# Patient Record
Sex: Female | Born: 1965 | Race: Black or African American | Hispanic: No | Smoking: Never smoker
Health system: Southern US, Community
[De-identification: ages and names within clinical notes are randomized; demographics above are authoritative.]

## PROBLEM LIST (undated history)

## (undated) ENCOUNTER — Ambulatory Visit (HOSPITAL_COMMUNITY): Admission: EM | Disposition: A | Discharge status: Other Acute Inpatient Hospital | Payer: BLUE CROSS/BLUE SHIELD

## (undated) DIAGNOSIS — F329 Major depressive disorder, single episode, unspecified: Secondary | ICD-10-CM

## (undated) DIAGNOSIS — F319 Bipolar disorder, unspecified: Secondary | ICD-10-CM

## (undated) DIAGNOSIS — F32A Depression, unspecified: Secondary | ICD-10-CM

## (undated) DIAGNOSIS — S83289A Other tear of lateral meniscus, current injury, unspecified knee, initial encounter: Secondary | ICD-10-CM

## (undated) DIAGNOSIS — I10 Essential (primary) hypertension: Secondary | ICD-10-CM

## (undated) DIAGNOSIS — F909 Attention-deficit hyperactivity disorder, unspecified type: Secondary | ICD-10-CM

## (undated) HISTORY — PX: THROAT SURGERY: SHX803

---
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
## 1999-08-21 ENCOUNTER — Emergency Department (HOSPITAL_COMMUNITY): Admission: EM | Admit: 1999-08-21 | Discharge: 1999-08-21 | Payer: Self-pay | Admitting: Emergency Medicine

## 2000-03-09 ENCOUNTER — Emergency Department (HOSPITAL_COMMUNITY): Admission: EM | Admit: 2000-03-09 | Discharge: 2000-03-09 | Payer: Self-pay | Admitting: Emergency Medicine

## 2000-05-23 ENCOUNTER — Emergency Department (HOSPITAL_COMMUNITY): Admission: EM | Admit: 2000-05-23 | Discharge: 2000-05-23 | Payer: Self-pay

## 2000-08-13 ENCOUNTER — Emergency Department (HOSPITAL_COMMUNITY): Admission: EM | Admit: 2000-08-13 | Discharge: 2000-08-13 | Payer: Self-pay | Admitting: Emergency Medicine

## 2001-02-13 ENCOUNTER — Emergency Department (HOSPITAL_COMMUNITY): Admission: EM | Admit: 2001-02-13 | Discharge: 2001-02-13 | Payer: Self-pay

## 2001-05-16 ENCOUNTER — Emergency Department (HOSPITAL_COMMUNITY): Admission: EM | Admit: 2001-05-16 | Discharge: 2001-05-16 | Payer: Self-pay | Admitting: *Deleted

## 2001-09-29 ENCOUNTER — Emergency Department (HOSPITAL_COMMUNITY): Admission: EM | Admit: 2001-09-29 | Discharge: 2001-09-29 | Payer: Self-pay | Admitting: *Deleted

## 2003-11-21 ENCOUNTER — Emergency Department (HOSPITAL_COMMUNITY): Admission: EM | Admit: 2003-11-21 | Discharge: 2003-11-21 | Payer: Self-pay | Admitting: Emergency Medicine

## 2004-11-19 ENCOUNTER — Emergency Department (HOSPITAL_COMMUNITY): Admission: EM | Admit: 2004-11-19 | Discharge: 2004-11-19 | Payer: Self-pay | Admitting: Emergency Medicine

## 2004-11-21 ENCOUNTER — Emergency Department (HOSPITAL_COMMUNITY): Admission: EM | Admit: 2004-11-21 | Discharge: 2004-11-21 | Payer: Self-pay | Admitting: Family Medicine

## 2004-12-08 ENCOUNTER — Ambulatory Visit: Payer: Self-pay | Admitting: *Deleted

## 2004-12-08 ENCOUNTER — Emergency Department (HOSPITAL_COMMUNITY): Admission: EM | Admit: 2004-12-08 | Discharge: 2004-12-08 | Payer: Self-pay | Admitting: Family Medicine

## 2004-12-12 ENCOUNTER — Emergency Department (HOSPITAL_COMMUNITY): Admission: EM | Admit: 2004-12-12 | Discharge: 2004-12-12 | Payer: Self-pay | Admitting: Emergency Medicine

## 2004-12-21 ENCOUNTER — Ambulatory Visit: Payer: Self-pay | Admitting: Internal Medicine

## 2005-01-19 ENCOUNTER — Emergency Department (HOSPITAL_COMMUNITY): Admission: EM | Admit: 2005-01-19 | Discharge: 2005-01-19 | Payer: Self-pay | Admitting: Family Medicine

## 2005-02-01 ENCOUNTER — Ambulatory Visit: Payer: Self-pay | Admitting: Family Medicine

## 2005-08-18 ENCOUNTER — Emergency Department (HOSPITAL_COMMUNITY): Admission: EM | Admit: 2005-08-18 | Discharge: 2005-08-18 | Payer: Self-pay | Admitting: Emergency Medicine

## 2005-12-12 ENCOUNTER — Emergency Department (HOSPITAL_COMMUNITY): Admission: EM | Admit: 2005-12-12 | Discharge: 2005-12-12 | Payer: Self-pay | Admitting: Emergency Medicine

## 2005-12-22 ENCOUNTER — Emergency Department (HOSPITAL_COMMUNITY): Admission: EM | Admit: 2005-12-22 | Discharge: 2005-12-22 | Payer: Self-pay | Admitting: Family Medicine

## 2006-04-04 ENCOUNTER — Ambulatory Visit: Payer: Self-pay | Admitting: Family Medicine

## 2006-04-20 ENCOUNTER — Encounter: Admission: RE | Admit: 2006-04-20 | Discharge: 2006-04-20 | Payer: Self-pay | Admitting: Family Medicine

## 2006-07-03 ENCOUNTER — Emergency Department (HOSPITAL_COMMUNITY): Admission: EM | Admit: 2006-07-03 | Discharge: 2006-07-03 | Payer: Self-pay | Admitting: Family Medicine

## 2006-07-31 ENCOUNTER — Emergency Department (HOSPITAL_COMMUNITY): Admission: EM | Admit: 2006-07-31 | Discharge: 2006-07-31 | Payer: Self-pay | Admitting: Emergency Medicine

## 2006-08-02 ENCOUNTER — Emergency Department (HOSPITAL_COMMUNITY): Admission: EM | Admit: 2006-08-02 | Discharge: 2006-08-02 | Payer: Self-pay | Admitting: Emergency Medicine

## 2006-09-04 ENCOUNTER — Emergency Department (HOSPITAL_COMMUNITY): Admission: EM | Admit: 2006-09-04 | Discharge: 2006-09-04 | Payer: Self-pay | Admitting: Family Medicine

## 2006-11-04 ENCOUNTER — Emergency Department (HOSPITAL_COMMUNITY): Admission: EM | Admit: 2006-11-04 | Discharge: 2006-11-04 | Payer: Self-pay | Admitting: Family Medicine

## 2006-12-24 ENCOUNTER — Emergency Department (HOSPITAL_COMMUNITY): Admission: EM | Admit: 2006-12-24 | Discharge: 2006-12-24 | Payer: Self-pay | Admitting: Emergency Medicine

## 2007-02-06 ENCOUNTER — Encounter (INDEPENDENT_AMBULATORY_CARE_PROVIDER_SITE_OTHER): Payer: Self-pay | Admitting: *Deleted

## 2007-06-12 ENCOUNTER — Emergency Department (HOSPITAL_COMMUNITY): Admission: EM | Admit: 2007-06-12 | Discharge: 2007-06-12 | Payer: Self-pay | Admitting: Family Medicine

## 2007-09-04 ENCOUNTER — Encounter: Payer: Self-pay | Admitting: Internal Medicine

## 2007-09-04 ENCOUNTER — Ambulatory Visit: Payer: Self-pay | Admitting: Internal Medicine

## 2007-09-04 LAB — CONVERTED CEMR LAB
Amphetamine Screen, Ur: NEGATIVE
Barbiturate Quant, Ur: NEGATIVE
Benzodiazepines.: NEGATIVE
Chlamydia, DNA Probe: NEGATIVE
Cocaine Metabolites: NEGATIVE
GC Probe Amp, Genital: NEGATIVE
Marijuana Metabolite: NEGATIVE
Methadone: NEGATIVE

## 2007-09-11 ENCOUNTER — Ambulatory Visit (HOSPITAL_COMMUNITY): Admission: RE | Admit: 2007-09-11 | Discharge: 2007-09-11 | Payer: Self-pay | Admitting: Internal Medicine

## 2007-11-01 ENCOUNTER — Emergency Department (HOSPITAL_COMMUNITY): Admission: EM | Admit: 2007-11-01 | Discharge: 2007-11-01 | Payer: Self-pay | Admitting: Family Medicine

## 2008-11-04 ENCOUNTER — Ambulatory Visit: Payer: Self-pay | Admitting: Internal Medicine

## 2008-11-06 ENCOUNTER — Ambulatory Visit: Payer: Self-pay | Admitting: Internal Medicine

## 2010-06-12 ENCOUNTER — Encounter: Payer: Self-pay | Admitting: Internal Medicine

## 2010-08-12 ENCOUNTER — Ambulatory Visit (INDEPENDENT_AMBULATORY_CARE_PROVIDER_SITE_OTHER): Payer: Self-pay

## 2010-08-12 ENCOUNTER — Inpatient Hospital Stay (INDEPENDENT_AMBULATORY_CARE_PROVIDER_SITE_OTHER)
Admission: RE | Admit: 2010-08-12 | Discharge: 2010-08-12 | Disposition: A | Discharge status: Home / Self Care | Payer: Self-pay | Source: Ambulatory Visit | Attending: Emergency Medicine | Admitting: Emergency Medicine

## 2010-08-12 DIAGNOSIS — S139XXA Sprain of joints and ligaments of unspecified parts of neck, initial encounter: Secondary | ICD-10-CM

## 2010-09-22 ENCOUNTER — Inpatient Hospital Stay (INDEPENDENT_AMBULATORY_CARE_PROVIDER_SITE_OTHER)
Admission: RE | Admit: 2010-09-22 | Discharge: 2010-09-22 | Disposition: A | Discharge status: Home / Self Care | Payer: Self-pay | Source: Ambulatory Visit | Attending: Emergency Medicine | Admitting: Emergency Medicine

## 2010-09-22 DIAGNOSIS — L259 Unspecified contact dermatitis, unspecified cause: Secondary | ICD-10-CM

## 2010-10-10 ENCOUNTER — Other Ambulatory Visit (HOSPITAL_COMMUNITY): Payer: Self-pay | Admitting: Family Medicine

## 2010-10-10 ENCOUNTER — Other Ambulatory Visit: Payer: Self-pay | Admitting: Family Medicine

## 2010-10-10 DIAGNOSIS — Z1231 Encounter for screening mammogram for malignant neoplasm of breast: Secondary | ICD-10-CM

## 2010-10-26 ENCOUNTER — Ambulatory Visit (HOSPITAL_COMMUNITY): Payer: Self-pay

## 2011-06-09 ENCOUNTER — Other Ambulatory Visit (HOSPITAL_COMMUNITY): Payer: Self-pay | Admitting: Physician Assistant

## 2011-06-09 DIAGNOSIS — Z1231 Encounter for screening mammogram for malignant neoplasm of breast: Secondary | ICD-10-CM

## 2011-07-06 ENCOUNTER — Ambulatory Visit (HOSPITAL_COMMUNITY)
Admission: RE | Admit: 2011-07-06 | Discharge: 2011-07-06 | Disposition: A | Discharge status: Home / Self Care | Payer: Self-pay | Source: Ambulatory Visit | Attending: Physician Assistant | Admitting: Physician Assistant

## 2011-07-06 DIAGNOSIS — Z1231 Encounter for screening mammogram for malignant neoplasm of breast: Secondary | ICD-10-CM | POA: Insufficient documentation

## 2011-08-06 ENCOUNTER — Encounter (HOSPITAL_COMMUNITY): Payer: Self-pay

## 2011-08-06 ENCOUNTER — Emergency Department (INDEPENDENT_AMBULATORY_CARE_PROVIDER_SITE_OTHER)
Admission: EM | Admit: 2011-08-06 | Discharge: 2011-08-06 | Disposition: A | Discharge status: Nursing Home (Includes ICF) | Payer: Self-pay | Source: Home / Self Care

## 2011-08-06 ENCOUNTER — Encounter (HOSPITAL_COMMUNITY): Payer: Self-pay | Admitting: Emergency Medicine

## 2011-08-06 ENCOUNTER — Emergency Department (HOSPITAL_COMMUNITY)
Admission: EM | Admit: 2011-08-06 | Discharge: 2011-08-06 | Disposition: A | Discharge status: Home / Self Care | Payer: Self-pay | Attending: Emergency Medicine | Admitting: Emergency Medicine

## 2011-08-06 DIAGNOSIS — N611 Abscess of the breast and nipple: Secondary | ICD-10-CM

## 2011-08-06 DIAGNOSIS — N61 Mastitis without abscess: Secondary | ICD-10-CM | POA: Insufficient documentation

## 2011-08-06 MED ORDER — CEPHALEXIN 500 MG PO CAPS: 500.0000 mg | ORAL_CAPSULE | Freq: Four times a day (QID) | ORAL | Status: AC

## 2011-08-06 MED ORDER — TRAMADOL HCL 50 MG PO TABS: 50.0000 mg | ORAL_TABLET | Freq: Four times a day (QID) | ORAL | Status: AC | PRN

## 2011-08-06 MED ORDER — MINOCYCLINE HCL 100 MG PO CAPS: 100.0000 mg | ORAL_CAPSULE | Freq: Two times a day (BID) | ORAL | Status: AC

## 2011-08-06 NOTE — ED Provider Notes (Signed)
History     CSN: [id number]  Arrival date & time 08/06/11  7829   First MD Initiated Contact with Patient 08/06/11 2012      Chief Complaint  Patient presents with  . Recurrent Skin Infections    Left nipple    (Consider location/radiation/quality/duration/timing/severity/associated sxs/prior treatment) Patient is a 46 y.o. female presenting with abscess.  Abscess  This is a recurrent problem. The current episode started yesterday. The problem occurs continuously. The problem has been gradually worsening. The problem is mild. The abscess is characterized by redness and painfulness. The abscess first occurred at home. Pertinent negatives include no fever.   patient reports onset of what she believes to be an abscess to her left nipple yesterday.Marland Kitchen Has become increasingly painful and has not responded to warm compresses at home. The patient was seen at urgent care today and sent here for further evaluation.  History reviewed. No pertinent past medical history.  History reviewed. No pertinent past surgical history.  No family history on file.  History  Substance Use Topics  . Smoking status: Never Smoker   . Smokeless tobacco: Not on file  . Alcohol Use: No    OB History    Grav Para Term Preterm Abortions TAB SAB Ect Mult Living                  Review of Systems  Constitutional: Negative.  Negative for fever.  HENT: Negative.   Eyes: Negative.   Respiratory: Negative.   Cardiovascular: Negative.   Gastrointestinal: Negative.   Genitourinary: Negative.   Musculoskeletal: Negative.   Skin: Negative.   Neurological: Negative.   Hematological: Negative.   Psychiatric/Behavioral: Negative.     Allergies  Review of patient's allergies indicates no known allergies.  Home Medications   Current Outpatient Rx  Name Route Sig Dispense Refill  . ASPIRIN 325 MG PO TABS Oral Take 325 mg by mouth daily.    Carma Leaven M PLUS PO TABS Oral Take 1 tablet by mouth daily.       BP 154/107  Pulse 78  Temp(Src) 98 F (36.7 C) (Oral)  Resp 18  SpO2 96%  Physical Exam  Constitutional: She is oriented to person, place, and time. She appears well-developed and well-nourished.  HENT:  Head: Normocephalic and atraumatic.  Eyes: Conjunctivae are normal.  Neck: Neck supple.  Cardiovascular: Normal rate.   Pulmonary/Chest: Effort normal.  Genitourinary: There is breast tenderness. No breast discharge.       (L) nipple noted to be mildly swollen with associated erythema. There is also mild erythema to areola and well. There is no firm, indurated areas noted and the only area of mild fluctuance is the nipple itself. Findings c/w early abscess to (L) nipple/areola.  Musculoskeletal: Normal range of motion.  Neurological: She is alert and oriented to person, place, and time.  Skin: Skin is warm and dry. No erythema.  Psychiatric: She has a normal mood and affect.    ED Course  Procedures   Findings and clinical impression discussed with patient. Given the location of findings I discussed patient with Dr. Ignacia Palma. Will plan for discharge home on medicine and Keflex and provide surgical referral for follow up. Patient agreeable with plan.    1. Abscess of left breast       MDM  HPI/PE and clinical findings c/w 1. Early abscess and cellulitis of (L) nipple/areola (Given location, will tx w/ Keflex and Minocin and provide sutrgical f/u)    Medical  screening examination/treatment/procedure(s) were performed by non-physician practitioner and as supervising physician I was immediately available for consultation/collaboration. Case and management discussed with Mrs. Wellsite geologist. Osvaldo Human, M.D.     Roma Kayser Schorr, NP 08/06/11 7829  Carleene Cooper III, MD 08/08/11 [phone]

## 2011-08-06 NOTE — Discharge Instructions (Signed)
Please review the instructions below. Because of the area in which your abscess is located we are treating with antibiotics. Continue warm compresses for 20-30 minutes several times a day. Take pain medicine as directed if you need it. If this abscess/cellulitis persist you will need to arrange follow up with a surgeon for further evaluation. Return for worsening symptoms.

## 2011-08-06 NOTE — ED Provider Notes (Signed)
History     CSN: [id number]  Arrival date & time 08/06/11  1804   None     Chief Complaint  Patient presents with  . Recurrent Skin Infections    abcess on left breast    (Consider location/radiation/quality/duration/timing/severity/associated sxs/prior treatment) HPI Comments: Pt noticed soreness in L nipple area of breast on 3/15.  Has progressively worsened.  Pt thinks she has abscess.   Patient is a 46 y.o. female presenting with abscess. The history is provided by the patient.  Abscess  This is a new problem. Episode onset: 2 days ago. The problem has been gradually worsening. Affected Location: L breast/nipple. The problem is moderate. The abscess is characterized by redness and painfulness. Pertinent negatives include no fever.    History reviewed. No pertinent past medical history.  History reviewed. No pertinent past surgical history.  History reviewed. No pertinent family history.  History  Substance Use Topics  . Smoking status: Never Smoker   . Smokeless tobacco: Not on file  . Alcohol Use: No    OB History    Grav Para Term Preterm Abortions TAB SAB Ect Mult Living                  Review of Systems  Constitutional: Negative for fever and chills.  Skin: Positive for color change.    Allergies  Review of patient's allergies indicates no known allergies.  Home Medications   Current Outpatient Rx  Name Route Sig Dispense Refill  . ASPIRIN 325 MG PO TABS Oral Take 325 mg by mouth daily.    Carma Leaven M PLUS PO TABS Oral Take 1 tablet by mouth daily.      BP 144/97  Pulse 78  Temp(Src) 97.7 F (36.5 C) (Oral)  Resp 18  SpO2 95%  Physical Exam  Constitutional: She appears well-developed and well-nourished. No distress.  Pulmonary/Chest: Effort normal. Left breast exhibits skin change and tenderness. Left breast exhibits no nipple discharge.    Skin:       Skin of L nipple/areola area slightly red, warm to touch    ED Course  Procedures  (including critical care time)  Labs Reviewed - No data to display No results found.   1. Breast abscess       MDM  Concern for nipple abscess; transfer to ED.        Cathlyn Parsons, NP 08/06/11 2006

## 2011-08-06 NOTE — ED Notes (Signed)
Report given to [facility], triage nurse. Pt transported by shuttle.

## 2011-08-06 NOTE — ED Notes (Signed)
Pt states abcess appeared last night on left breast, has used heat without relief.

## 2011-08-06 NOTE — ED Notes (Signed)
Pt transferred from urgent care for further eval of abcess to left nipple.

## 2011-08-07 NOTE — ED Provider Notes (Signed)
Medical screening examination/treatment/procedure(s) were performed by non-physician practitioner and as supervising physician I was immediately available for consultation/collaboration.   [facility]; MD   Sharin Grave, MD 08/07/11 2258

## 2013-04-28 ENCOUNTER — Ambulatory Visit: Payer: Self-pay | Attending: Internal Medicine | Admitting: Internal Medicine

## 2013-04-28 ENCOUNTER — Encounter: Payer: Self-pay | Admitting: Internal Medicine

## 2013-04-28 VITALS — BP 148/89 | HR 65 | Temp 98.3°F | Resp 16 | Ht 70.0 in | Wt 189.0 lb

## 2013-04-28 DIAGNOSIS — Z0000 Encounter for general adult medical examination without abnormal findings: Secondary | ICD-10-CM | POA: Insufficient documentation

## 2013-04-28 DIAGNOSIS — I10 Essential (primary) hypertension: Secondary | ICD-10-CM | POA: Insufficient documentation

## 2013-04-28 DIAGNOSIS — Z124 Encounter for screening for malignant neoplasm of cervix: Secondary | ICD-10-CM

## 2013-04-28 DIAGNOSIS — F909 Attention-deficit hyperactivity disorder, unspecified type: Secondary | ICD-10-CM

## 2013-04-28 LAB — CMP AND LIVER
ALT: 19 U/L (ref 0–35)
AST: 23 U/L (ref 0–37)
Albumin: 4.1 g/dL (ref 3.5–5.2)
Bilirubin, Direct: 0.1 mg/dL (ref 0.0–0.3)
CO2: 27 mEq/L (ref 19–32)
Calcium: 9.3 mg/dL (ref 8.4–10.5)
Chloride: 108 mEq/L (ref 96–112)
Creat: 1.12 mg/dL — ABNORMAL HIGH (ref 0.50–1.10)
Potassium: 4 mEq/L (ref 3.5–5.3)

## 2013-04-28 LAB — CBC WITH DIFFERENTIAL/PLATELET
Basophils Absolute: 0 10*3/uL (ref 0.0–0.1)
Basophils Relative: 0 % (ref 0–1)
Eosinophils Absolute: 0.1 10*3/uL (ref 0.0–0.7)
Eosinophils Relative: 1 % (ref 0–5)
MCH: 29.8 pg (ref 26.0–34.0)
MCHC: 33.7 g/dL (ref 30.0–36.0)
MCV: 88.6 fL (ref 78.0–100.0)
Neutrophils Relative %: 47 % (ref 43–77)
Platelets: 255 10*3/uL (ref 150–400)
RDW: 15 % (ref 11.5–15.5)

## 2013-04-28 LAB — LIPID PANEL
Cholesterol: 145 mg/dL (ref 0–200)
Total CHOL/HDL Ratio: 2.2 Ratio

## 2013-04-28 MED ORDER — CLONIDINE HCL 0.1 MG PO TABS: 0.1000 mg | ORAL_TABLET | Freq: Two times a day (BID) | ORAL | Status: DC

## 2013-04-28 NOTE — Progress Notes (Signed)
Patient ID: Brandi Collins, female   DOB: August 23, 1965, 47 y.o.   MRN: [id number] Patient Demographics  Zahara Rembert, is a 47 y.o. female  WUJ:[id number]  [id number]  DOB - [birth year]  CC: No chief complaint on file.      HPI: Shiesha Jahn is a 47 y.o. female here today to establish medical care. Patient claims that she has been very hyperactive ever since childhood and she used to be on medication for ADHD as a child. Now she is thinking she may need medication for adult ADHD. She also wants a general physical examination done today and a Pap smear. She does not have any significant past medical history. She has no other complaint today. She does not smoke cigarette she does not drink alcohol. Patient has No headache, No chest pain, No abdominal pain - No Nausea, No new weakness tingling or numbness, No Cough - SOB.  No Known Allergies History reviewed. No pertinent past medical history. Current Outpatient Prescriptions on File Prior to Visit  Medication Sig Dispense Refill  . aspirin 325 MG tablet Take 325 mg by mouth daily.      . Multiple Vitamins-Minerals (MULTIVITAMINS THER. W/MINERALS) TABS Take 1 tablet by mouth daily.       No current facility-administered medications on file prior to visit.   Family History  Problem Relation Age of Onset  . Hypertension Mother   . Heart disease Mother   . Stroke Mother   . Hypertension Daughter    History   Social History  . Marital Status: Single    Spouse Name: N/A    Number of Children: N/A  . Years of Education: N/A   Occupational History  . Not on file.   Social History Main Topics  . Smoking status: Never Smoker   . Smokeless tobacco: Not on file  . Alcohol Use: No  . Drug Use: No  . Sexual Activity:    Other Topics Concern  . Not on file   Social History Narrative  . No narrative on file    Review of Systems: Constitutional: Negative for fever, chills, diaphoresis, activity change, appetite change and  fatigue. HENT: Negative for ear pain, nosebleeds, congestion, facial swelling, rhinorrhea, neck pain, neck stiffness and ear discharge.  Eyes: Negative for pain, discharge, redness, itching and visual disturbance. Respiratory: Negative for cough, choking, chest tightness, shortness of breath, wheezing and stridor.  Cardiovascular: Negative for chest pain, palpitations and leg swelling. Gastrointestinal: Negative for abdominal distention. Genitourinary: Negative for dysuria, urgency, frequency, hematuria, flank pain, decreased urine volume, difficulty urinating and dyspareunia.  Musculoskeletal: Negative for back pain, joint swelling, arthralgia and gait problem. Neurological: Negative for dizziness, tremors, seizures, syncope, facial asymmetry, speech difficulty, weakness, light-headedness, numbness and headaches.  Hematological: Negative for adenopathy. Does not bruise/bleed easily. Psychiatric/Behavioral: Negative for hallucinations, behavioral problems, confusion, dysphoric mood, decreased concentration and agitation.    Objective:   Filed Vitals:   04/28/13 1410  BP: 148/89  Pulse: 65  Temp: 98.3 F (36.8 C)  Resp: 16    Physical Exam: Constitutional: Patient appears well-developed and well-nourished. No distress. HENT: Normocephalic, atraumatic, External right and left ear normal. Oropharynx is clear and moist.  Eyes: Conjunctivae and EOM are normal. PERRLA, no scleral icterus. Neck: Normal ROM. Neck supple. No JVD. No tracheal deviation. No thyromegaly. CVS: RRR, S1/S2 +, no murmurs, no gallops, no carotid bruit.  Pulmonary: Effort and breath sounds normal, no stridor, rhonchi, wheezes, rales.  Abdominal: Soft. BS +, no distension, tenderness,  rebound or guarding.  Musculoskeletal: Normal range of motion. No edema and no tenderness.  Lymphadenopathy: No lymphadenopathy noted, cervical, inguinal or axillary Neuro: Alert. Normal reflexes, muscle tone coordination. No cranial  nerve deficit. Skin: Skin is warm and dry. No rash noted. Not diaphoretic. No erythema. No pallor. Psychiatric: Normal mood and affect. Behavior, judgment, thought content normal. Pelvic examination: Normal female external genitalia, central cervix, negative cervical motion tenderness No results found for this basename: WBC, HGB, HCT, MCV, PLT   No results found for this basename: CREATININE, BUN, NA, K, CL, CO2    No results found for this basename: HGBA1C   Lipid Panel  No results found for this basename: chol, trig, hdl, cholhdl, vldl, ldlcalc       Assessment and plan:   1. Annual physical exam  - CMP and Liver - CBC with Differential - Lipid panel - Urinalysis, Complete - TSH  2. Pap smear for cervical cancer screening  - Cytology - PAP  3. Hyperactive adult syndrome  - cloNIDine (CATAPRES) 0.1 MG tablet; Take 1 tablet (0.1 mg total) by mouth 2 (two) times daily.  Dispense: 60 tablet; Refill: 3  4. High blood pressure  - cloNIDine (CATAPRES) 0.1 MG tablet; Take 1 tablet (0.1 mg total) by mouth 2 (two) times daily.  Dispense: 60 tablet; Refill: 3  Patient has been counseled extensively about nutrition and exercise  Follow up in 3 months or when necessary   The patient was given clear instructions to go to ER or return to medical center if symptoms don't improve, worsen or new problems develop. The patient verbalized understanding. The patient was told to call to get lab results if they haven't heard anything in the next week.     Jeanann Lewandowsky, MD, MHA, FACP, FAAP [facility], [place] [phone]   04/28/2013, 2:56 PM

## 2013-04-28 NOTE — Progress Notes (Signed)
Pt is here to establish care. Pt reports that she has a knott on her left arm that is now painful to touch. She is requesting a physical, pap smear and a MM.

## 2013-04-28 NOTE — Patient Instructions (Signed)
Clonidine extended release tablets (ADHD)  What is this medicine?  CLONIDINE (KLOE ni deen) is used to treat attention-deficit hyperactivity disorder (ADHD).  This medicine may be used for other purposes; ask your health care provider or pharmacist if you have questions.  COMMON BRAND NAME(S): Kapvay  What should I tell my health care provider before I take this medicine?  They need to know if you have any of these conditions:  -bleeding in the brain  -heart disease  -high or low blood pressure  -history of slow or irregular heartbeat  -kidney disease  -an unusual or allergic reaction to clonidine, other medicines, foods, dyes, or preservatives  -pregnant or trying to get pregnant  -breast-feeding  How should I use this medicine?  Take this medicine by mouth with a glass of water. Follow the directions on the prescription label. Do not cut, crush or chew this medicine. You can take it with or without food. If it upsets your stomach, take it with food. Take your doses at regular intervals. Do not take your medicine more often than directed. Do not suddenly stop taking this medicine. You must gradually reduce the dose. Ask your doctor or health care professional for advice.  Talk to your pediatrician regarding the use of this medicine in children. While this drug may be prescribed for children as young as 6 years for selected conditions, precautions do apply.  Overdosage: If you think you've taken too much of this medicine contact a poison control center or emergency room at once.  Overdosage: If you think you have taken too much of this medicine contact a poison control center or emergency room at once.  NOTE: This medicine is only for you. Do not share this medicine with others.  What if I miss a dose?  If you miss a dose, skip the missed dose. Take the next dose at your regular time. Do not take double or extra doses.  What may interact with this medicine?  Do not take this medicine with any of the following  medications:  -MAOIs like Carbex, Eldepryl, Marplan, Nardil, and Parnate   This medicine may also interact with the following medications:  -alcohol  -certain medicines for seizures like phenobarbital  -certain medicines for blood pressure, heart disease, irregular heart beat  -certain medicines for depression, anxiety, or psychotic disturbances  -medicines for sleep  This list may not describe all possible interactions. Give your health care provider a list of all the medicines, herbs, non-prescription drugs, or dietary supplements you use. Also tell them if you smoke, drink alcohol, or use illegal drugs. Some items may interact with your medicine.  What should I watch for while using this medicine?  Visit your doctor or health care professional for regular checks on your progress. Check your heart rate and blood pressure regularly while you are taking this medicine. Ask your doctor or health care professional what your heart rate should be and when you should contact him or her.  You may get drowsy or dizzy. Do not drive, use machinery, or do anything that needs mental alertness until you know how this medicine affects you. To avoid dizzy or fainting spells, do not stand or sit up quickly. Alcohol can make you more drowsy and dizzy. Avoid alcoholic drinks.  Avoid becoming dehydrated or overheated.  Do not stop taking except on your doctor's advice. You may develop a severe reaction. Your doctor will tell you how much medicine to take.  Your mouth may get dry.   soon as possible: -allergic reactions like skin rash, itching or hives, swelling of the face, lips, or tongue -anxious or change in mood -increased body temperature -low blood pressure -unusually slow heartbeat -unusually weak  or tired  Side effects that usually do not require medical attention (Report these to your doctor or health care professional if they continue or are bothersome.): -constipation -cough, stuffy or runny nose, sore throat, or sneezing -dry mouth -ear pain -headache -nightmares or trouble sleeping -tiredness This list may not describe all possible side effects. Call your doctor for medical advice about side effects. You may report side effects to FDA at 1-800-FDA-1088. Where should I keep my medicine? Keep out of the reach of children. Store at room temperature between 20 and 25 degrees C (68 and 77 degrees F). Protect from light. Keep container tightly closed. Throw away any unused medicine after the expiration date. NOTE: This sheet is a summary. It may not cover all possible information. If you have questions about this medicine, talk to your doctor, pharmacist, or health care provider.  2014, Elsevier/Gold Standard. (2009-03-31 13:56:16) Attention Deficit Hyperactivity Disorder Attention deficit hyperactivity disorder (ADHD) is a problem with behavior issues based on the way the brain functions (neurobehavioral disorder). It is a common reason for behavior and academic problems in school. CAUSES  The cause of ADHD is unknown in most cases. It may run in families. It sometimes can be associated with learning disabilities and other behavioral problems. SYMPTOMS  There are 3 types of ADHD. The 3 types and some of the symptoms include:  Inattentive  Gets bored or distracted easily.  Loses or forgets things. Forgets to hand in homework.  Has trouble organizing or completing tasks.  Difficulty staying on task.  An inability to organize daily tasks and school work.  Leaving projects, chores, or homework unfinished.  Trouble paying attention or responding to details. Careless mistakes.  Difficulty following directions. Often seems like is not listening.  Dislikes activities that  require sustained attention (like chores or homework).  Hyperactive-impulsive  Feels like it is impossible to sit still or stay in a seat. Fidgeting with hands and feet.  Trouble waiting turn.  Talking too much or out of turn. Interruptive.  Speaks or acts impulsively.  Aggressive, disruptive behavior.  Constantly busy or on the go, noisy.  Combined  Has symptoms of both of the above. Often children with ADHD feel discouraged about themselves and with school. They often perform well below their abilities in school. These symptoms can cause problems in home, school, and in relationships with peers. As children get older, the excess motor activities can calm down, but the problems with paying attention and staying organized persist. Most children do not outgrow ADHD but with good treatment can learn to cope with the symptoms. DIAGNOSIS  When ADHD is suspected, the diagnosis should be made by professionals trained in ADHD.  Diagnosis will include:  Ruling out other reasons for the child's behavior.  The caregivers will check with the child's school and check their medical records.  They will talk to teachers and parents.  Behavior rating scales for the child will be filled out by those dealing with the child on a daily basis. A diagnosis is made only after all information has been considered. TREATMENT  Treatment usually includes behavioral treatment often along with medicines. It may include stimulant medicines. The stimulant medicines decrease impulsivity and hyperactivity and increase attention. Other medicines used include antidepressants and certain blood pressure medicines. Most  experts agree that treatment for ADHD should address all aspects of the child's functioning. Treatment should not be limited to the use of medicines alone. Treatment should include structured classroom management. The parents must receive education to address rewarding good behavior, discipline, and  limit-setting. Tutoring or behavioral therapy or both should be available for the child. If untreated, the disorder can have long-term serious effects into adolescence and adulthood. HOME CARE INSTRUCTIONS   Often with ADHD there is a lot of frustration among the family in dealing with the illness. There is often blame and anger that is not warranted. This is a life long illness. There is no way to prevent ADHD. In many cases, because the problem affects the family as a whole, the entire family may need help. A therapist can help the family find better ways to handle the disruptive behaviors and promote change. If the child is young, most of the therapist's work is with the parents. Parents will learn techniques for coping with and improving their child's behavior. Sometimes only the child with the ADHD needs counseling. Your caregivers can help you make these decisions.  Children with ADHD may need help in organizing. Some helpful tips include:  Keep routines the same every day from wake-up time to bedtime. Schedule everything. This includes homework and playtime. This should include outdoor and indoor recreation. Keep the schedule on the refrigerator or a bulletin board where it is frequently seen. Mark schedule changes as far in advance as possible.  Have a place for everything and keep everything in its place. This includes clothing, backpacks, and school supplies.  Encourage writing down assignments and bringing home needed books.  Offer your child a well-balanced diet. Breakfast is especially important for school performance. Children should avoid drinks with caffeine including:  Soft drinks.  Coffee.  Tea.  However, some older children (adolescents) may find these drinks helpful in improving their attention.  Children with ADHD need consistent rules that they can understand and follow. If rules are followed, give small rewards. Children with ADHD often receive, and expect, criticism.  Look for good behavior and praise it. Set realistic goals. Give clear instructions. Look for activities that can foster success and self-esteem. Make time for pleasant activities with your child. Give lots of affection.  Parents are their children's greatest advocates. Learn as much as possible about ADHD. This helps you become a stronger and better advocate for your child. It also helps you educate your child's teachers and instructors if they feel inadequate in these areas. Parent support groups are often helpful. A national group with local chapters is called CHADD (Children and Adults with Attention Deficit Hyperactivity Disorder). PROGNOSIS  There is no cure for ADHD. Children with the disorder seldom outgrow it. Many find adaptive ways to accommodate the ADHD as they mature. SEEK MEDICAL CARE IF:  Your child has repeated muscle twitches, cough or speech outbursts.  Your child has sleep problems.  Your child has a marked loss of appetite.  Your child develops depression.  Your child has new or worsening behavioral problems.  Your child develops dizziness.  Your child has a racing heart.  Your child has stomach pains.  Your child develops headaches. Document Released: 04/28/2002 Document Revised: 07/31/2011 Document Reviewed: 11/27/2012 [employer] [facility] Patient Information 2014 [place], [place].

## 2013-04-29 LAB — URINALYSIS, COMPLETE
Crystals: NONE SEEN
Glucose, UA: NEGATIVE mg/dL
Leukocytes, UA: NEGATIVE
Protein, ur: NEGATIVE mg/dL
Specific Gravity, Urine: 1.014 (ref 1.005–1.030)
Squamous Epithelial / LPF: NONE SEEN
pH: 7.5 (ref 5.0–8.0)

## 2013-05-06 ENCOUNTER — Telehealth: Payer: Self-pay | Admitting: *Deleted

## 2013-05-06 NOTE — Telephone Encounter (Signed)
Contacted pt to inform her that her results are all normal and her Pap smear is negative for malignancy. Pt requests a referral for a mammogram and a psychiatrist. Pt was also informed that Arna Medici will be completed those referrals for her. Telephone call completed with no problems.

## 2013-05-30 ENCOUNTER — Ambulatory Visit: Payer: No Typology Code available for payment source | Attending: Internal Medicine

## 2013-06-16 ENCOUNTER — Telehealth: Payer: Self-pay | Admitting: Emergency Medicine

## 2013-06-16 ENCOUNTER — Telehealth: Payer: Self-pay | Admitting: Internal Medicine

## 2013-06-16 DIAGNOSIS — F909 Attention-deficit hyperactivity disorder, unspecified type: Secondary | ICD-10-CM

## 2013-06-16 NOTE — Telephone Encounter (Signed)
Left message for pt to call clinic

## 2013-06-16 NOTE — Telephone Encounter (Signed)
Pt received results for tests and says she would like referrals to have a mammogram, a psychiatrist (for ADHD), and dental clinic. She says she was waiting for results on tests before asking for referrals.  Please f/u with pt.

## 2013-06-16 NOTE — Telephone Encounter (Signed)
Spoke with pt and gave referral information.

## 2013-06-16 NOTE — Telephone Encounter (Signed)
Left message to call clinic. Referral placed for Psychiatry and mammogram per Dr. Hyman HopesJegede

## 2013-07-28 ENCOUNTER — Ambulatory Visit: Payer: Self-pay | Admitting: Internal Medicine

## 2013-08-04 ENCOUNTER — Ambulatory Visit: Payer: No Typology Code available for payment source | Attending: Internal Medicine

## 2013-08-06 ENCOUNTER — Other Ambulatory Visit: Payer: Self-pay

## 2013-08-06 ENCOUNTER — Ambulatory Visit
Admission: RE | Admit: 2013-08-06 | Discharge: 2013-08-06 | Disposition: A | Discharge status: Home / Self Care | Payer: No Typology Code available for payment source | Source: Ambulatory Visit | Attending: Internal Medicine | Admitting: Internal Medicine

## 2013-08-06 ENCOUNTER — Ambulatory Visit
Admission: RE | Admit: 2013-08-06 | Discharge: 2013-08-06 | Disposition: A | Discharge status: Home / Self Care | Payer: No Typology Code available for payment source | Source: Ambulatory Visit

## 2013-08-06 DIAGNOSIS — Z1231 Encounter for screening mammogram for malignant neoplasm of breast: Secondary | ICD-10-CM

## 2013-08-06 DIAGNOSIS — F909 Attention-deficit hyperactivity disorder, unspecified type: Secondary | ICD-10-CM

## 2013-09-03 ENCOUNTER — Telehealth: Payer: Self-pay | Admitting: *Deleted

## 2013-09-03 ENCOUNTER — Telehealth: Payer: Self-pay | Admitting: Internal Medicine

## 2013-09-03 NOTE — Telephone Encounter (Signed)
Pt calling upset, says that since she has been taking meds her eyes have started crossing. Please f/u with pt.

## 2013-09-04 ENCOUNTER — Encounter: Payer: Self-pay | Admitting: Internal Medicine

## 2013-09-04 ENCOUNTER — Ambulatory Visit: Payer: No Typology Code available for payment source | Attending: Internal Medicine | Admitting: Internal Medicine

## 2013-09-04 ENCOUNTER — Ambulatory Visit (HOSPITAL_COMMUNITY): Payer: No Typology Code available for payment source

## 2013-09-04 VITALS — BP 157/90 | HR 78 | Temp 98.2°F | Wt 184.2 lb

## 2013-09-04 DIAGNOSIS — H53469 Homonymous bilateral field defects, unspecified side: Secondary | ICD-10-CM | POA: Insufficient documentation

## 2013-09-04 DIAGNOSIS — H5500 Unspecified nystagmus: Secondary | ICD-10-CM | POA: Insufficient documentation

## 2013-09-04 DIAGNOSIS — F1410 Cocaine abuse, uncomplicated: Secondary | ICD-10-CM | POA: Insufficient documentation

## 2013-09-04 DIAGNOSIS — Z7982 Long term (current) use of aspirin: Secondary | ICD-10-CM | POA: Insufficient documentation

## 2013-09-04 DIAGNOSIS — F909 Attention-deficit hyperactivity disorder, unspecified type: Secondary | ICD-10-CM | POA: Insufficient documentation

## 2013-09-04 DIAGNOSIS — F1210 Cannabis abuse, uncomplicated: Secondary | ICD-10-CM | POA: Insufficient documentation

## 2013-09-04 DIAGNOSIS — I10 Essential (primary) hypertension: Secondary | ICD-10-CM | POA: Insufficient documentation

## 2013-09-04 LAB — CBC WITH DIFFERENTIAL/PLATELET
Basophils Absolute: 0 10*3/uL (ref 0.0–0.1)
Basophils Relative: 0 % (ref 0–1)
EOS ABS: 0.1 10*3/uL (ref 0.0–0.7)
EOS PCT: 1 % (ref 0–5)
HEMATOCRIT: 45.2 % (ref 36.0–46.0)
HEMOGLOBIN: 14.9 g/dL (ref 12.0–15.0)
LYMPHS ABS: 3.2 10*3/uL (ref 0.7–4.0)
LYMPHS PCT: 50 % — AB (ref 12–46)
MCH: 28.9 pg (ref 26.0–34.0)
MCHC: 33 g/dL (ref 30.0–36.0)
MCV: 87.6 fL (ref 78.0–100.0)
MONO ABS: 0.5 10*3/uL (ref 0.1–1.0)
MONOS PCT: 8 % (ref 3–12)
Neutro Abs: 2.6 10*3/uL (ref 1.7–7.7)
Neutrophils Relative %: 41 % — ABNORMAL LOW (ref 43–77)
PLATELETS: 223 10*3/uL (ref 150–400)
RBC: 5.16 MIL/uL — AB (ref 3.87–5.11)
RDW: 15.4 % (ref 11.5–15.5)
WBC: 6.3 10*3/uL (ref 4.0–10.5)

## 2013-09-04 LAB — COMPLETE METABOLIC PANEL WITH GFR
ALK PHOS: 69 U/L (ref 39–117)
ALT: 16 U/L (ref 0–35)
AST: 16 U/L (ref 0–37)
Albumin: 4.5 g/dL (ref 3.5–5.2)
BUN: 6 mg/dL (ref 6–23)
CALCIUM: 9.5 mg/dL (ref 8.4–10.5)
CHLORIDE: 106 meq/L (ref 96–112)
CO2: 32 mEq/L (ref 19–32)
CREATININE: 0.91 mg/dL (ref 0.50–1.10)
GFR, Est African American: 86 mL/min
GFR, Est Non African American: 74 mL/min
Glucose, Bld: 81 mg/dL (ref 70–99)
Potassium: 4.3 mEq/L (ref 3.5–5.3)
Sodium: 144 mEq/L (ref 135–145)
Total Bilirubin: 0.3 mg/dL (ref 0.2–1.2)
Total Protein: 7.2 g/dL (ref 6.0–8.3)

## 2013-09-04 MED ORDER — ASPIRIN EC 81 MG PO TBEC: 81.0000 mg | DELAYED_RELEASE_TABLET | Freq: Every day | ORAL | Status: DC

## 2013-09-04 MED ORDER — HYDROCHLOROTHIAZIDE 12.5 MG PO TABS: 12.5000 mg | ORAL_TABLET | Freq: Every day | ORAL | Status: DC

## 2013-09-04 NOTE — Progress Notes (Signed)
Subjective:     Patient ID: Brandi Collins, female   DOB: [birth year], 48 y.o.   MRN: [id number]  HPIpatient here for possible reaction to clonodine   Review of Systems     Objective:   Physical Exam     Assessment:       Plan:

## 2013-09-04 NOTE — Progress Notes (Signed)
   Subjective:    Patient ID: Brandi Collins, female    DOB: 12-22-1965, 48 y.o.   MRN: [id number]  HPI    Review of Systems     Objective:   Physical Exam        Assessment & Plan:

## 2013-09-04 NOTE — Progress Notes (Signed)
Patient is here today for a possible reaction to colondine. She has not taken it for 2 days now. Since taking the medication she has been having "eye crossing" and knee and leg pain and cramping. No SOB. She will like to be put ion another type of medication

## 2013-09-04 NOTE — Progress Notes (Signed)
Patient ID: Brandi Collins, female   DOB: [birth year], 48 y.o.   MRN: [id number]   CC:  HPI: 48 year old female with a history of ADHD, hypertension, prior history of marijuana and cocaine use, comes to the clinic for evaluation of her nystagmus. The patient describes left hemianopsia and left-sided nystagmus. Started after the patient was placed on clonidine on 3/26 for ADHD as well as hypertension. Patient has not taken her medication in 2 days. She also complains of feeling tired, no nausea vomiting no headache no blurry vision  No Known Allergies No past medical history on file. Current Outpatient Prescriptions on File Prior to Visit  Medication Sig Dispense Refill  . aspirin 325 MG tablet Take 325 mg by mouth daily.      . Multiple Vitamins-Minerals (MULTIVITAMINS THER. W/MINERALS) TABS Take 1 tablet by mouth daily.       No current facility-administered medications on file prior to visit.   Family History  Problem Relation Age of Onset  . Hypertension Mother   . Heart disease Mother   . Stroke Mother   . Hypertension Daughter    History   Social History  . Marital Status: Single    Spouse Name: N/A    Number of Children: N/A  . Years of Education: N/A   Occupational History  . Not on file.   Social History Main Topics  . Smoking status: Never Smoker   . Smokeless tobacco: Not on file  . Alcohol Use: No  . Drug Use: No  . Sexual Activity:    Other Topics Concern  . Not on file   Social History Narrative  . No narrative on file    Review of Systems  Constitutional: Negative for fever, chills, diaphoresis, activity change, appetite change and fatigue.  HENT: Negative for ear pain, nosebleeds, congestion, facial swelling, rhinorrhea, neck pain, neck stiffness and ear discharge.   Eyes: Negative for pain, discharge, redness, itching and visual disturbance.  Respiratory: Negative for cough, choking, chest tightness, shortness of breath, wheezing and stridor.    Cardiovascular: Negative for chest pain, palpitations and leg swelling.  Gastrointestinal: Negative for abdominal distention.  Genitourinary: Negative for dysuria, urgency, frequency, hematuria, flank pain, decreased urine volume, difficulty urinating and dyspareunia.  Musculoskeletal: Negative for back pain, joint swelling, arthralgias and gait problem.  Neurological: As in history of present illness Hematological: Negative for adenopathy. Does not bruise/bleed easily.  Psychiatric/Behavioral: Negative for hallucinations, behavioral problems, confusion, dysphoric mood, decreased concentration and agitation.    Objective:   Filed Vitals:   09/04/13 1542  BP: 157/90  Pulse: 78  Temp: 98.2 F (36.8 C)    Physical Exam  Constitutional: Appears well-developed and well-nourished. No distress.  HENT: Normocephalic. External right and left ear normal. Oropharynx is clear and moist.  Eyes: Conjunctivae and EOM are normal. PERRLA, no scleral icterus.  Neck: Normal ROM. Neck supple. No JVD. No tracheal deviation. No thyromegaly.  CVS: RRR, S1/S2 +, no murmurs, no gallops, no carotid bruit.  Pulmonary: Effort and breath sounds normal, no stridor, rhonchi, wheezes, rales.  Abdominal: Soft. BS +,  no distension, tenderness, rebound or guarding.  Musculoskeletal: Normal range of motion. No edema and no tenderness.  Lymphadenopathy: No lymphadenopathy noted, cervical, inguinal. Neuro: Alert. Normal reflexes, muscle tone coordination. No cranial nerve deficit. Skin: Skin is warm and dry. No rash noted. Not diaphoretic. No erythema. No pallor.  Psychiatric: Normal mood and affect. Behavior, judgment, thought content normal.   Lab Results  Component Value  Date   WBC 6.7 04/28/2013   HGB 14.1 04/28/2013   HCT 41.9 04/28/2013   MCV 88.6 04/28/2013   PLT 255 04/28/2013   Lab Results  Component Value Date   CREATININE 1.12* 04/28/2013   BUN 10 04/28/2013   NA 142 04/28/2013   K 4.0 04/28/2013   CL  108 04/28/2013   CO2 27 04/28/2013    No results found for this basename: HGBA1C   Lipid Panel     Component Value Date/Time   CHOL 145 04/28/2013 1452   TRIG 51 04/28/2013 1452   HDL 66 04/28/2013 1452   CHOLHDL 2.2 04/28/2013 1452   VLDL 10 04/28/2013 1452   LDLCALC 69 04/28/2013 1452       Assessment and plan:   Patient Active Problem List   Diagnosis Date Noted  . Annual physical exam 04/28/2013  . Pap smear for cervical cancer screening 04/28/2013  . 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 If symptoms worsen the patient has been advised the ER  The patient was given clear instructions to go to ER or return to medical center if symptoms don't improve, worsen or new problems develop. The patient verbalized understanding. The patient was told to call to get any lab results if not heard anything in the next week.

## 2013-09-05 ENCOUNTER — Ambulatory Visit (HOSPITAL_COMMUNITY): Admission: RE | Admit: 2013-09-05 | Payer: No Typology Code available for payment source | Source: Ambulatory Visit

## 2014-07-12 ENCOUNTER — Encounter (HOSPITAL_COMMUNITY): Payer: Self-pay | Admitting: *Deleted

## 2014-07-12 ENCOUNTER — Emergency Department (HOSPITAL_COMMUNITY)
Admission: EM | Admit: 2014-07-12 | Discharge: 2014-07-12 | Disposition: A | Discharge status: Home / Self Care | Payer: Self-pay | Attending: Emergency Medicine | Admitting: Emergency Medicine

## 2014-07-12 DIAGNOSIS — Y92410 Unspecified street and highway as the place of occurrence of the external cause: Secondary | ICD-10-CM | POA: Insufficient documentation

## 2014-07-12 DIAGNOSIS — Y998 Other external cause status: Secondary | ICD-10-CM | POA: Insufficient documentation

## 2014-07-12 DIAGNOSIS — Z79899 Other long term (current) drug therapy: Secondary | ICD-10-CM | POA: Insufficient documentation

## 2014-07-12 DIAGNOSIS — Z7982 Long term (current) use of aspirin: Secondary | ICD-10-CM | POA: Insufficient documentation

## 2014-07-12 DIAGNOSIS — Y9389 Activity, other specified: Secondary | ICD-10-CM | POA: Insufficient documentation

## 2014-07-12 DIAGNOSIS — S161XXA Strain of muscle, fascia and tendon at neck level, initial encounter: Secondary | ICD-10-CM | POA: Insufficient documentation

## 2014-07-12 MED ORDER — DIAZEPAM 5 MG PO TABS: 5.0000 mg | ORAL_TABLET | Freq: Three times a day (TID) | ORAL | Status: DC | PRN

## 2014-07-12 MED ORDER — IBUPROFEN 800 MG PO TABS
800.0000 mg | ORAL_TABLET | Freq: Once | ORAL | Status: AC
  Administered 2014-07-12: 800 mg via ORAL
  Filled 2014-07-12: qty 1

## 2014-07-12 MED ORDER — IBUPROFEN 800 MG PO TABS: 800.0000 mg | ORAL_TABLET | Freq: Three times a day (TID) | ORAL | Status: DC | PRN

## 2014-07-12 MED ORDER — OXYCODONE-ACETAMINOPHEN 5-325 MG PO TABS: 1.0000 | ORAL_TABLET | ORAL | Status: DC | PRN

## 2014-07-12 NOTE — Discharge Instructions (Signed)
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
## 2014-12-17 ENCOUNTER — Emergency Department (HOSPITAL_COMMUNITY)
Admission: EM | Admit: 2014-12-17 | Discharge: 2014-12-17 | Disposition: A | Discharge status: Home / Self Care | Payer: Self-pay | Attending: Emergency Medicine | Admitting: Emergency Medicine

## 2014-12-17 ENCOUNTER — Encounter (HOSPITAL_COMMUNITY): Payer: Self-pay | Admitting: Emergency Medicine

## 2014-12-17 DIAGNOSIS — Z7982 Long term (current) use of aspirin: Secondary | ICD-10-CM | POA: Insufficient documentation

## 2014-12-17 DIAGNOSIS — Z79899 Other long term (current) drug therapy: Secondary | ICD-10-CM | POA: Insufficient documentation

## 2014-12-17 DIAGNOSIS — N390 Urinary tract infection, site not specified: Secondary | ICD-10-CM | POA: Insufficient documentation

## 2014-12-17 LAB — URINALYSIS, ROUTINE W REFLEX MICROSCOPIC
Bilirubin Urine: NEGATIVE
Glucose, UA: NEGATIVE mg/dL
Hgb urine dipstick: NEGATIVE
Ketones, ur: NEGATIVE mg/dL
Nitrite: NEGATIVE
PH: 7 (ref 5.0–8.0)
PROTEIN: NEGATIVE mg/dL
SPECIFIC GRAVITY, URINE: 1.011 (ref 1.005–1.030)
Urobilinogen, UA: 0.2 mg/dL (ref 0.0–1.0)

## 2014-12-17 LAB — URINE MICROSCOPIC-ADD ON

## 2014-12-17 MED ORDER — CEPHALEXIN 250 MG PO CAPS
500.0000 mg | ORAL_CAPSULE | Freq: Once | ORAL | Status: AC
  Administered 2014-12-17: 500 mg via ORAL
  Filled 2014-12-17: qty 2

## 2014-12-17 MED ORDER — CEPHALEXIN 500 MG PO CAPS: 500.0000 mg | ORAL_CAPSULE | Freq: Four times a day (QID) | ORAL | Status: DC

## 2014-12-17 NOTE — ED Notes (Signed)
Pt. Stated, Im having back pain and a burning like I have a UTI, which Ive had before.  I just need an antibiotic

## 2014-12-17 NOTE — ED Provider Notes (Signed)
CSN: [id number]     Arrival date & time 12/17/14  1228 History   First MD Initiated Contact with Patient 12/17/14 1505     Chief Complaint  Patient presents with  . Urinary Tract Infection  . Back Pain     (Consider location/radiation/quality/duration/timing/severity/associated sxs/prior Treatment) HPI   Pt is a 49 y.o female, hx of HTN, who presents to the ER today for 2 days of intermittent right flank pain, which is described as "stinging," and is without radiation.  Patient has not tried any medications to ease the pain, and further denies any dysuria, hematuria, nausea, vomiting, diarrhea, abdominal pain, fever, chills, sweats.  Patient has had no vaginal symptoms, denies any increased vaginal discharge, no itching or burning, no dyspareunia. Pt also denies any chest pain, SOB, but does complain of feeling more tired lately, has had difficulty sleeping with the back pain, and she suspects that she may be dehydrated.  She denies any lightheadedness.  Pt denies any recent trauma to back, has not done any strenuous activity or lifting, denies any lower extremity numbness, tingling, edema, weakness.  History reviewed. No pertinent past medical history. History reviewed. No pertinent past surgical history. Family History  Problem Relation Age of Onset  . Hypertension Mother   . Heart disease Mother   . Stroke Mother   . Hypertension Daughter    History  Substance Use Topics  . Smoking status: Never Smoker   . Smokeless tobacco: Not on file  . Alcohol Use: No   OB History    No data available     Review of Systems  HENT: Negative.   Eyes: Negative.   Respiratory: Negative.   Cardiovascular: Negative.   Gastrointestinal: Negative.   Genitourinary: Negative.   Musculoskeletal: Positive for back pain. Negative for myalgias, joint swelling, arthralgias, gait problem, neck pain and neck stiffness.  Skin: Negative.   Neurological: Negative.   Psychiatric/Behavioral: Negative.     10 Systems reviewed and are negative for acute change except as noted in the HPI.      Allergies  Review of patient's allergies indicates no known allergies.  Home Medications   Prior to Admission medications   Medication Sig Start Date End Date Taking? Authorizing Provider  aspirin 325 MG tablet Take 325 mg by mouth daily.    Historical Provider, MD  aspirin EC 81 MG tablet Take 1 tablet (81 mg total) by mouth daily. Patient not taking: Reported on 07/12/2014 09/04/13   Richarda Overlie, MD  cephALEXin (KEFLEX) 500 MG capsule Take 1 capsule (500 mg total) by mouth 4 (four) times daily. 12/17/14   Danelle Berry, PA-C  diazepam (VALIUM) 5 MG tablet Take 1 tablet (5 mg total) by mouth every 8 (eight) hours as needed for muscle spasms. 07/12/14   Kristen N Ward, DO  hydrochlorothiazide (HYDRODIURIL) 12.5 MG tablet Take 1 tablet (12.5 mg total) by mouth daily. Patient not taking: Reported on 07/12/2014 09/04/13   Richarda Overlie, MD  ibuprofen (ADVIL,MOTRIN) 800 MG tablet Take 1 tablet (800 mg total) by mouth every 8 (eight) hours as needed for mild pain. 07/12/14   Kristen N Ward, DO  Multiple Vitamins-Minerals (MULTIVITAMINS THER. W/MINERALS) TABS Take 1 tablet by mouth daily.    Historical Provider, MD  oxyCODONE-acetaminophen (PERCOCET/ROXICET) 5-325 MG per tablet Take 1 tablet by mouth every 4 (four) hours as needed. 07/12/14   Kristen N Ward, DO   BP 115/71 mmHg  Pulse 77  Temp(Src) 98.5 F (36.9 C) (Oral)  Resp  18  Ht 5\' 9"  (1.753 m)  Wt 166 lb (75.297 kg)  BMI 24.50 kg/m2  SpO2 93% Physical Exam  Constitutional: She is oriented to person, place, and time. Vital signs are normal. She appears well-developed and well-nourished. She does not have a sickly appearance. She does not appear ill. No distress.  HENT:  Head: Normocephalic and atraumatic.  Nose: Nose normal.  Mouth/Throat: Oropharynx is clear and moist. No oropharyngeal exudate.  Eyes: Conjunctivae and EOM are normal. Pupils are  equal, round, and reactive to light. Right eye exhibits no discharge. Left eye exhibits no discharge. No scleral icterus.  Neck: Normal range of motion. No JVD present. No tracheal deviation present. No thyromegaly present.  Cardiovascular: Normal rate, regular rhythm, normal heart sounds and intact distal pulses.  Exam reveals no gallop and no friction rub.   No murmur heard. Pulmonary/Chest: Effort normal and breath sounds normal. No stridor. No respiratory distress. She has no wheezes. She has no rales. She exhibits no tenderness.  Abdominal: Soft. Bowel sounds are normal. She exhibits no distension and no mass. There is no tenderness. There is no rebound and no guarding.  CVA tenderness right side  Musculoskeletal: Normal range of motion. She exhibits no edema or tenderness.  No spinal tenderness, cervical to lumbar, no paraspinal tenderness to palpation Normal range of motion of bilateral hips, bilateral knees, neck, back  Lymphadenopathy:    She has no cervical adenopathy.  Neurological: She is alert and oriented to person, place, and time. She has normal reflexes. No cranial nerve deficit. She exhibits normal muscle tone. Coordination normal.  Skin: Skin is warm and dry. No rash noted. She is not diaphoretic. No erythema. No pallor.  Psychiatric: She has a normal mood and affect. Her behavior is normal. Judgment and thought content normal.  Nursing note and vitals reviewed.   ED Course  Procedures (including critical care time) Labs Review Labs Reviewed  URINALYSIS, ROUTINE W REFLEX MICROSCOPIC (NOT AT [facility]) - Abnormal; Notable for the following:    Leukocytes, UA TRACE (*)    All other components within normal limits  URINE MICROSCOPIC-ADD ON - Abnormal; Notable for the following:    Squamous Epithelial / LPF FEW (*)    Bacteria, UA FEW (*)    All other components within normal limits    Imaging Review No results found.   EKG Interpretation None      MDM   Final  diagnoses:  UTI (lower urinary tract infection)   Medications  cephALEXin (KEFLEX) capsule 500 mg (not administered)   Filed Vitals:   12/17/14 1232 12/17/14 1517  BP: 137/82 115/71  Pulse: 107 77  Temp: 98.5 F (36.9 C)   TempSrc: Oral   Resp: 16 18  Height: 5\' 9"  (1.753 m)   Weight: 166 lb (75.297 kg)   SpO2: 94% 93%    Pt with bacteria/leuks in UA, complaint of right flank pain w/o N/V/Fever or abdominal pain Patient vitals are normal Pt will be given a dose of keflex in ED, cannot fill prescription tonight, is going to work Pt denies being in pain and has stated she will use Tylenol and Aleve for her discomfort.      Danelle Berry, PA-C 12/17/14 1535  Linwood Dibbles, MD 12/17/14 1536

## 2014-12-17 NOTE — Discharge Instructions (Signed)

## 2015-02-18 ENCOUNTER — Ambulatory Visit: Payer: Self-pay

## 2015-03-25 ENCOUNTER — Other Ambulatory Visit: Payer: Self-pay | Admitting: Internal Medicine

## 2015-03-25 ENCOUNTER — Telehealth: Payer: Self-pay

## 2015-03-25 NOTE — Telephone Encounter (Signed)
Nurse called patient on mobile number, reached voicemail. Left message for patient to call Shakeyla Giebler with [facility], at [phone]. Nurse called patient at home number, home number is not a working number.  Refill requested for hydrochlorothiazide received, patient needs appointment, last seen 05/18/13.

## 2015-03-25 NOTE — Telephone Encounter (Signed)
Patient explains she does not have high blood pressure.  Per patient, Provider gave her clonidine for ADD. Patient does not want to refill hydrochlorothiazide at this time. She will discuss medications at her appointment on 04/22/15 with Dr. Hyman HopesJegede.

## 2015-03-25 NOTE — Telephone Encounter (Signed)
error 

## 2015-04-22 ENCOUNTER — Ambulatory Visit: Payer: Self-pay | Attending: Internal Medicine | Admitting: Internal Medicine

## 2015-04-22 ENCOUNTER — Encounter: Payer: Self-pay | Admitting: Internal Medicine

## 2015-04-22 VITALS — BP 114/73 | HR 62 | Temp 98.0°F | Resp 18 | Ht 68.0 in | Wt 164.0 lb

## 2015-04-22 DIAGNOSIS — F909 Attention-deficit hyperactivity disorder, unspecified type: Secondary | ICD-10-CM

## 2015-04-22 DIAGNOSIS — Z0000 Encounter for general adult medical examination without abnormal findings: Secondary | ICD-10-CM | POA: Insufficient documentation

## 2015-04-22 DIAGNOSIS — I10 Essential (primary) hypertension: Secondary | ICD-10-CM

## 2015-04-22 LAB — COMPLETE METABOLIC PANEL WITH GFR
ALT: 19 U/L (ref 6–29)
AST: 19 U/L (ref 10–35)
Albumin: 3.9 g/dL (ref 3.6–5.1)
Alkaline Phosphatase: 70 U/L (ref 33–130)
BILIRUBIN TOTAL: 0.3 mg/dL (ref 0.2–1.2)
BUN: 9 mg/dL (ref 7–25)
CO2: 31 mmol/L (ref 20–31)
CREATININE: 1.02 mg/dL (ref 0.50–1.05)
Calcium: 9.2 mg/dL (ref 8.6–10.4)
Chloride: 105 mmol/L (ref 98–110)
GFR, EST NON AFRICAN AMERICAN: 64 mL/min (ref >=60)
GFR, Est African American: 74 mL/min (ref >=60)
GLUCOSE: 85 mg/dL (ref 65–99)
Potassium: 4.6 mmol/L (ref 3.5–5.3)
SODIUM: 142 mmol/L (ref 135–146)
Total Protein: 6.8 g/dL (ref 6.1–8.1)

## 2015-04-22 LAB — LIPID PANEL
Cholesterol: 148 mg/dL (ref 125–200)
HDL: 69 mg/dL (ref >=46)
LDL Cholesterol: 65 mg/dL (ref <130)
TRIGLYCERIDES: 72 mg/dL (ref <150)
Total CHOL/HDL Ratio: 2.1 Ratio (ref <=5.0)
VLDL: 14 mg/dL (ref <30)

## 2015-04-22 LAB — TSH: TSH: 1.009 u[IU]/mL (ref 0.350–4.500)

## 2015-04-22 MED ORDER — CLONIDINE HCL 0.1 MG PO TABS: 0.1000 mg | ORAL_TABLET | Freq: Two times a day (BID) | ORAL | Status: DC

## 2015-04-22 NOTE — Progress Notes (Signed)
Patient ID: Brandi Collins, female   DOB: 1966-03-19, 49 y.o.   MRN: [id number]   Brandi Collins, is a 49 y.o. female  [id number]  [id number]  DOB - 1966-03-19  No chief complaint on file.       Subjective:   Brandi Collins is a 49 y.o. female here today for a follow up visit. Patient has also been seen in our clinic once in the past, December 2014. Patient claimed she was diagnosed with ADHD from the clinic and will like to be started on medication because she can stay focused. She had reported in the past that her diagnosis was when she was a child, but today she said she was diagnosed recently. She claims that she is very hyperactive, she is all over the place, not able to be focused, and this bothers her. She confessed to smoking marijuana very frequently and takes cocaine occasionally. She denies being depressed but anxious. She denies any suicidal ideation or thoughts. She denies smoking history, she does not drink alcohol. She is not on any medication at the moment. She used to be on clonidine for her ADHD according to her. She has no other complaints today. She also has history of hypertension on hydrochlorothiazide low dose, patient has not been taking it for over a year. Blood pressure has been stable Patient has No headache, No chest pain, No abdominal pain - No Nausea, No new weakness tingling or numbness, No Cough - SOB.  Problem  Health Care Maintenance  Essential Hypertension, Benign    ALLERGIES: No Known Allergies  PAST MEDICAL HISTORY: History reviewed. No pertinent past medical history.  MEDICATIONS AT HOME: Prior to Admission medications   Medication Sig Start Date End Date Taking? Authorizing Provider  aspirin EC 81 MG tablet Take 1 tablet (81 mg total) by mouth daily. 09/04/13  Yes Richarda OverlieNayana Abrol, MD  ibuprofen (ADVIL,MOTRIN) 800 MG tablet Take 1 tablet (800 mg total) by mouth every 8 (eight) hours as needed for mild pain. 07/12/14  Yes Kristen N Ward, DO    Multiple Vitamins-Minerals (MULTIVITAMINS THER. W/MINERALS) TABS Take 1 tablet by mouth daily.   Yes Historical Provider, MD  aspirin 325 MG tablet Take 325 mg by mouth daily.    Historical Provider, MD  cephALEXin (KEFLEX) 500 MG capsule Take 1 capsule (500 mg total) by mouth 4 (four) times daily. Patient not taking: Reported on 04/22/2015 12/17/14   Danelle BerryLeisa Tapia, PA-C  cloNIDine (CATAPRES) 0.1 MG tablet Take 1 tablet (0.1 mg total) by mouth 2 (two) times daily. 04/22/15   Quentin Angstlugbemiga E Berlie Hatchel, MD  diazepam (VALIUM) 5 MG tablet Take 1 tablet (5 mg total) by mouth every 8 (eight) hours as needed for muscle spasms. Patient not taking: Reported on 04/22/2015 07/12/14   Kristen N Ward, DO  hydrochlorothiazide (HYDRODIURIL) 12.5 MG tablet Take 1 tablet (12.5 mg total) by mouth daily. Patient not taking: Reported on 07/12/2014 09/04/13   Richarda OverlieNayana Abrol, MD  oxyCODONE-acetaminophen (PERCOCET/ROXICET) 5-325 MG per tablet Take 1 tablet by mouth every 4 (four) hours as needed. Patient not taking: Reported on 04/22/2015 07/12/14   Layla MawKristen N Ward, DO     Objective:   Filed Vitals:   04/22/15 1045  BP: 114/73  Pulse: 62  Temp: 98 F (36.7 C)  TempSrc: Oral  Resp: 18  Height: 5\' 8"  (1.727 m)  Weight: 164 lb (74.39 kg)  SpO2: 95%    Exam General appearance : Awake, alert, not in any distress. Speech Clear. Not toxic  looking HEENT: Atraumatic and Normocephalic, pupils equally reactive to light and accomodation Neck: supple, no JVD. No cervical lymphadenopathy.  Chest:Good air entry bilaterally, no added sounds  CVS: S1 S2 regular, no murmurs.  Abdomen: Bowel sounds present, Non tender and not distended with no gaurding, rigidity or rebound. Extremities: B/L Lower Ext shows no edema, both legs are warm to touch Neurology: Awake alert, and oriented X 3, CN II-XII intact, Non focal Skin:No Rash  Data Review No results found for: HGBA1C   Assessment & Plan   1. Health care maintenance  - Flu  Vaccine QUAD 36+ mos PF IM (Fluarix & Fluzone Quad PF)  2. Hyperactive adult syndrome  - cloNIDine (CATAPRES) 0.1 MG tablet; Take 1 tablet (0.1 mg total) by mouth 2 (two) times daily.  Dispense: 60 tablet; Refill: 3 - TSH - Drug Screen, Urine  3. Essential hypertension, benign  - cloNIDine (CATAPRES) 0.1 MG tablet; Take 1 tablet (0.1 mg total) by mouth 2 (two) times daily.  Dispense: 60 tablet; Refill: 3 - COMPLETE METABOLIC PANEL WITH GFR - Lipid panel - Urinalysis, Complete  Patient have been counseled extensively about nutrition and exercise  Return in about 6 months (around 10/21/2015) for Routine Follow Up, Annual Physical.  The patient was given clear instructions to go to ER or return to medical center if symptoms don't improve, worsen or new problems develop. The patient verbalized understanding. The patient was told to call to get lab results if they haven't heard anything in the next week.   This note has been created with Education officer, environmental. Any transcriptional errors are unintentional.    Jeanann Lewandowsky, MD, MHA, Maxwell Caul, CPE [facility] and [facility] [place], [place] [phone]   04/22/2015, 11:51 AM

## 2015-04-22 NOTE — Patient Instructions (Signed)
Clonidine extended release tablets (ADHD) What is this medicine? CLONIDINE (KLOE ni deen) is used to treat attention-deficit hyperactivity disorder (ADHD). This medicine may be used for other purposes; ask your health care provider or pharmacist if you have questions. What should I tell my health care provider before I take this medicine? They need to know if you have any of these conditions: -bleeding in the brain -heart disease -high or low blood pressure -history of slow or irregular heartbeat -kidney disease -an unusual or allergic reaction to clonidine, other medicines, foods, dyes, or preservatives -pregnant or trying to get pregnant -breast-feeding How should I use this medicine? Take this medicine by mouth with a glass of water. Follow the directions on the prescription label. Do not cut, crush or chew this medicine. You can take it with or without food. If it upsets your stomach, take it with food. Take your doses at regular intervals. Do not take your medicine more often than directed. Do not suddenly stop taking this medicine. You must gradually reduce the dose. Ask your doctor or health care professional for advice. Talk to your pediatrician regarding the use of this medicine in children. While this drug may be prescribed for children as young as 6 years for selected conditions, precautions do apply. Overdosage: If you think you have taken too much of this medicine contact a poison control center or emergency room at once. NOTE: This medicine is only for you. Do not share this medicine with others. What if I miss a dose? If you miss a dose, skip the missed dose. Take the next dose at your regular time. Do not take double or extra doses. What may interact with this medicine? Do not take this medicine with any of the following medications: -MAOIs like Carbex, Eldepryl, Marplan, Nardil, and Parnate This medicine may also interact with the following medications: -alcohol -certain  medicines for seizures like phenobarbital -certain medicines for blood pressure, heart disease, irregular heart beat -certain medicines for depression, anxiety, or psychotic disturbances -medicines for sleep This list may not describe all possible interactions. Give your health care provider a list of all the medicines, herbs, non-prescription drugs, or dietary supplements you use. Also tell them if you smoke, drink alcohol, or use illegal drugs. Some items may interact with your medicine. What should I watch for while using this medicine? Visit your doctor or health care professional for regular checks on your progress. Check your heart rate and blood pressure regularly while you are taking this medicine. Ask your doctor or health care professional what your heart rate should be and when you should contact him or her. You may get drowsy or dizzy. Do not drive, use machinery, or do anything that needs mental alertness until you know how this medicine affects you. To avoid dizzy or fainting spells, do not stand or sit up quickly. Alcohol can make you more drowsy and dizzy. Avoid alcoholic drinks. Avoid becoming dehydrated or overheated. Do not stop taking except on your doctor's advice. You may develop a severe reaction. Your doctor will tell you how much medicine to take. Your mouth may get dry. Chewing sugarless gum or sucking hard candy, and drinking plenty of water will help. What side effects may I notice from receiving this medicine? Side effects that you should report to your doctor or health care professional as soon as possible: -allergic reactions like skin rash, itching or hives, swelling of the face, lips, or tongue -anxious or change in mood -increased body temperature -  low blood pressure -unusually slow heartbeat -unusually weak or tired Side effects that usually do not require medical attention (Report these to your doctor or health care professional if they continue or are  bothersome.): -constipation -dizziness -dry mouth -headache -loss of appetite -nightmares or trouble sleeping -tiredness This list may not describe all possible side effects. Call your doctor for medical advice about side effects. You may report side effects to FDA at 1-800-FDA-1088. Where should I keep my medicine? Keep out of the reach of children. Store at room temperature between 20 and 25 degrees C (68 and 77 degrees F). Protect from light. Keep container tightly closed. Throw away any unused medicine after the expiration date. NOTE: This sheet is a summary. It may not cover all possible information. If you have questions about this medicine, talk to your doctor, pharmacist, or health care provider.    2016, Elsevier/Gold Standard. (2013-10-29 13:00:01) Attention Deficit Hyperactivity Disorder Attention deficit hyperactivity disorder (ADHD) is a problem with behavior issues based on the way the brain functions (neurobehavioral disorder). It is a common reason for behavior and academic problems in school. SYMPTOMS  There are 3 types of ADHD. The 3 types and some of the symptoms include:  Inattentive.  Gets bored or distracted easily.  Loses or forgets things. Forgets to hand in homework.  Has trouble organizing or completing tasks.  Difficulty staying on task.  An inability to organize daily tasks and school work.  Leaving projects, chores, or homework unfinished.  Trouble paying attention or responding to details. Careless mistakes.  Difficulty following directions. Often seems like is not listening.  Dislikes activities that require sustained attention (like chores or homework).  Hyperactive-impulsive.  Feels like it is impossible to sit still or stay in a seat. Fidgeting with hands and feet.  Trouble waiting turn.  Talking too much or out of turn. Interruptive.  Speaks or acts impulsively.  Aggressive, disruptive behavior.  Constantly busy or on the go;  noisy.  Often leaves seat when they are expected to remain seated.  Often runs or climbs where it is not appropriate, or feels very restless.  Combined.  Has symptoms of both of the above. Often children with ADHD feel discouraged about themselves and with school. They often perform well below their abilities in school. As children get older, the excess motor activities can calm down, but the problems with paying attention and staying organized persist. Most children do not outgrow ADHD but with good treatment can learn to cope with the symptoms. DIAGNOSIS  When ADHD is suspected, the diagnosis should be made by professionals trained in ADHD. This professional will collect information about the individual suspected of having ADHD. Information must be collected from various settings where the person lives, works, or attends school.  Diagnosis will include:  Confirming symptoms began in childhood.  Ruling out other reasons for the child's behavior.  The health care providers will check with the child's school and check their medical records.  They will talk to teachers and parents.  Behavior rating scales for the child will be filled out by those dealing with the child on a daily basis. A diagnosis is made only after all information has been considered. TREATMENT  Treatment usually includes behavioral treatment, tutoring or extra support in school, and stimulant medicines. Because of the way a person's brain works with ADHD, these medicines decrease impulsivity and hyperactivity and increase attention. This is different than how they would work in a person who does not have  ADHD. Other medicines used include antidepressants and certain blood pressure medicines. Most experts agree that treatment for ADHD should address all aspects of the person's functioning. Along with medicines, treatment should include structured classroom management at school. Parents should reward good behavior, provide  constant discipline, and set limits. Tutoring should be available for the child as needed. ADHD is a lifelong condition. If untreated, the disorder can have long-term serious effects into adolescence and adulthood. HOME CARE INSTRUCTIONS   Often with ADHD there is a lot of frustration among family members dealing with the condition. Blame and anger are also feelings that are common. In many cases, because the problem affects the family as a whole, the entire family may need help. A therapist can help the family find better ways to handle the disruptive behaviors of the person with ADHD and promote change. If the person with ADHD is young, most of the therapist's work is with the parents. Parents will learn techniques for coping with and improving their child's behavior. Sometimes only the child with the ADHD needs counseling. Your health care providers can help you make these decisions.  Children with ADHD may need help learning how to organize. Some helpful tips include:  Keep routines the same every day from wake-up time to bedtime. Schedule all activities, including homework and playtime. Keep the schedule in a place where the person with ADHD will often see it. Mark schedule changes as far in advance as possible.  Schedule outdoor and indoor recreation.  Have a place for everything and keep everything in its place. This includes clothing, backpacks, and school supplies.  Encourage writing down assignments and bringing home needed books. Work with your child's teachers for assistance in organizing school work.  Offer your child a well-balanced diet. Breakfast that includes a balance of whole grains, protein, and fruits or vegetables is especially important for school performance. Children should avoid drinks with caffeine including:  Soft drinks.  Coffee.  Tea.  However, some older children (adolescents) may find these drinks helpful in improving their attention. Because it can also be  common for adolescents with ADHD to become addicted to caffeine, talk with your health care provider about what is a safe amount of caffeine intake for your child.  Children with ADHD need consistent rules that they can understand and follow. If rules are followed, give small rewards. Children with ADHD often receive, and expect, criticism. Look for good behavior and praise it. Set realistic goals. Give clear instructions. Look for activities that can foster success and self-esteem. Make time for pleasant activities with your child. Give lots of affection.  Parents are their children's greatest advocates. Learn as much as possible about ADHD. This helps you become a stronger and better advocate for your child. It also helps you educate your child's teachers and instructors if they feel inadequate in these areas. Parent support groups are often helpful. A national group with local chapters is called Children and Adults with Attention Deficit Hyperactivity Disorder (CHADD). SEEK MEDICAL CARE IF:  Your child has repeated muscle twitches, cough, or speech outbursts.  Your child has sleep problems.  Your child has a marked loss of appetite.  Your child develops depression.  Your child has new or worsening behavioral problems.  Your child develops dizziness.  Your child has a racing heart.  Your child has stomach pains.  Your child develops headaches. SEEK IMMEDIATE MEDICAL CARE IF:  Your child has been diagnosed with depression or anxiety and the symptoms seem  to be getting worse.  Your child has been depressed and suddenly appears to have increased energy or motivation.  You are worried that your child is having a bad reaction to a medication he or she is taking for ADHD.   This information is not intended to replace advice given to you by your health care provider. Make sure you discuss any questions you have with your health care provider.   Document Released: 04/28/2002 Document  Revised: 05/13/2013 Document Reviewed: 01/13/2013 Elsevier Interactive Patient Education 2016 [employer]. Hypertension Hypertension, commonly called high blood pressure, is when the force of blood pumping through your arteries is too strong. Your arteries are the blood vessels that carry blood from your heart throughout your body. A blood pressure reading consists of a higher number over a lower number, such as 110/72. The higher number (systolic) is the pressure inside your arteries when your heart pumps. The lower number (diastolic) is the pressure inside your arteries when your heart relaxes. Ideally you want your blood pressure below 120/80. Hypertension forces your heart to work harder to pump blood. Your arteries may become narrow or stiff. Having untreated or uncontrolled hypertension can cause heart attack, stroke, kidney disease, and other problems. RISK FACTORS Some risk factors for high blood pressure are controllable. Others are not.  Risk factors you cannot control include:   Race. You may be at higher risk if you are African American.  Age. Risk increases with age.  Gender. Men are at higher risk than women before age 51 years. After age 43, women are at higher risk than men. Risk factors you can control include:  Not getting enough exercise or physical activity.  Being overweight.  Getting too much fat, sugar, calories, or salt in your diet.  Drinking too much alcohol. SIGNS AND SYMPTOMS Hypertension does not usually cause signs or symptoms. Extremely high blood pressure (hypertensive crisis) may cause headache, anxiety, shortness of breath, and nosebleed. DIAGNOSIS To check if you have hypertension, your health care provider will measure your blood pressure while you are seated, with your arm held at the level of your heart. It should be measured at least twice using the same arm. Certain conditions can cause a difference in blood pressure between your right and left  arms. A blood pressure reading that is higher than normal on one occasion does not mean that you need treatment. If it is not clear whether you have high blood pressure, you may be asked to return on a different day to have your blood pressure checked again. Or, you may be asked to monitor your blood pressure at home for 1 or more weeks. TREATMENT Treating high blood pressure includes making lifestyle changes and possibly taking medicine. Living a healthy lifestyle can help lower high blood pressure. You may need to change some of your habits. Lifestyle changes may include:  Following the DASH diet. This diet is high in fruits, vegetables, and whole grains. It is low in salt, red meat, and added sugars.  Keep your sodium intake below 2,300 mg per day.  Getting at least 30-45 minutes of aerobic exercise at least 4 times per week.  Losing weight if necessary.  Not smoking.  Limiting alcoholic beverages.  Learning ways to reduce stress. Your health care provider may prescribe medicine if lifestyle changes are not enough to get your blood pressure under control, and if one of the following is true:  You are 68-43 years of age and your systolic blood pressure is  above 140.  You are 50 years of age or older, and your systolic blood pressure is above 150.  Your diastolic blood pressure is above 90.  You have diabetes, and your systolic blood pressure is over 140 or your diastolic blood pressure is over 90.  You have kidney disease and your blood pressure is above 140/90.  You have heart disease and your blood pressure is above 140/90. Your personal target blood pressure may vary depending on your medical conditions, your age, and other factors. HOME CARE INSTRUCTIONS  Have your blood pressure rechecked as directed by your health care provider.   Take medicines only as directed by your health care provider. Follow the directions carefully. Blood pressure medicines must be taken as  prescribed. The medicine does not work as well when you skip doses. Skipping doses also puts you at risk for problems.  Do not smoke.   Monitor your blood pressure at home as directed by your health care provider. SEEK MEDICAL CARE IF:   You think you are having a reaction to medicines taken.  You have recurrent headaches or feel dizzy.  You have swelling in your ankles.  You have trouble with your vision. SEEK IMMEDIATE MEDICAL CARE IF:  You develop a severe headache or confusion.  You have unusual weakness, numbness, or feel faint.  You have severe chest or abdominal pain.  You vomit repeatedly.  You have trouble breathing. MAKE SURE YOU:   Understand these instructions.  Will watch your condition.  Will get help right away if you are not doing well or get worse.   This information is not intended to replace advice given to you by your health care provider. Make sure you discuss any questions you have with your health care provider.   Document Released: 05/08/2005 Document Revised: 09/22/2014 Document Reviewed: 02/28/2013 Elsevier Interactive Patient Education 2016 Elsevier Inc. DASH Eating Plan DASH stands for "Dietary Approaches to Stop Hypertension." The DASH eating plan is a healthy eating plan that has been shown to reduce high blood pressure (hypertension). Additional health benefits may include reducing the risk of type 2 diabetes mellitus, heart disease, and stroke. The DASH eating plan may also help with weight loss. WHAT DO I NEED TO KNOW ABOUT THE DASH EATING PLAN? For the DASH eating plan, you will follow these general guidelines:  Choose foods with a percent daily value for sodium of less than 5% (as listed on the food label).  Use salt-free seasonings or herbs instead of table salt or sea salt.  Check with your health care provider or pharmacist before using salt substitutes.  Eat lower-sodium products, often labeled as "lower sodium" or "no salt  added."  Eat fresh foods.  Eat more vegetables, fruits, and low-fat dairy products.  Choose whole grains. Look for the word "whole" as the first word in the ingredient list.  Choose fish and skinless chicken or [place] more often than red meat. Limit fish, poultry, and meat to 6 oz (170 g) each day.  Limit sweets, desserts, sugars, and sugary drinks.  Choose heart-healthy fats.  Limit cheese to 1 oz (28 g) per day.  Eat more home-cooked food and less restaurant, buffet, and fast food.  Limit fried foods.  Cook foods using methods other than frying.  Limit canned vegetables. If you do use them, rinse them well to decrease the sodium.  When eating at a restaurant, ask that your food be prepared with less salt, or no salt if possible. WHAT FOODS  CAN I EAT? Seek help from a dietitian for individual calorie needs. Grains Whole grain or whole wheat bread. Brown rice. Whole grain or whole wheat pasta. Quinoa, bulgur, and whole grain cereals. Low-sodium cereals. Corn or whole wheat flour tortillas. Whole grain cornbread. Whole grain crackers. Low-sodium crackers. Vegetables Fresh or frozen vegetables (raw, steamed, roasted, or grilled). Low-sodium or reduced-sodium tomato and vegetable juices. Low-sodium or reduced-sodium tomato sauce and paste. Low-sodium or reduced-sodium canned vegetables.  Fruits All fresh, canned (in natural juice), or frozen fruits. Meat and Other Protein Products Ground beef (85% or leaner), grass-fed beef, or beef trimmed of fat. Skinless chicken or [place]. Ground chicken or [place]. Pork trimmed of fat. All fish and seafood. Eggs. Dried beans, peas, or lentils. Unsalted nuts and seeds. Unsalted canned beans. Dairy Low-fat dairy products, such as skim or 1% milk, 2% or reduced-fat cheeses, low-fat ricotta or cottage cheese, or plain low-fat yogurt. Low-sodium or reduced-sodium cheeses. Fats and Oils Tub margarines without trans fats. Light or reduced-fat  mayonnaise and salad dressings (reduced sodium). Avocado. Safflower, olive, or canola oils. Natural peanut or almond butter. Other Unsalted popcorn and pretzels. The items listed above may not be a complete list of recommended foods or beverages. Contact your dietitian for more options. WHAT FOODS ARE NOT RECOMMENDED? Grains White bread. White pasta. White rice. Refined cornbread. Bagels and croissants. Crackers that contain trans fat. Vegetables Creamed or fried vegetables. Vegetables in a cheese sauce. Regular canned vegetables. Regular canned tomato sauce and paste. Regular tomato and vegetable juices. Fruits Dried fruits. Canned fruit in light or heavy syrup. Fruit juice. Meat and Other Protein Products Fatty cuts of meat. Ribs, chicken wings, bacon, sausage, bologna, salami, chitterlings, fatback, hot dogs, bratwurst, and packaged luncheon meats. Salted nuts and seeds. Canned beans with salt. Dairy Whole or 2% milk, cream, half-and-half, and cream cheese. Whole-fat or sweetened yogurt. Full-fat cheeses or blue cheese. Nondairy creamers and whipped toppings. Processed cheese, cheese spreads, or cheese curds. Condiments Onion and garlic salt, seasoned salt, table salt, and sea salt. Canned and packaged gravies. Worcestershire sauce. Tartar sauce. Barbecue sauce. Teriyaki sauce. Soy sauce, including reduced sodium. Steak sauce. Fish sauce. Oyster sauce. Cocktail sauce. Horseradish. Ketchup and mustard. Meat flavorings and tenderizers. Bouillon cubes. Hot sauce. Tabasco sauce. Marinades. Taco seasonings. Relishes. Fats and Oils Butter, stick margarine, lard, shortening, ghee, and bacon fat. Coconut, palm kernel, or palm oils. Regular salad dressings. Other Pickles and olives. Salted popcorn and pretzels. The items listed above may not be a complete list of foods and beverages to avoid. Contact your dietitian for more information. WHERE CAN I FIND MORE INFORMATION? National Heart, Lung, and  Blood Institute: [employer]   This information is not intended to replace advice given to you by your health care provider. Make sure you discuss any questions you have with your health care provider.   Document Released: 04/27/2011 Document Revised: 05/29/2014 Document Reviewed: 03/12/2013 Elsevier Interactive Patient Education [employer]

## 2015-04-22 NOTE — Progress Notes (Signed)
Patient here for something for ADHD. Patient was diagnosed here 2014. Patient needs medication. Patient having hard time focusing.   Patient needs refills on all medications, no pain meds.   Patient denies pain at this time.  Patient wants flu shot.

## 2015-04-23 ENCOUNTER — Telehealth: Payer: Self-pay

## 2015-04-23 LAB — URINALYSIS, COMPLETE

## 2015-04-23 LAB — DRUG SCREEN, URINE

## 2015-04-23 NOTE — Telephone Encounter (Signed)
[name] from [facility] called on this patient  Stated lab never received a urine specimen Session # [id number] Call transferred to lab

## 2015-04-28 NOTE — Telephone Encounter (Signed)
Patient verified DOB Patient informed of lab results being normal. Patient had no further questions and expressed her understanding.

## 2015-04-28 NOTE — Telephone Encounter (Signed)
-----   Message from Quentin Angstlugbemiga E Jegede, MD sent at 04/23/2015  5:45 PM EST ----- Please inform patient that her laboratory results are normal.

## 2015-07-03 ENCOUNTER — Emergency Department (HOSPITAL_COMMUNITY)
Admission: EM | Admit: 2015-07-03 | Discharge: 2015-07-03 | Disposition: A | Discharge status: Home / Self Care | Payer: No Typology Code available for payment source | Attending: Emergency Medicine | Admitting: Emergency Medicine

## 2015-07-03 ENCOUNTER — Encounter (HOSPITAL_COMMUNITY): Payer: Self-pay | Admitting: *Deleted

## 2015-07-03 DIAGNOSIS — Y9389 Activity, other specified: Secondary | ICD-10-CM | POA: Insufficient documentation

## 2015-07-03 DIAGNOSIS — Y92009 Unspecified place in unspecified non-institutional (private) residence as the place of occurrence of the external cause: Secondary | ICD-10-CM | POA: Insufficient documentation

## 2015-07-03 DIAGNOSIS — W25XXXA Contact with sharp glass, initial encounter: Secondary | ICD-10-CM | POA: Insufficient documentation

## 2015-07-03 DIAGNOSIS — S81811A Laceration without foreign body, right lower leg, initial encounter: Secondary | ICD-10-CM

## 2015-07-03 DIAGNOSIS — Y998 Other external cause status: Secondary | ICD-10-CM | POA: Insufficient documentation

## 2015-07-03 MED ORDER — LIDOCAINE-EPINEPHRINE (PF) 2 %-1:200000 IJ SOLN
10.0000 mL | Freq: Once | INTRAMUSCULAR | Status: AC
  Administered 2015-07-03: 10 mL via INTRADERMAL
  Filled 2015-07-03: qty 10

## 2015-07-03 MED ORDER — BACITRACIN ZINC 500 UNIT/GM EX OINT
TOPICAL_OINTMENT | Freq: Once | CUTANEOUS | Status: AC
  Administered 2015-07-03: 06:00:00 via TOPICAL
  Filled 2015-07-03: qty 0.9

## 2015-07-03 MED ORDER — LIDOCAINE HCL (CARDIAC) 20 MG/ML IV SOLN
INTRAVENOUS | Status: AC
  Filled 2015-07-03: qty 10

## 2015-07-03 NOTE — ED Notes (Signed)
PA at bedside with suture cart to suture wound. Provided with lidocaine-epi sent from main pharmacy.

## 2015-07-03 NOTE — ED Provider Notes (Signed)
TIME SEEN: 6:00 AM  CHIEF COMPLAINT: Right leg laceration  HPI: Pt is a 50 y.o. female with history of hypertension who presents emergency department with a right leg laceration that she cut on a broken mirror in her house. No other injury. She states she does not think there are any foreign bodies in the wound. Her last tetanus vaccination was within the past 5 years. No numbness or focal weakness.  ROS: See HPI Constitutional: no fever  Eyes: no drainage  ENT: no runny nose   Cardiovascular:  no chest pain  Resp: no SOB  GI: no vomiting GU: no dysuria Integumentary: no rash  Allergy: no hives  Musculoskeletal: no leg swelling  Neurological: no slurred speech ROS otherwise negative  PAST MEDICAL HISTORY/PAST SURGICAL HISTORY:  History reviewed. No pertinent past medical history.  MEDICATIONS:  Prior to Admission medications   Medication Sig Start Date End Date Taking? Authorizing Provider  cloNIDine (CATAPRES) 0.1 MG tablet Take 1 tablet (0.1 mg total) by mouth 2 (two) times daily. 04/22/15  Yes Quentin Angst, MD  aspirin EC 81 MG tablet Take 1 tablet (81 mg total) by mouth daily. Patient not taking: Reported on 07/03/2015 09/04/13   Richarda Overlie, MD  cephALEXin (KEFLEX) 500 MG capsule Take 1 capsule (500 mg total) by mouth 4 (four) times daily. Patient not taking: Reported on 04/22/2015 12/17/14   Danelle Berry, PA-C  diazepam (VALIUM) 5 MG tablet Take 1 tablet (5 mg total) by mouth every 8 (eight) hours as needed for muscle spasms. Patient not taking: Reported on 04/22/2015 07/12/14   Kristen N Ward, DO  hydrochlorothiazide (HYDRODIURIL) 12.5 MG tablet Take 1 tablet (12.5 mg total) by mouth daily. Patient not taking: Reported on 07/12/2014 09/04/13   Richarda Overlie, MD  ibuprofen (ADVIL,MOTRIN) 800 MG tablet Take 1 tablet (800 mg total) by mouth every 8 (eight) hours as needed for mild pain. Patient not taking: Reported on 07/03/2015 07/12/14   Layla Maw Ward, DO   oxyCODONE-acetaminophen (PERCOCET/ROXICET) 5-325 MG per tablet Take 1 tablet by mouth every 4 (four) hours as needed. Patient not taking: Reported on 04/22/2015 07/12/14   Layla Maw Ward, DO    ALLERGIES:  No Known Allergies  SOCIAL HISTORY:  Social History  Substance Use Topics  . Smoking status: Never Smoker   . Smokeless tobacco: Not on file  . Alcohol Use: No    FAMILY HISTORY: Family History  Problem Relation Age of Onset  . Hypertension Mother   . Heart disease Mother   . Stroke Mother   . Hypertension Daughter     EXAM: BP 101/74 mmHg  Pulse 70  Temp(Src) 98.1 F (36.7 C)  Resp 16  Ht  (1.753 m)  Wt 168 lb (76.204 kg)  BMI 24.80 kg/m2  SpO2 98% CONSTITUTIONAL: Alert and oriented and responds appropriately to questions. Well-appearing; well-nourished HEAD: Normocephalic EYES: Conjunctivae clear, PERRL ENT: normal nose; no rhinorrhea; moist mucous membranes; pharynx without lesions noted NECK: Supple, no meningismus, no LAD  CARD: RRR; S1 and S2 appreciated; no murmurs, no clicks, no rubs, no gallops RESP: Normal chest excursion without splinting or tachypnea; breath sounds clear and equal bilaterally; no wheezes, no rhonchi, no rales, no hypoxia or respiratory distress, speaking full sentences ABD/GI: Normal bowel sounds; non-distended; soft, non-tender, no rebound, no guarding, no peritoneal signs BACK:  The back appears normal and is non-tender to palpation, there is no CVA tenderness EXT: Normal ROM in all joints; non-tender to palpation; no edema;  normal capillary refill; no cyanosis, no calf tenderness or swelling, 6-7cm superficial laceration to the lateral proximal right lower extremity just below the knee, 2+ DP pulses bilaterally    SKIN: Normal color for age and race; warm NEURO: Moves all extremities equally, sensation to light touch intact diffusely, cranial nerves II through XII intact PSYCH: The patient's mood and manner are appropriate. Grooming  and personal hygiene are appropriate.  MEDICAL DECISION MAKING: Patient here with a laceration that is superficial to the right lower extremity. Does not involve the joint. No bony injury on exam. Neurovascularly intact distally. No sign of foreign body in the wound. Wound has been cleaned, repaired. Her tetanus is up-to-date. Discussed return precautions. She verbalizes understanding and is comfortable with this plan.      LACERATION REPAIR Performed by: Jackey Loge PA Student.  I was present during the entire procedure.   Authorized by: Raelyn Number Consent: Verbal consent obtained. Risks and benefits: risks, benefits and alternatives were discussed Consent given by: patient Patient identity confirmed: provided demographic data Prepped and Draped in normal sterile fashion Wound explored  Laceration Location: Right lower leg  Laceration Length: 6-7 cm  No Foreign Bodies seen or palpated  Anesthesia: local infiltration  Local anesthetic: lidocaine 1 % without epinephrine  Anesthetic total: 5 ml  Irrigation method: syringe Amount of cleaning: standard  Skin closure: Superficial, simple interrupted   Number of sutures: 10   Technique: Area anesthetized using lidocaine without epinephrine. Wound irrigated copiously with sterile saline. Wound then cleaned with Betadine and draped in sterile fashion. Wound closed using 10 simple interrupted sutures with 3-0 Prolene.  Bacitracin and sterile dressing applied. Good wound approximation and hemostasis achieved.    Patient tolerance: Patient tolerated the procedure well with no immediate complications.    Layla Maw Ward, DO 07/03/15 0700

## 2015-07-03 NOTE — Discharge Instructions (Signed)

## 2015-07-03 NOTE — ED Notes (Signed)
The pt has a laceration to her rt lateral lower leg  She did on a mirror  Bleeding controlled

## 2015-07-14 ENCOUNTER — Telehealth: Payer: Self-pay | Admitting: Internal Medicine

## 2015-07-14 NOTE — Telephone Encounter (Signed)
Patient has stitches and would like to know how soon does she need them removed. Patient was given an appt, but it was cancelled and was attempted to be reschedule, patient did not take new appt. Please follow up.

## 2015-07-15 ENCOUNTER — Inpatient Hospital Stay: Payer: Self-pay | Admitting: Family Medicine

## 2015-07-16 NOTE — Telephone Encounter (Signed)
Patient needs an appointment

## 2015-07-17 ENCOUNTER — Emergency Department (HOSPITAL_COMMUNITY)
Admission: EM | Admit: 2015-07-17 | Discharge: 2015-07-17 | Disposition: A | Discharge status: Home / Self Care | Payer: No Typology Code available for payment source | Attending: Emergency Medicine | Admitting: Emergency Medicine

## 2015-07-17 ENCOUNTER — Encounter (HOSPITAL_COMMUNITY): Payer: Self-pay | Admitting: *Deleted

## 2015-07-17 DIAGNOSIS — Z7982 Long term (current) use of aspirin: Secondary | ICD-10-CM | POA: Insufficient documentation

## 2015-07-17 DIAGNOSIS — Z79899 Other long term (current) drug therapy: Secondary | ICD-10-CM | POA: Insufficient documentation

## 2015-07-17 DIAGNOSIS — L539 Erythematous condition, unspecified: Secondary | ICD-10-CM | POA: Insufficient documentation

## 2015-07-17 DIAGNOSIS — Z4802 Encounter for removal of sutures: Secondary | ICD-10-CM

## 2015-07-17 MED ORDER — CEPHALEXIN 500 MG PO CAPS: 500.0000 mg | ORAL_CAPSULE | Freq: Four times a day (QID) | ORAL | Status: DC

## 2015-07-17 NOTE — ED Notes (Signed)
Declined W/C at D/C and was escorted to lobby by RN. 

## 2015-07-17 NOTE — ED Provider Notes (Signed)
CSN: [id number]     Arrival date & time 07/17/15  0708 History   First MD Initiated Contact with Patient 07/17/15 [phone]     Chief Complaint  Patient presents with  . Suture / Staple Removal     (Consider location/radiation/quality/duration/timing/severity/associated sxs/prior Treatment) Patient is a 50 y.o. female presenting with suture removal.  Suture / Staple Removal This is a new problem. Episode onset: July 03, 2015. The problem has been gradually improving. Pertinent negatives include no chills, fever, nausea or vomiting. Nothing aggravates the symptoms. She has tried nothing for the symptoms.    History reviewed. No pertinent past medical history. Past Surgical History  Procedure Laterality Date  . Throat surgery      patient was 50 years old.    Family History  Problem Relation Age of Onset  . Hypertension Mother   . Heart disease Mother   . Stroke Mother   . Hypertension Daughter    Social History  Substance Use Topics  . Smoking status: Never Smoker   . Smokeless tobacco: None  . Alcohol Use: No   OB History    No data available     Review of Systems  Constitutional: Negative for fever and chills.  Gastrointestinal: Negative for nausea and vomiting.  Skin: Positive for color change (mild erythema surrounding wound) and wound.  All other systems reviewed and are negative.     Allergies  Review of patient's allergies indicates no known allergies.  Home Medications   Prior to Admission medications   Medication Sig Start Date End Date Taking? Authorizing Provider  aspirin EC 81 MG tablet Take 1 tablet (81 mg total) by mouth daily. Patient not taking: Reported on 07/03/2015 09/04/13   Richarda Overlie, MD  cephALEXin (KEFLEX) 500 MG capsule Take 1 capsule (500 mg total) by mouth 4 (four) times daily. 07/17/15   Cheri Fowler, PA-C  cloNIDine (CATAPRES) 0.1 MG tablet Take 1 tablet (0.1 mg total) by mouth 2 (two) times daily. 04/22/15   Quentin Angst, MD   diazepam (VALIUM) 5 MG tablet Take 1 tablet (5 mg total) by mouth every 8 (eight) hours as needed for muscle spasms. Patient not taking: Reported on 04/22/2015 07/12/14   Kristen N Ward, DO  hydrochlorothiazide (HYDRODIURIL) 12.5 MG tablet Take 1 tablet (12.5 mg total) by mouth daily. Patient not taking: Reported on 07/12/2014 09/04/13   Richarda Overlie, MD  ibuprofen (ADVIL,MOTRIN) 800 MG tablet Take 1 tablet (800 mg total) by mouth every 8 (eight) hours as needed for mild pain. Patient not taking: Reported on 07/03/2015 07/12/14   Layla Maw Ward, DO  oxyCODONE-acetaminophen (PERCOCET/ROXICET) 5-325 MG per tablet Take 1 tablet by mouth every 4 (four) hours as needed. Patient not taking: Reported on 04/22/2015 07/12/14   Kristen N Ward, DO   BP 126/76 mmHg  Pulse 88  Temp(Src) 98.1 F (36.7 C) (Oral)  Resp 18  Ht  (1.753 m)  Wt 76.204 kg  BMI 24.80 kg/m2  SpO2 99% Physical Exam  Constitutional: She is oriented to person, place, and time. She appears well-developed and well-nourished.  HENT:  Head: Normocephalic and atraumatic.  Right Ear: External ear normal.  Left Ear: External ear normal.  Eyes: Conjunctivae are normal. No scleral icterus.  Neck: No tracheal deviation present.  Pulmonary/Chest: Effort normal. No respiratory distress.  Abdominal: She exhibits no distension.  Musculoskeletal: Normal range of motion.  Neurological: She is alert and oriented to person, place, and time.  Skin: Skin is warm  and dry.  Well healing 6-7 cm wound with minimal surrounding erythema with 10 sutures in place.  No drainage.  No induration.  Psychiatric: She has a normal mood and affect. Her behavior is normal.    ED Course  Procedures (including critical care time)  SUTURE REMOVAL Performed by: Cheri Fowler  Consent: Verbal consent obtained. Patient identity confirmed: provided demographic data Time out: Immediately prior to procedure a "time out" was called to verify the correct patient,  procedure, equipment, support staff and site/side marked as required.  Location details: right lower leg  Wound Appearance: mild surrounding erythema and dried skin  Sutures/Staples Removed: 10  Facility: sutures placed in this facility Patient tolerance: Patient tolerated the procedure well with no immediate complications.    Labs Review Labs Reviewed - No data to display  Imaging Review No results found. I have personally reviewed and evaluated these images and lab results as part of my medical decision-making.   EKG Interpretation None      MDM   Final diagnoses:  Encounter for removal of sutures    Sutures removed.  Minimal erythema surrounding wound and dried skin.  Will cover with keflex for possible infection.  Discussed return precautions.  Patient agrees and acknowledges the above plan for discharge.     Cheri Fowler, PA-C 07/17/15 1610  Alvira Monday, MD 07/18/15 [phone]

## 2015-07-17 NOTE — Discharge Instructions (Signed)

## 2015-07-17 NOTE — ED Notes (Signed)
Pt reports she needs sutures removed. Pt reports sutures were place on 07-03-15. Pt reports pain at site and swelling in RT foot.

## 2015-08-10 ENCOUNTER — Emergency Department (INDEPENDENT_AMBULATORY_CARE_PROVIDER_SITE_OTHER)
Admission: EM | Admit: 2015-08-10 | Discharge: 2015-08-10 | Disposition: A | Discharge status: Home / Self Care | Payer: Managed Care, Other (non HMO) | Source: Home / Self Care | Attending: Family Medicine | Admitting: Family Medicine

## 2015-08-10 ENCOUNTER — Encounter (HOSPITAL_COMMUNITY): Payer: Self-pay | Admitting: Emergency Medicine

## 2015-08-10 DIAGNOSIS — S0501XA Injury of conjunctiva and corneal abrasion without foreign body, right eye, initial encounter: Secondary | ICD-10-CM

## 2015-08-10 DIAGNOSIS — H00013 Hordeolum externum right eye, unspecified eyelid: Secondary | ICD-10-CM | POA: Diagnosis not present

## 2015-08-10 DIAGNOSIS — H109 Unspecified conjunctivitis: Secondary | ICD-10-CM

## 2015-08-10 MED ORDER — TETRACAINE HCL 0.5 % OP SOLN
OPHTHALMIC | Status: AC
  Filled 2015-08-10: qty 2

## 2015-08-10 MED ORDER — HYDROCODONE-ACETAMINOPHEN 5-325 MG PO TABS: 1.0000 | ORAL_TABLET | ORAL | Status: DC | PRN

## 2015-08-10 MED ORDER — TOBRAMYCIN 0.3 % OP OINT: 1.0000 "application " | TOPICAL_OINTMENT | Freq: Four times a day (QID) | OPHTHALMIC | Status: DC

## 2015-08-10 MED ORDER — FLUORESCEIN SODIUM 1 MG OP STRP
ORAL_STRIP | OPHTHALMIC | Status: AC
  Filled 2015-08-10: qty 2

## 2015-08-10 NOTE — ED Notes (Addendum)
Pt here with stye in right upper lid and worsening with bilateral severe conjunctivitis Greenish, yellow drainage, watery eyes with itching, burning and blurry vision  Started 2 days ago, tried warm compresses Denies sleeping in contact lenses

## 2015-08-10 NOTE — Discharge Instructions (Signed)
Bacterial Conjunctivitis Bacterial conjunctivitis, commonly called pink eye, is an inflammation of the clear membrane that covers the white part of the eye (conjunctiva). The inflammation can also happen on the underside of the eyelids. The blood vessels in the conjunctiva become inflamed, causing the eye to become red or pink. Bacterial conjunctivitis may spread easily from one eye to another and from person to person (contagious).  CAUSES  Bacterial conjunctivitis is caused by bacteria. The bacteria may come from your own skin, your upper respiratory tract, or from someone else with bacterial conjunctivitis. SYMPTOMS  The normally white color of the eye or the underside of the eyelid is usually pink or red. The pink eye is usually associated with irritation, tearing, and some sensitivity to light. Bacterial conjunctivitis is often associated with a thick, yellowish discharge from the eye. The discharge may turn into a crust on the eyelids overnight, which causes your eyelids to stick together. If a discharge is present, there may also be some blurred vision in the affected eye. DIAGNOSIS  Bacterial conjunctivitis is diagnosed by your caregiver through an eye exam and the symptoms that you report. Your caregiver looks for changes in the surface tissues of your eyes, which may point to the specific type of conjunctivitis. A sample of any discharge may be collected on a cotton-tip swab if you have a severe case of conjunctivitis, if your cornea is affected, or if you keep getting repeat infections that do not respond to treatment. The sample will be sent to a lab to see if the inflammation is caused by a bacterial infection and to see if the infection will respond to antibiotic medicines. TREATMENT   Bacterial conjunctivitis is treated with antibiotics. Antibiotic eyedrops are most often used. However, antibiotic ointments are also available. Antibiotics pills are sometimes used. Artificial tears or eye  washes may ease discomfort. HOME CARE INSTRUCTIONS   To ease discomfort, apply a cool, clean washcloth to your eye for 10-20 minutes, 3-4 times a day.  Gently wipe away any drainage from your eye with a warm, wet washcloth or a cotton ball.  Wash your hands often with soap and water. Use paper towels to dry your hands.  Do not share towels or washcloths. This may spread the infection.  Change or wash your pillowcase every day.  You should not use eye makeup until the infection is gone.  Do not operate machinery or drive if your vision is blurred.  Stop using contact lenses. Ask your caregiver how to sterilize or replace your contacts before using them again. This depends on the type of contact lenses that you use.  When applying medicine to the infected eye, do not touch the edge of your eyelid with the eyedrop bottle or ointment tube. SEEK IMMEDIATE MEDICAL CARE IF:   Your infection has not improved within 3 days after beginning treatment.  You had yellow discharge from your eye and it returns.  You have increased eye pain.  Your eye redness is spreading.  Your vision becomes blurred.  You have a fever or persistent symptoms for more than 2-3 days.  You have a fever and your symptoms suddenly get worse.  You have facial pain, redness, or swelling. MAKE SURE YOU:   Understand these instructions.  Will watch your condition.  Will get help right away if you are not doing well or get worse.   This information is not intended to replace advice given to you by your health care provider. Make sure you   discuss any questions you have with your health care provider.   Document Released: 05/08/2005 Document Revised: 05/29/2014 Document Reviewed: 10/09/2011 Elsevier Interactive Patient Education 2016 [employer]  Corneal Abrasion The cornea is the clear covering at the front and center of the eye. When looking at the colored portion of the eye (iris), you are looking  through the cornea. This very thin tissue is made up of many layers. The surface layer is a single layer of cells (corneal epithelium) and is one of the most sensitive tissues in the body. If a scratch or injury causes the corneal epithelium to come off, it is called a corneal abrasion. If the injury extends to the tissues below the epithelium, the condition is called a corneal ulcer. CAUSES   Scratches.  Trauma.  Foreign body in the eye. Some people have recurrences of abrasions in the area of the original injury even after it has healed (recurrent erosion syndrome). Recurrent erosion syndrome generally improves and goes away with time. SYMPTOMS   Eye pain.  Difficulty or inability to keep the injured eye open.  The eye becomes very sensitive to light.  Recurrent erosions tend to happen suddenly, first thing in the morning, usually after waking up and opening the eye. DIAGNOSIS  Your health care provider can diagnose a corneal abrasion during an eye exam. Dye is usually placed in the eye using a drop or a small paper strip moistened by your tears. When the eye is examined with a special light, the abrasion shows up clearly because of the dye. TREATMENT   Small abrasions may be treated with antibiotic drops or ointment alone.  A pressure patch may be put over the eye. If this is done, follow your doctor's instructions for when to remove the patch. Do not drive or use machines while the eye patch is on. Judging distances is hard to do with a patch on. If the abrasion becomes infected and spreads to the deeper tissues of the cornea, a corneal ulcer can result. This is serious because it can cause corneal scarring. Corneal scars interfere with light passing through the cornea and cause a loss of vision in the involved eye. HOME CARE INSTRUCTIONS  Use medicine or ointment as directed. Only take over-the-counter or prescription medicines for pain, discomfort, or fever as directed by your  health care provider.  Do not drive or operate machinery if your eye is patched. Your ability to judge distances is impaired.  If your health care provider has given you a follow-up appointment, it is very important to keep that appointment. Not keeping the appointment could result in a severe eye infection or permanent loss of vision. If there is any problem keeping the appointment, let your health care provider know. SEEK MEDICAL CARE IF:   You have pain, light sensitivity, and a scratchy feeling in one eye or both eyes.  Your pressure patch keeps loosening up, and you can blink your eye under the patch after treatment.  Any kind of discharge develops from the eye after treatment or if the lids stick together in the morning.  You have the same symptoms in the morning as you did with the original abrasion days, weeks, or months after the abrasion healed.   This information is not intended to replace advice given to you by your health care provider. Make sure you discuss any questions you have with your health care provider.   Document Released: 05/05/2000 Document Revised: 01/27/2015 Document Reviewed: 01/13/2013 Elsevier Interactive Patient  Education 2016 [employer]  How to Use Eye Drops and Eye Ointments HOW TO APPLY EYE DROPS Follow these steps when applying eye drops:  Wash your hands.  Tilt your head back.  Put a finger under your eye and use it to gently pull your lower lid downward. Keep that finger in place.  Using your other hand, hold the dropper between your thumb and index finger.  Position the dropper just over the edge of the lower lid. Hold it as close to your eye as you can without touching the dropper to your eye.  Steady your hand. One way to do this is to lean your index finger against your brow.  Look up.  Slowly and gently squeeze one drop of medicine into your eye.  Close your eye.  Place a finger between your lower eyelid and your nose. Press  gently for 2 minutes. This increases the amount of time that the medicine is exposed to the eye. It also reduces side effects that can develop if the drop gets into the bloodstream through the nose. HOW TO APPLY EYE OINTMENTS Follow these steps when applying eye ointments:  Wash your hands.  Put a finger under your eye and use it to gently pull your lower lid downward. Keep that finger in place.  Using your other hand, place the tip of the tube between your thumb and index finger with the remaining fingers braced against your cheek or nose.  Hold the tube just over the edge of your lower lid without touching the tube to your lid or eyeball.  Look up.  Line the inner part of your lower lid with ointment.  Gently pull up on your upper lid and look down. This will force the ointment to spread over the surface of the eye.  Release the upper lid.  If you can, close your eyes for 1-2 minutes. Do not rub your eyes. If you applied the ointment correctly, your vision will be blurry for a few minutes. This is normal. ADDITIONAL INFORMATION  Make sure to use the eye drops or ointment as told by your health care provider.  If you have been told to use both eye drops and an eye ointment, apply the eye drops first, then wait 3-4 minutes before you apply the ointment.  Try not to touch the tip of the dropper or tube to your eye. A dropper or tube that has touched the eye can become contaminated.   This information is not intended to replace advice given to you by your health care provider. Make sure you discuss any questions you have with your health care provider.   Document Released: 08/14/2000 Document Revised: 09/22/2014 Document Reviewed: 05/04/2014 Elsevier Interactive Patient Education 2016 Elsevier Inc.  Stye Must use warm compresses to right eye several times a day to allow stye to drain A stye is a bump on your eyelid caused by a bacterial infection. A stye can form inside the eyelid  (internal stye) or outside the eyelid (external stye). An internal stye may be caused by an infected oil-producing gland inside your eyelid. An external stye may be caused by an infection at the base of your eyelash (hair follicle). Styes are very common. Anyone can get them at any age. They usually occur in just one eye, but you may have more than one in either eye.  CAUSES  The infection is almost always caused by bacteria called Staphylococcus aureus. This is a common type of bacteria that lives  on your skin. RISK FACTORS You may be at higher risk for a stye if you have had one before. You may also be at higher risk if you have:  Diabetes.  Long-term illness.  Long-term eye redness.  A skin condition called seborrhea.  High fat levels in your blood (lipids). SIGNS AND SYMPTOMS  Eyelid pain is the most common symptom of a stye. Internal styes are more painful than external styes. Other signs and symptoms may include:  Painful swelling of your eyelid.  A scratchy feeling in your eye.  Tearing and redness of your eye.  Pus draining from the stye. DIAGNOSIS  Your health care provider may be able to diagnose a stye just by examining your eye. The health care provider may also check to make sure:  You do not have a fever or other signs of a more serious infection.  The infection has not spread to other parts of your eye or areas around your eye. TREATMENT  Most styes will clear up in a few days without treatment. In some cases, you may need to use antibiotic drops or ointment to prevent infection. Your health care provider may have to drain the stye surgically if your stye is:  Large.  Causing a lot of pain.  Interfering with your vision. This can be done using a thin blade or a needle.  HOME CARE INSTRUCTIONS   Take medicines only as directed by your health care provider.  Apply a clean, warm compress to your eye for 10 minutes, 4 times a day.  Do not wear contact lenses  or eye makeup until your stye has healed.  Do not try to pop or drain the stye. SEEK MEDICAL CARE IF:  You have chills or a fever.  Your stye does not go away after several days.  Your stye affects your vision.  Your eyeball becomes swollen, red, or painful. MAKE SURE YOU:  Understand these instructions.  Will watch your condition.  Will get help right away if you are not doing well or get worse.   This information is not intended to replace advice given to you by your health care provider. Make sure you discuss any questions you have with your health care provider.   Document Released: 02/15/2005 Document Revised: 05/29/2014 Document Reviewed: 08/22/2013 Elsevier Interactive Patient Education [employer]

## 2015-08-10 NOTE — ED Provider Notes (Signed)
CSN: [id number]     Arrival date & time 08/10/15  1945 History   First MD Initiated Contact with Patient 08/10/15 2138     Chief Complaint  Patient presents with  . Conjunctivitis  . Stye   (Consider location/radiation/quality/duration/timing/severity/associated sxs/prior Treatment) HPI Comments: 50 year old female complaining of right eye pain with redness and watering since yesterday. She states she believes she has a stye on her eye. When asked about her left eye she states primarily his her right eye was bothering her. She was asked if she was having pain or problems with vision in the left eye she states I "do not know you tell me you are the doctor."  She feels as though there may be a foreign body in the left eye.   History reviewed. No pertinent past medical history. Past Surgical History  Procedure Laterality Date  . Throat surgery      patient was 50 years old.    Family History  Problem Relation Age of Onset  . Hypertension Mother   . Heart disease Mother   . Stroke Mother   . Hypertension Daughter    Social History  Substance Use Topics  . Smoking status: Never Smoker   . Smokeless tobacco: None  . Alcohol Use: No   OB History    No data available     Review of Systems  Constitutional: Positive for activity change. Negative for fever.  HENT: Negative.   Eyes: Positive for photophobia, discharge, redness, itching and visual disturbance.  Respiratory: Negative.     Allergies  Review of patient's allergies indicates no known allergies.  Home Medications   Prior to Admission medications   Medication Sig Start Date End Date Taking? Authorizing Provider  cephALEXin (KEFLEX) 500 MG capsule Take 1 capsule (500 mg total) by mouth 4 (four) times daily. 07/17/15   Cheri Fowler, PA-C  cloNIDine (CATAPRES) 0.1 MG tablet Take 1 tablet (0.1 mg total) by mouth 2 (two) times daily. 04/22/15   Quentin Angst, MD  HYDROcodone-acetaminophen (NORCO/VICODIN) 5-325 MG tablet  Take 1 tablet by mouth every 4 (four) hours as needed. 08/10/15   Hayden Rasmussen, NP  tobramycin (TOBREX) 0.3 % ophthalmic ointment Place 1 application into both eyes 4 (four) times daily. 08/10/15   Hayden Rasmussen, NP   Meds Ordered and Administered this Visit  Medications - No data to display  BP 147/88 mmHg  Pulse 73  Temp(Src) 98.1 F (36.7 C) (Oral)  SpO2 95% No data found.   Physical Exam  Constitutional: She appears well-developed and well-nourished. No distress.  Eyes: EOM are normal. Pupils are equal, round, and reactive to light.  Bilateral conjunctiva of upper and lower lids with erythema and swelling. Bilateral sclera are injected. The right upper lid with an external hordeolum.  Left eye observed blue light after tetracaine enforcing applied shows pinpoint defect at the 12:00 position of the cornea. There is a similar defect to the mid cornea of the right eye. Full EOMI. No limbal flush. There is a copious amount of thick mucoid greenish brown discharge from both eyes.  Neck: Normal range of motion. Neck supple.  Cardiovascular: Normal rate.   Neurological: She is alert. She exhibits normal muscle tone.  Skin: Skin is warm and dry.  Psychiatric: Her mood appears anxious. Her affect is blunt and labile. She is agitated.  Easily distracted. Interruptive when giving instructions and information.    Nursing note and vitals reviewed.   ED Course  Procedures (including critical  care time)  Labs Review Labs Reviewed - No data to display  Imaging Review No results found.   Visual Acuity Review  Right Eye Distance:   Left Eye Distance:   Bilateral Distance:    Right Eye Near:   Left Eye Near:    Bilateral Near:         MDM   1. Bilateral conjunctivitis   2. Corneal abrasion, right, initial encounter   3. Hordeolum external, right    Meds ordered this encounter  Medications  . tobramycin (TOBREX) 0.3 % ophthalmic ointment    Sig: Place 1 application into both  eyes 4 (four) times daily.    Dispense:  3.5 g    Refill:  0    Order Specific Question:  Supervising Provider    Answer:  Linna HoffKINDL, JAMES D [phone][[id number]]  . HYDROcodone-acetaminophen (NORCO/VICODIN) 5-325 MG tablet    Sig: Take 1 tablet by mouth every 4 (four) hours as needed.    Dispense:  8 tablet    Refill:  0    Order Specific Question:  Supervising Provider    Answer:  Linna HoffKINDL, JAMES D [[id number]]   Warm compresses to eyes Follow-up with the ophthalmologist on page one if he continues to have problems tomorrow or the following day.   Hayden Rasmussenavid Saidi Santacroce, NP 08/10/15 2215 The following day the patient called back and upset because the pharmacies did not have her medication. Even though it was explained to her at least twice that she would be  receiving an antibiotic ointment for her eyes due to the amount of drainage that she had she later stated that she did not receive that information and would rather have drops. It was explained to her that her healing may be delayed father drops that she has to much watering and drainage. She insisted that another antibiotic be called in as a drop. Polytrim [facility] [address] in [place] per patient's request.  Hayden Rasmussenavid Winnona Wargo, NP 08/11/15 1354  Hayden Rasmussenavid Andros Channing, NP 08/11/15 1356

## 2015-08-11 NOTE — ED Notes (Addendum)
Pt called   And  Spoke  To david  mabe   About  The  rx      -     polytrim   Eye  Drops   1  Drop  Both  Eyes  4  X  A  Day  X  5  Days      [facility]    [facility]  [address]

## 2015-08-23 ENCOUNTER — Other Ambulatory Visit: Payer: Self-pay

## 2015-08-23 DIAGNOSIS — Z1231 Encounter for screening mammogram for malignant neoplasm of breast: Secondary | ICD-10-CM

## 2015-09-09 ENCOUNTER — Ambulatory Visit: Payer: Managed Care, Other (non HMO) | Admitting: Internal Medicine

## 2015-09-10 ENCOUNTER — Ambulatory Visit
Admission: RE | Admit: 2015-09-10 | Discharge: 2015-09-10 | Disposition: A | Discharge status: Home / Self Care | Payer: Managed Care, Other (non HMO) | Source: Ambulatory Visit

## 2015-09-10 DIAGNOSIS — Z1231 Encounter for screening mammogram for malignant neoplasm of breast: Secondary | ICD-10-CM

## 2015-09-20 ENCOUNTER — Emergency Department (HOSPITAL_COMMUNITY)
Admission: EM | Admit: 2015-09-20 | Discharge: 2015-09-20 | Disposition: A | Discharge status: Home / Self Care | Payer: Managed Care, Other (non HMO) | Attending: Emergency Medicine | Admitting: Emergency Medicine

## 2015-09-20 ENCOUNTER — Encounter (HOSPITAL_COMMUNITY): Payer: Self-pay | Admitting: Emergency Medicine

## 2015-09-20 DIAGNOSIS — H10023 Other mucopurulent conjunctivitis, bilateral: Secondary | ICD-10-CM | POA: Diagnosis not present

## 2015-09-20 DIAGNOSIS — Z792 Long term (current) use of antibiotics: Secondary | ICD-10-CM | POA: Diagnosis not present

## 2015-09-20 DIAGNOSIS — H578 Other specified disorders of eye and adnexa: Secondary | ICD-10-CM | POA: Diagnosis present

## 2015-09-20 DIAGNOSIS — H109 Unspecified conjunctivitis: Secondary | ICD-10-CM

## 2015-09-20 DIAGNOSIS — Z79899 Other long term (current) drug therapy: Secondary | ICD-10-CM | POA: Diagnosis not present

## 2015-09-20 MED ORDER — IBUPROFEN 400 MG PO TABS
600.0000 mg | ORAL_TABLET | Freq: Once | ORAL | Status: AC
  Administered 2015-09-20: 600 mg via ORAL
  Filled 2015-09-20: qty 1

## 2015-09-20 MED ORDER — MOXIFLOXACIN HCL 0.5 % OP SOLN: 1.0000 [drp] | OPHTHALMIC | Status: DC

## 2015-09-20 NOTE — ED Notes (Signed)
Pt reports itchy, swollen, red eyes with drainage for the last 24 hours.

## 2015-09-20 NOTE — ED Provider Notes (Signed)
CSN: [id number]     Arrival date & time 09/20/15  1226 History  By signing my name below, I, Bethel Born, attest that this documentation has been prepared under the direction and in the presence of Olander Friedl PA-C. Electronically Signed: Bethel Born, ED Scribe. 09/20/2015 1:12 PM   Chief Complaint  Patient presents with  . Conjunctivitis   Patient is a 50 y.o. female presenting with eye problem. The history is provided by the patient. No language interpreter was used.  Eye Problem Location:  Both Quality: Irritating. Duration:  1 day Timing:  Constant Progression:  Unchanged Context: contact lenses   Relieved by:  None tried Ineffective treatments:  None tried Associated symptoms: crusting, discharge, itching, redness and tearing   Associated symptoms: no blurred vision, no decreased vision, no double vision, no facial rash, no foreign body sensation and no photophobia    Brandi Collins is a 50 y.o. female presents to the Emergency Department complaining of bilateral eye redness, discharge and irritation with onset yesterday. Pt states that her eyelids are sore and she has had tearing, white-yellow drainage, and crusting. The symptoms started in both eyes simultaneously. 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  She denies eye pain or significant change in vision. She is a contact lens wearer but did not put them in today due to her symptoms. She endorses history of seasonal allergies with clear rhinorrhea. Denies fevers, chills, URI symptoms, or any other complaints.   History reviewed. No pertinent past medical history. Past Surgical History  Procedure Laterality Date  . Throat surgery      patient was 50 years old.    Family History  Problem Relation Age of Onset  . Hypertension Mother   . Heart disease Mother   . Stroke Mother   . Hypertension Daughter    Social History  Substance Use Topics  . Smoking status: Never Smoker   . Smokeless  tobacco: None  . Alcohol Use: No   OB History    No data available     Review of Systems  Eyes: Positive for discharge, redness and itching. Negative for blurred vision, double vision, photophobia, pain and visual disturbance.  All other systems reviewed and are negative.   Allergies  Review of patient's allergies indicates no known allergies.  Home Medications   Prior to Admission medications   Medication Sig Start Date End Date Taking? Authorizing Provider  cephALEXin (KEFLEX) 500 MG capsule Take 1 capsule (500 mg total) by mouth 4 (four) times daily. 07/17/15   Cheri Fowler, PA-C  cloNIDine (CATAPRES) 0.1 MG tablet Take 1 tablet (0.1 mg total) by mouth 2 (two) times daily. 04/22/15   Quentin Angst, MD  HYDROcodone-acetaminophen (NORCO/VICODIN) 5-325 MG tablet Take 1 tablet by mouth every 4 (four) hours as needed. 08/10/15   Hayden Rasmussen, NP  moxifloxacin (VIGAMOX) 0.5 % ophthalmic solution Place 1 drop into both eyes every 2 (two) hours while awake. 1 drop in each eye every two hours while awake for first two days then every 4-8 hours for 5 days 09/20/15   Rolm Gala Kalissa Grays, PA-C  tobramycin (TOBREX) 0.3 % ophthalmic ointment Place 1 application into both eyes 4 (four) times daily. 08/10/15   Hayden Rasmussen, NP   BP 150/90 mmHg  Pulse 86  Temp(Src) 98.5 F (36.9 C)  Resp 20  Ht  (1.727 m)  Wt 166 lb (75.297 kg)  BMI 25.25 kg/m2  SpO2 100% Physical  Exam  Constitutional: She appears well-developed and well-nourished. No distress.  HENT:  Head: Normocephalic and atraumatic.  Right Ear: External ear normal.  Left Ear: External ear normal.  Eyes: EOM are normal. Pupils are equal, round, and reactive to light. Right eye exhibits discharge. Right eye exhibits no chemosis. Left eye exhibits discharge. Left eye exhibits no chemosis. Right conjunctiva is injected. Left conjunctiva is injected. No scleral icterus.  Bilateral conjunctival with yellow mucoid discharge. EOM intact and  non-painful. No periorbital swelling or erythema. PERRL. Visual acuity diminished but pt states she is not wearing corrective lenses and this is baseline for her vision. No discrepencies between the eyes.     Visual Acuity  Right Eye Distance: 20/200 without corrective lens Left Eye Distance: 20/200 without corrective lens Bilateral Distance: 20/200 without corrective lens   Neck: Normal range of motion.  Cardiovascular: Normal rate.   Pulmonary/Chest: Effort normal.  Musculoskeletal: Normal range of motion.  Moves all extremities spontaneously  Neurological: She is alert. Coordination normal.  Skin: Skin is warm and dry.  Psychiatric: She has a normal mood and affect. Her behavior is normal.  Nursing note and vitals reviewed.   ED Course  Procedures (including critical care time) DIAGNOSTIC STUDIES: Oxygen Saturation is 100% on RA,  normal by my interpretation.    COORDINATION OF CARE: 1:09 PM Discussed treatment plan which includes abx ointment with pt at bedside and pt agreed to plan.  Labs Review Labs Reviewed - No data to display  Imaging Review No results found.    EKG Interpretation None       MDM   Final diagnoses:  Bacterial conjunctivitis   Brandi Collins presents with symptoms consistent with bacterial conjunctivitis. Injected conjunctiva and purulent discharge noted on exam. EOM non-painful with no concern for entrapment. No evidence of preseptal or orbital cellulitis. Pt is a contact lens wearer.  Patient will be given moxifloxacin drops.  Personal hygiene and frequent handwashing discussed.  Patient advised to followup with ophthalmologist for reevaluation in the next few days. Patient verbalizes understanding and is agreeable with discharge.  I personally performed the services described in this documentation, which was scribed in my presence. The recorded information has been reviewed and is accurate.   Alveta HeimlichStevi Wyeth Hoffer, PA-C 09/20/15 1349  Benjiman CoreNathan  Pickering, MD 09/20/15 1558

## 2015-09-20 NOTE — Discharge Instructions (Signed)

## 2015-10-11 ENCOUNTER — Ambulatory Visit (HOSPITAL_COMMUNITY)
Admission: EM | Admit: 2015-10-11 | Discharge: 2015-10-11 | Disposition: A | Discharge status: Home / Self Care | Payer: Managed Care, Other (non HMO) | Attending: Family Medicine | Admitting: Family Medicine

## 2015-10-11 ENCOUNTER — Encounter (HOSPITAL_COMMUNITY): Payer: Self-pay | Admitting: *Deleted

## 2015-10-11 DIAGNOSIS — M545 Low back pain, unspecified: Secondary | ICD-10-CM

## 2015-10-11 HISTORY — DX: Attention-deficit hyperactivity disorder, unspecified type: F90.9

## 2015-10-11 MED ORDER — NAPROXEN SODIUM 550 MG PO TABS: 550.0000 mg | ORAL_TABLET | Freq: Two times a day (BID) | ORAL | Status: DC

## 2015-10-11 MED ORDER — CYCLOBENZAPRINE HCL 10 MG PO TABS: 10.0000 mg | ORAL_TABLET | Freq: Two times a day (BID) | ORAL | Status: DC | PRN

## 2015-10-11 NOTE — ED Notes (Signed)
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 CMS intact distally.

## 2015-10-11 NOTE — Discharge Instructions (Signed)
Back Pain, Adult Back pain is very common in adults.The cause of back pain is rarely dangerous and the pain often gets better over time.The cause of your back pain may not be known. Some common causes of back pain include:  Strain of the muscles or ligaments supporting the spine.  Wear and tear (degeneration) of the spinal disks.  Arthritis.  Direct injury to the back. For many people, back pain may return. Since back pain is rarely dangerous, most people can learn to manage this condition on their own. HOME CARE INSTRUCTIONS Watch your back pain for any changes. The following actions may help to lessen any discomfort you are feeling:  Remain active. It is stressful on your back to sit or stand in one place for long periods of time. Do not sit, drive, or stand in one place for more than 30 minutes at a time. Take short walks on even surfaces as soon as you are able.Try to increase the length of time you walk each day.  Exercise regularly as directed by your health care provider. Exercise helps your back heal faster. It also helps avoid future injury by keeping your muscles strong and flexible.  Do not stay in bed.Resting more than 1-2 days can delay your recovery.  Pay attention to your body when you bend and lift. The most comfortable positions are those that put less stress on your recovering back. Always use proper lifting techniques, including:  Bending your knees.  Keeping the load close to your body.  Avoiding twisting.  Find a comfortable position to sleep. Use a firm mattress and lie on your side with your knees slightly bent. If you lie on your back, put a pillow under your knees.  Avoid feeling anxious or stressed.Stress increases muscle tension and can worsen back pain.It is important to recognize when you are anxious or stressed and learn ways to manage it, such as with exercise.  Take medicines only as directed by your health care provider. Over-the-counter  medicines to reduce pain and inflammation are often the most helpful.Your health care provider may prescribe muscle relaxant drugs.These medicines help dull your pain so you can more quickly return to your normal activities and healthy exercise.  Apply ice to the injured area:  Put ice in a plastic bag.  Place a towel between your skin and the bag.  Leave the ice on for 20 minutes, 2-3 times a day for the first 2-3 days. After that, ice and heat may be alternated to reduce pain and spasms.  Maintain a healthy weight. Excess weight puts extra stress on your back and makes it difficult to maintain good posture. SEEK MEDICAL CARE IF:  You have pain that is not relieved with rest or medicine.  You have increasing pain going down into the legs or buttocks.  You have pain that does not improve in one week.  You have night pain.  You lose weight.  You have a fever or chills. SEEK IMMEDIATE MEDICAL CARE IF:   You develop new bowel or bladder control problems.  You have unusual weakness or numbness in your arms or legs.  You develop nausea or vomiting.  You develop abdominal pain.  You feel faint.   This information is not intended to replace advice given to you by your health care provider. Make sure you discuss any questions you have with your health care provider.   Document Released: 05/08/2005 Document Revised: 05/29/2014 Document Reviewed: 09/09/2013 Elsevier Interactive Patient Education 2016 [employer]  Inc[employer] ° °Back Injury Prevention °Back injuries can be very painful. They can also be difficult to heal. After having one back injury, you are more likely to injure your back again. It is important to learn how to avoid injuring or re-injuring your back. The following tips can help you to prevent a back injury. °WHAT SHOULD I KNOW ABOUT PHYSICAL FITNESS? °· Exercise for 30 minutes per day on most days of the week or as directed by your health care provider. Make sure to: °¨ Do  aerobic exercises, such as walking, jogging, biking, or swimming. °¨ Do exercises that increase balance and strength, such as tai chi and yoga. These can decrease your risk of falling and injuring your back. °¨ Do stretching exercises to help with flexibility. °¨ Try to develop strong abdominal muscles. Your abdominal muscles provide a lot of the support that is needed by your back. °· Maintain a healthy weight.  This helps to decrease your risk of a back injury. °WHAT SHOULD I KNOW ABOUT MY DIET? °· Talk with your health care provider about your overall diet. Take supplements and vitamins only as directed by your health care provider. °· Talk with your health care provider about how much calcium and vitamin D you need each day. These nutrients help to prevent weakening of the bones (osteoporosis). Osteoporosis can cause broken (fractured) bones, which lead to back pain. °· Include good sources of calcium in your diet, such as dairy products, green leafy vegetables, and products that have had calcium added to them (fortified). °· Include good sources of vitamin D in your diet, such as milk and foods that are fortified with vitamin D. °WHAT SHOULD I KNOW ABOUT MY POSTURE? °· Sit up straight and stand up straight. Avoid leaning forward when you sit or hunching over when you stand. °· Choose chairs that have good low-back (lumbar) support. °· If you work at a desk, sit close to it so you do not need to lean over. Keep your chin tucked in. Keep your neck drawn back, and keep your elbows bent at a right angle. Your arms should look like the letter "L." °· Sit high and close to the steering wheel when you drive. Add a lumbar support to your car seat, if needed. °· Avoid sitting or standing in one position for very long. Take breaks to get up, stretch, and walk around at least one time every hour. Take breaks every hour if you are driving for long periods of time. °· Sleep on your side with your knees slightly bent, or  sleep on your back with a pillow under your knees. Do not lie on the front of your body to sleep. °WHAT SHOULD I KNOW ABOUT LIFTING, TWISTING, AND REACHING? °Lifting and Heavy Lifting °· Avoid heavy lifting, especially repetitive heavy lifting. If you must do heavy lifting: °¨ Stretch before lifting. °¨ Work slowly. °¨ Rest between lifts. °¨ Use a tool such as a cart or a dolly to move objects if one is available. °¨ Make several small trips instead of carrying one heavy load. °¨ Ask for help when you need it, especially when moving big objects. °· Follow these steps when lifting: °¨ Stand with your feet shoulder-width apart. °¨ Get as close to the object as you can. Do not try to pick up a heavy object that is far from your body. °¨ Use handles or lifting straps if they are available. °¨ Bend at your knees. Squat down, but keep your heels   off the floor. °¨ Keep your shoulders pulled back, your chin tucked in, and your back straight. °¨ Lift the object slowly while you tighten the muscles in your legs, abdomen, and buttocks. Keep the object as close to the center of your body as possible. °· Follow these steps when putting down a heavy load: °¨ Stand with your feet shoulder-width apart. °¨ Lower the object slowly while you tighten the muscles in your legs, abdomen, and buttocks. Keep the object as close to the center of your body as possible. °¨ Keep your shoulders pulled back, your chin tucked in, and your back straight. °¨ Bend at your knees. Squat down, but keep your heels off the floor. °¨ Use handles or lifting straps if they are available. °Twisting and Reaching °· Avoid lifting heavy objects above your waist. °· Do not twist at your waist while you are lifting or carrying a load. If you need to turn, move your feet. °· Do not bend over without bending at your knees. °· Avoid reaching over your head, across a table, or for an object on a high surface. °WHAT ARE SOME OTHER TIPS? °· Avoid wet floors and icy  ground. Keep sidewalks clear of ice to prevent falls. °· Do not sleep on a mattress that is too soft or too hard. °· Keep items that are used frequently within easy reach. °· Put heavier objects on shelves at waist level, and put lighter objects on lower or higher shelves. °· Find ways to decrease your stress, such as exercise, massage, or relaxation techniques. Stress can build up in your muscles. Tense muscles are more vulnerable to injury. °· Talk with your health care provider if you feel anxious or depressed. These conditions can make back pain worse. °· Wear flat heel shoes with cushioned soles. °· Avoid sudden movements. °· Use both shoulder straps when carrying a backpack. °· Do not use any tobacco products, including cigarettes, chewing tobacco, or electronic cigarettes. If you need help quitting, ask your health care provider. °  °This information is not intended to replace advice given to you by your health care provider. Make sure you discuss any questions you have with your health care provider. °  °Document Released: 06/15/2004 Document Revised: 09/22/2014 Document Reviewed: 05/12/2014 °Elsevier Interactive Patient Education ©2016 [employer] Inc. ° °

## 2015-10-11 NOTE — ED Provider Notes (Signed)
CSN: [id number]     Arrival date & time 10/11/15  1853 History   First MD Initiated Contact with Patient 10/11/15 1933     Chief Complaint  Patient presents with  . Back Pain   (Consider location/radiation/quality/duration/timing/severity/associated sxs/prior Treatment) HPI History obtained from patient:  Pt presents with the cc XL:KGMW pain Duration of symptoms:at least 1 week Treatment prior to arrival:OTC Medications without much relief including icy hot  Context: Sudden onset of pain no known injury Other symptoms include: Pain radiates across back at times Pain score: 3 FAMILY HISTORY: Hypertension in her mother     Past Medical History  Diagnosis Date  . ADHD (attention deficit hyperactivity disorder)    Past Surgical History  Procedure Laterality Date  . Throat surgery      patient was 50 years old.    Family History  Problem Relation Age of Onset  . Hypertension Mother   . Heart disease Mother   . Stroke Mother   . Hypertension Daughter    Social History  Substance Use Topics  . Smoking status: Never Smoker   . Smokeless tobacco: None  . Alcohol Use: No   OB History    No data available     Review of Systems  Denies: HEADACHE, NAUSEA, ABDOMINAL PAIN, CHEST PAIN, CONGESTION, DYSURIA, SHORTNESS OF BREATH  Allergies  Review of patient's allergies indicates no known allergies.  Home Medications   Prior to Admission medications   Medication Sig Start Date End Date Taking? Authorizing Provider  cephALEXin (KEFLEX) 500 MG capsule Take 1 capsule (500 mg total) by mouth 4 (four) times daily. 07/17/15   Cheri Fowler, PA-C  cloNIDine (CATAPRES) 0.1 MG tablet Take 1 tablet (0.1 mg total) by mouth 2 (two) times daily. 04/22/15   Quentin Angst, MD  HYDROcodone-acetaminophen (NORCO/VICODIN) 5-325 MG tablet Take 1 tablet by mouth every 4 (four) hours as needed. 08/10/15   Hayden Rasmussen, NP  moxifloxacin (VIGAMOX) 0.5 % ophthalmic solution Place 1 drop into both eyes  every 2 (two) hours while awake. 1 drop in each eye every two hours while awake for first two days then every 4-8 hours for 5 days 09/20/15   Rolm Gala Barrett, PA-C  tobramycin (TOBREX) 0.3 % ophthalmic ointment Place 1 application into both eyes 4 (four) times daily. 08/10/15   Hayden Rasmussen, NP   Meds Ordered and Administered this Visit  Medications - No data to display  BP 136/85 mmHg  Pulse 72  Temp(Src) 98.1 F (36.7 C) (Oral)  Resp 16  SpO2 96% No data found.   Physical Exam NURSES NOTES AND VITAL SIGNS REVIEWED. CONSTITUTIONAL: Well developed, well nourished, no acute distress HEENT: normocephalic, atraumatic EYES: Conjunctiva normal NECK:normal ROM, supple, no adenopathy PULMONARY:No respiratory distress, normal effort ABDOMINAL: Soft, ND, NT BS+, No CVAT MUSCULOSKELETAL: Normal ROM of all extremities, left lower back   tenderness to palpation. No visible or palpable spasm or deformity noted. SKIN: warm and dry without rash PSYCHIATRIC: Mood and affect, behavior are normal  ED Course  Procedures (including critical care time)  Labs Review Labs Reviewed - No data to display  Imaging Review No results found.   Visual Acuity Review  Right Eye Distance:   Left Eye Distance:   Bilateral Distance:    Right Eye Near:   Left Eye Near:    Bilateral Near:       Prescriptions for Flexeril and Anaprox provided.  MDM   1. Left-sided low back pain without sciatica  Patient is reassured that there are no issues that require transfer to higher level of care at this time or additional tests. Patient is advised to continue home symptomatic treatment. Patient is advised that if there are new or worsening symptoms to attend the emergency department, contact primary care provider, or return to UC. Instructions of care provided discharged home in stable condition.    THIS NOTE WAS GENERATED USING A VOICE RECOGNITION SOFTWARE PROGRAM. ALL REASONABLE EFFORTS  WERE MADE TO  PROOFREAD THIS DOCUMENT FOR ACCURACY.  I have verbally reviewed the discharge instructions with the patient. A printed AVS was given to the patient.  All questions were answered prior to discharge.      Tharon AquasFrank C Sharla Tankard, PA 10/12/15 1420  Tharon AquasFrank C Caitriona Sundquist, GeorgiaPA 10/12/15 1421

## 2015-10-26 ENCOUNTER — Encounter (HOSPITAL_COMMUNITY): Payer: Self-pay | Admitting: Emergency Medicine

## 2015-10-26 ENCOUNTER — Ambulatory Visit (HOSPITAL_COMMUNITY)
Admission: EM | Admit: 2015-10-26 | Discharge: 2015-10-26 | Disposition: A | Discharge status: Home / Self Care | Payer: Managed Care, Other (non HMO) | Attending: Emergency Medicine | Admitting: Emergency Medicine

## 2015-10-26 DIAGNOSIS — M545 Low back pain, unspecified: Secondary | ICD-10-CM

## 2015-10-26 DIAGNOSIS — S39012D Strain of muscle, fascia and tendon of lower back, subsequent encounter: Secondary | ICD-10-CM

## 2015-10-26 DIAGNOSIS — T148 Other injury of unspecified body region: Secondary | ICD-10-CM | POA: Diagnosis not present

## 2015-10-26 DIAGNOSIS — T148XXA Other injury of unspecified body region, initial encounter: Secondary | ICD-10-CM

## 2015-10-26 MED ORDER — CYCLOBENZAPRINE HCL 5 MG PO TABS: 5.0000 mg | ORAL_TABLET | Freq: Three times a day (TID) | ORAL | Status: DC | PRN

## 2015-10-26 MED ORDER — NAPROXEN 375 MG PO TABS: 375.0000 mg | ORAL_TABLET | Freq: Two times a day (BID) | ORAL | Status: DC

## 2015-10-26 NOTE — ED Notes (Signed)
[facility] a [facility] lower back pain and pain across her shoulder blades for 1.5 months.

## 2015-10-26 NOTE — ED Provider Notes (Signed)
CSN: [id number]     Arrival date & time 10/26/15  1940 History   First MD Initiated Contact with Patient 10/26/15 2023     Chief Complaint  Patient presents with  . Back Pain   (Consider location/radiation/quality/duration/timing/severity/associated sxs/prior Treatment) HPI Comments: 50 year old female presents to the urgent care for the second time with complaints of pain across the lower back now radiating up to the parathoracic muscles. She states that she was treated with muscle relaxants and anti-inflammatories in the urgent care and after taking those medicines and being out of work for 4 day she felt great. She was able to do all the things that she usually can do. 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 Pain is worse with movement, pulling and bending. Pain radiates across the paralumbar musculature and right parathoracic muscles. Denies any known traumatic injury, blunt object force injury, fall or other accident. Denies focal paresthesias or weakness. She is fully awake and alert very energetic speech, hyperactive movements and demonstrates no limitation in movement of her back or extremities.   Past Medical History  Diagnosis Date  . ADHD (attention deficit hyperactivity disorder)    Past Surgical History  Procedure Laterality Date  . Throat surgery      patient was 50 years old.    Family History  Problem Relation Age of Onset  . Hypertension Mother   . Heart disease Mother   . Stroke Mother   . Hypertension Daughter    Social History  Substance Use Topics  . Smoking status: Never Smoker   . Smokeless tobacco: None  . Alcohol Use: No   OB History    No data available     Review of Systems  Constitutional: Negative.  Negative for fever, chills and activity change.  HENT: Negative.   Respiratory: Negative.   Musculoskeletal: Positive  for myalgias and back pain.       As per HPI  Skin: Negative for color change, pallor and rash.  Neurological: Negative.   All other systems reviewed and are negative.   Allergies  Review of patient's allergies indicates no known allergies.  Home Medications   Prior to Admission medications   Medication Sig Start Date End Date Taking? Authorizing Provider  cloNIDine (CATAPRES) 0.1 MG tablet Take 1 tablet (0.1 mg total) by mouth 2 (two) times daily. 04/22/15  Yes Olugbemiga Annitta Needs, MD  cephALEXin (KEFLEX) 500 MG capsule Take 1 capsule (500 mg total) by mouth 4 (four) times daily. 07/17/15   Cheri Fowler, PA-C  cyclobenzaprine (FLEXERIL) 5 MG tablet Take 1 tablet (5 mg total) by mouth 3 (three) times daily as needed for muscle spasms. 10/26/15   Hayden Rasmussen, NP  HYDROcodone-acetaminophen (NORCO/VICODIN) 5-325 MG tablet Take 1 tablet by mouth every 4 (four) hours as needed. 08/10/15   Hayden Rasmussen, NP  moxifloxacin (VIGAMOX) 0.5 % ophthalmic solution Place 1 drop into both eyes every 2 (two) hours while awake. 1 drop in each eye every two hours while awake for first two days then every 4-8 hours for 5 days 09/20/15   Rolm Gala Barrett, PA-C  naproxen (NAPROSYN) 375 MG tablet Take 1 tablet (375 mg total) by mouth 2 (two) times daily. 10/26/15   Hayden Rasmussen, NP  naproxen sodium (ANAPROX DS) 550 MG tablet Take 1 tablet (550 mg total) by mouth 2 (two) times  daily with a meal. 10/11/15   Tharon AquasFrank C Patrick, PA  tobramycin (TOBREX) 0.3 % ophthalmic ointment Place 1 application into both eyes 4 (four) times daily. 08/10/15   Hayden Rasmussenavid Becket Wecker, NP   Meds Ordered and Administered this Visit  Medications - No data to display  BP 147/91 mmHg  Pulse 82  Temp(Src) 98.2 F (36.8 C) (Oral)  Resp 18  SpO2 96% No data found.   Physical Exam  Constitutional: She is oriented to person, place, and time. She appears well-developed and well-nourished. No distress.  HENT:  Head: Normocephalic and atraumatic.  Eyes: EOM are normal.  Pupils are equal, round, and reactive to light.  Neck: Normal range of motion. Neck supple.  Cardiovascular: Normal rate.   Pulmonary/Chest: Effort normal.  Musculoskeletal: Normal range of motion. She exhibits no edema.  Inspection reveals no deformity, swelling or discoloration of the back. Lightest palpation to the scan and adipose layer produces pain and causes the patient to jump, jerking twitch and all sorts of contorted positions with her spine. Palpation of the spine reveals no palpable deformity. She is able to flex beyond 90 without pain. She is able to get onto and off the exam table rapidly without assistance and without apparent limitation or pain. Lower extremity strength is symmetric and 5 over 5. Ambulatory with normal gait. Tenderness to the paralumbar and thoracic musculature.  Neurological: She is alert and oriented to person, place, and time. No cranial nerve deficit.  Skin: Skin is warm and dry.  Psychiatric: She has a normal mood and affect.  Nursing note and vitals reviewed.   ED Course  Procedures (including critical care time)  Labs Review Labs Reviewed - No data to display  Imaging Review No results found.   Visual Acuity Review  Right Eye Distance:   Left Eye Distance:   Bilateral Distance:    Right Eye Near:   Left Eye Near:    Bilateral Near:         MDM   1. Muscle strain   2. Bilateral low back pain without sciatica   3. Lumbar strain, subsequent encounter    Meds ordered this encounter  Medications  . cyclobenzaprine (FLEXERIL) 5 MG tablet    Sig: Take 1 tablet (5 mg total) by mouth 3 (three) times daily as needed for muscle spasms.    Dispense:  20 tablet    Refill:  0    Order Specific Question:  Supervising Provider    Answer:  Charm RingsHONIG, ERIN J [id number]]  . naproxen (NAPROSYN) 375 MG tablet    Sig: Take 1 tablet (375 mg total) by mouth 2 (two) times daily.    Dispense:  20 tablet    Refill:  0    Order Specific Question:   Supervising Provider    Answer:  Randal BubaHONIG, ERIN J [[id number]]   Heat and stretches, OOW for 3 d. Must see PCP   Hayden Rasmussenavid Tylene Quashie, NP 10/26/15 2049

## 2015-10-26 NOTE — Discharge Instructions (Signed)

## 2015-11-02 ENCOUNTER — Ambulatory Visit (HOSPITAL_COMMUNITY)
Admission: EM | Admit: 2015-11-02 | Discharge: 2015-11-02 | Disposition: A | Discharge status: Home / Self Care | Payer: Managed Care, Other (non HMO) | Attending: Family Medicine | Admitting: Family Medicine

## 2015-11-02 ENCOUNTER — Encounter (HOSPITAL_COMMUNITY): Payer: Self-pay | Admitting: Emergency Medicine

## 2015-11-02 DIAGNOSIS — M545 Low back pain, unspecified: Secondary | ICD-10-CM

## 2015-11-02 NOTE — Discharge Instructions (Signed)

## 2015-11-02 NOTE — ED Provider Notes (Signed)
CSN: [id number]     Arrival date & time 11/02/15  1323 History   First MD Initiated Contact with Patient 11/02/15 1331     Chief Complaint  Patient presents with  . Follow-up   (Consider location/radiation/quality/duration/timing/severity/associated sxs/prior Treatment) Patient is a 50 y.o. female presenting with back pain. The history is provided by the patient. No language interpreter was used.  Back Pain Location:  Lumbar spine Quality:  Aching Radiates to:  Does not radiate Pain severity:  No pain Progression:  Resolved Context: physical stress   Relieved by:  Nothing Pt treated here for low back pain.  Pain has resolved.   Pt reports she is here because she wants to be released to regular work.   Past Medical History  Diagnosis Date  . ADHD (attention deficit hyperactivity disorder)    Past Surgical History  Procedure Laterality Date  . Throat surgery      patient was 50 years old.    Family History  Problem Relation Age of Onset  . Hypertension Mother   . Heart disease Mother   . Stroke Mother   . Hypertension Daughter    Social History  Substance Use Topics  . Smoking status: Never Smoker   . Smokeless tobacco: None  . Alcohol Use: No   OB History    No data available     Review of Systems  Musculoskeletal: Positive for back pain.  All other systems reviewed and are negative.   Allergies  Review of patient's allergies indicates no known allergies.  Home Medications   Prior to Admission medications   Medication Sig Start Date End Date Taking? Authorizing Provider  cephALEXin (KEFLEX) 500 MG capsule Take 1 capsule (500 mg total) by mouth 4 (four) times daily. 07/17/15   Cheri FowlerKayla Rose, PA-C  cloNIDine (CATAPRES) 0.1 MG tablet Take 1 tablet (0.1 mg total) by mouth 2 (two) times daily. 04/22/15   Quentin Angstlugbemiga E Jegede, MD  cyclobenzaprine (FLEXERIL) 5 MG tablet Take 1 tablet (5 mg total) by mouth 3 (three) times daily as needed for muscle spasms. 10/26/15   Hayden Rasmussenavid  Mabe, NP  HYDROcodone-acetaminophen (NORCO/VICODIN) 5-325 MG tablet Take 1 tablet by mouth every 4 (four) hours as needed. 08/10/15   Hayden Rasmussenavid Mabe, NP  moxifloxacin (VIGAMOX) 0.5 % ophthalmic solution Place 1 drop into both eyes every 2 (two) hours while awake. 1 drop in each eye every two hours while awake for first two days then every 4-8 hours for 5 days 09/20/15   Rolm GalaStevi Barrett, PA-C  naproxen (NAPROSYN) 375 MG tablet Take 1 tablet (375 mg total) by mouth 2 (two) times daily. 10/26/15   Hayden Rasmussenavid Mabe, NP  naproxen sodium (ANAPROX DS) 550 MG tablet Take 1 tablet (550 mg total) by mouth 2 (two) times daily with a meal. 10/11/15   Tharon AquasFrank C Patrick, PA  tobramycin (TOBREX) 0.3 % ophthalmic ointment Place 1 application into both eyes 4 (four) times daily. 08/10/15   Hayden Rasmussenavid Mabe, NP   Meds Ordered and Administered this Visit  Medications - No data to display  BP 140/81 mmHg  Pulse 73  Temp(Src) 98.3 F (36.8 C) (Oral)  Resp 18  SpO2 97% No data found.   Physical Exam  Constitutional: She is oriented to person, place, and time. She appears well-developed and well-nourished.  HENT:  Head: Normocephalic and atraumatic.  Eyes: EOM are normal.  Neck: Normal range of motion.  Cardiovascular: Normal rate.   Pulmonary/Chest: Effort normal.  Abdominal: She exhibits no distension.  Musculoskeletal: Normal range of motion.  nontender ls spine from   Neurological: She is alert and oriented to person, place, and time.  Skin: Skin is warm.  Psychiatric: She has a normal mood and affect.  Nursing note and vitals reviewed.   ED Course  Procedures (including critical care time)  Labs Review Labs Reviewed - No data to display  Imaging Review No results found.   Visual Acuity Review  Right Eye Distance:   Left Eye Distance:   Bilateral Distance:    Right Eye Near:   Left Eye Near:    Bilateral Near:         MDM   1. Episodic low back pain    An After Visit Summary was printed and given  to the patient.    Lonia Skinner Kingston, PA-C 11/02/15 [phone]

## 2015-11-02 NOTE — ED Notes (Signed)
PT reports she was put on new meds for back pain one week ago and has been on one week of light duty at work. PT reports the meds are helping and she would like a note to return to work with no restrictions.

## 2015-11-14 ENCOUNTER — Encounter (HOSPITAL_COMMUNITY): Payer: Self-pay | Admitting: *Deleted

## 2015-11-14 ENCOUNTER — Emergency Department (HOSPITAL_COMMUNITY): Payer: Managed Care, Other (non HMO)

## 2015-11-14 ENCOUNTER — Emergency Department (HOSPITAL_COMMUNITY)
Admission: EM | Admit: 2015-11-14 | Discharge: 2015-11-14 | Disposition: A | Discharge status: Home / Self Care | Payer: Managed Care, Other (non HMO) | Attending: Emergency Medicine | Admitting: Emergency Medicine

## 2015-11-14 DIAGNOSIS — M5442 Lumbago with sciatica, left side: Secondary | ICD-10-CM

## 2015-11-14 DIAGNOSIS — M5441 Lumbago with sciatica, right side: Secondary | ICD-10-CM

## 2015-11-14 DIAGNOSIS — M5440 Lumbago with sciatica, unspecified side: Secondary | ICD-10-CM | POA: Insufficient documentation

## 2015-11-14 MED ORDER — METHOCARBAMOL 500 MG PO TABS
500.0000 mg | ORAL_TABLET | Freq: Four times a day (QID) | ORAL | Status: DC | PRN
Start: 2015-11-14 — End: 2016-02-02

## 2015-11-14 MED ORDER — MELOXICAM 15 MG PO TABS: 15.0000 mg | ORAL_TABLET | Freq: Every day | ORAL | Status: DC

## 2015-11-14 MED ORDER — OXYCODONE-ACETAMINOPHEN 5-325 MG PO TABS: 1.0000 | ORAL_TABLET | ORAL | Status: DC | PRN

## 2015-11-14 NOTE — ED Notes (Signed)
Patient's cell phone and jewelry returned to her prior to discharge.

## 2015-11-14 NOTE — ED Provider Notes (Signed)
CSN: [id number]     Arrival date & time 11/14/15  [id number] History  By signing my name below, I, Brandi Collins, attest that this documentation has been prepared under the direction and in the presence of [name], MD.  Electronically Signed: Rosario AdieWilliam Andrew Collins, ED Scribe. 11/14/2015. 3:26 AM.   Chief Complaint  Patient presents with  . Back Pain   The history is provided by the patient. No language interpreter was used.   HPI Comments: Brandi Collins is a 50 y.o. female who presents to the Emergency Department complaining of gradual onset, gradually worsening, constant, throbbing, 10/10, right-sided lower back pain onset ~8 hours PTA. She notes that her pain radiates into her bilateral knees. She has associated numbness to the areas of pain. Pt has [facility] in the ED [facility] last month for episodes of similar pain. She has been prescribed Flexeril and Naproxen each time, and states that none of her prescriptions have alleviated her pain at all. She states that her pain is worsened with ambulation, positional changes, and standing or laying too long. She denies a hx of similar pain prior to the initial onset, and states that she has never experienced pain like this before. She notes that the initial onset of her pain began when she started her new job 1 month ago. Pt denies tingling, or bowel or bladder incontinence.  Past Medical History  Diagnosis Date  . ADHD (attention deficit hyperactivity disorder)    Past Surgical History  Procedure Laterality Date  . Throat surgery      patient was 50 years old.    Family History  Problem Relation Age of Onset  . Hypertension Mother   . Heart disease Mother   . Stroke Mother   . Hypertension Daughter    Social History  Substance Use Topics  . Smoking status: Never Smoker   . Smokeless tobacco: None  . Alcohol Use: No   OB History    No data available     Review of Systems  Gastrointestinal:       -bowel  incontinence  Genitourinary:       -urinary incontinence  Musculoskeletal: Positive for back pain.  Neurological: Positive for numbness.       -tingling  All other systems reviewed and are negative.  Allergies  Review of patient's allergies indicates no known allergies.  Home Medications   Prior to Admission medications   Medication Sig Start Date End Date Taking? Authorizing Provider  cephALEXin (KEFLEX) 500 MG capsule Take 1 capsule (500 mg total) by mouth 4 (four) times daily. 07/17/15   Cheri FowlerKayla Rose, PA-C  cloNIDine (CATAPRES) 0.1 MG tablet Take 1 tablet (0.1 mg total) by mouth 2 (two) times daily. 04/22/15   Quentin Angstlugbemiga E Jegede, MD  cyclobenzaprine (FLEXERIL) 5 MG tablet Take 1 tablet (5 mg total) by mouth 3 (three) times daily as needed for muscle spasms. 10/26/15   Hayden Rasmussenavid Mabe, NP  HYDROcodone-acetaminophen (NORCO/VICODIN) 5-325 MG tablet Take 1 tablet by mouth every 4 (four) hours as needed. 08/10/15   Hayden Rasmussenavid Mabe, NP  moxifloxacin (VIGAMOX) 0.5 % ophthalmic solution Place 1 drop into both eyes every 2 (two) hours while awake. 1 drop in each eye every two hours while awake for first two days then every 4-8 hours for 5 days 09/20/15   Rolm GalaStevi Barrett, PA-C  naproxen (NAPROSYN) 375 MG tablet Take 1 tablet (375 mg total) by mouth 2 (two) times daily. 10/26/15  Hayden Rasmussenavid Mabe, NP  naproxen sodium (ANAPROX DS) 550 MG tablet Take 1 tablet (550 mg total) by mouth 2 (two) times daily with a meal. 10/11/15   Tharon AquasFrank C Patrick, PA  tobramycin (TOBREX) 0.3 % ophthalmic ointment Place 1 application into both eyes 4 (four) times daily. 08/10/15   Hayden Rasmussenavid Mabe, NP   BP 147/96 mmHg  Pulse 84  Temp(Src) 98.5 F (36.9 C) (Oral)  Resp 16  Ht 5' 8.5" (1.74 m)  Wt 160 lb (72.576 kg)  BMI 23.97 kg/m2  SpO2 96%   Physical Exam  Constitutional: She is oriented to person, place, and time. She appears well-developed and well-nourished.  HENT:  Head: Normocephalic.  Eyes: Conjunctivae and EOM are normal. Pupils are  equal, round, and reactive to light.  Neck: Normal range of motion. Neck supple. No JVD present.  Cardiovascular: Normal rate, regular rhythm and normal heart sounds.   No murmur heard. Pulmonary/Chest: Effort normal and breath sounds normal. She has no wheezes. She has no rales. She exhibits no tenderness.  Abdominal: Soft. Bowel sounds are normal. She exhibits no distension and no mass. There is no tenderness.  Musculoskeletal: Normal range of motion. She exhibits tenderness. She exhibits no edema.  She has tenderness in her lower thoracic and entire lumbar spine. Pt also has moderate bilateral paralumbar spasms. She exhibits positive straight leg raise bilaterally at 60 degrees.  Lymphadenopathy:    She has no cervical adenopathy.  Neurological: She is alert and oriented to person, place, and time. No cranial nerve deficit. She exhibits normal muscle tone. Coordination normal.  Skin: Skin is warm and dry. No rash noted.  Psychiatric: She has a normal mood and affect. Her behavior is normal. Judgment and thought content normal.  Nursing note and vitals reviewed.  ED Course  Procedures (including critical care time) DIAGNOSTIC STUDIES: Oxygen Saturation is 96% on RA, normal by my interpretation.   COORDINATION OF CARE: 3:17 AM-Discussed next steps with pt including MRI lumbar spine. Pt verbalized understanding and is agreeable with the plan.   Imaging Review Mr Lumbar Spine Wo Contrast  11/14/2015  CLINICAL DATA:  Gradually worsening 10/10 RIGHT lower back pain for 8 hours, pain radiates to bilateral knees. EXAM: MRI LUMBAR SPINE WITHOUT CONTRAST TECHNIQUE: Multiplanar, multisequence MR imaging of the lumbar spine was performed. No intravenous contrast was administered. COMPARISON:  None. FINDINGS: Mild motion degraded examination. OSSEOUS STRUCTURES: Lumbar vertebral bodies are intact and aligned with maintenance of lumbar lordosis. Using the reference level of the last well-formed  intervertebral disc as L5-S1, moderate L5-S1 disc height loss with decreased T2 signal within the disc compatible with mild desiccation. Mild chronic discogenic endplate changes at L5-S1. No bone marrow signal abnormality to suggest acute osseous process. SPINAL CORD: Conus medullaris terminates at T12-L1 and demonstrates normal morphology and signal characteristics. Cauda equina is normal. SOFT TISSUES: Included prevertebral and paraspinal soft tissues are normal. LEVEL BY LEVEL EVALUATION: L1-2 and L2-3: No disc bulge, canal stenosis nor neural foraminal narrowing. L3-4: Annular bulging. Mild facet arthropathy and ligamentum flavum redundancy without canal stenosis. Minimal LEFT neural foraminal narrowing. L4-5: No disc bulge. Mild facet arthropathy and ligamentum flavum redundancy without canal stenosis. L5-S1: Small to moderate broad-based LEFT central disc protrusion encroaches upon the traversing LEFT greater than RIGHT S1 nerves. Mild facet arthropathy with LEFT facet sub cm synovial cyst extending into the paraspinal soft tissues. No canal stenosis. Mild LEFT neural foraminal narrowing. IMPRESSION: Mild motion degraded examination. LEFT central small to moderate L5-S1  disc protrusion encroaches upon the traversing S1 nerves and results in mild LEFT L5-S1 neural foraminal narrowing. No acute fracture or malalignment. Electronically Signed   By: Awilda Metro M.D.   On: 11/14/2015 05:30    I have personally reviewed and evaluated these images as part of my medical decision-making.   MDM   Final diagnoses:  Bilateral low back pain with sciatica, sciatica laterality unspecified    Low back pain with sciatica. Old records are reviewed, and she has been seen twice for similar complaints. She has failed to rounds of naproxen with cyclobenzaprine. Given length of time symptoms of been present and failure of conservative management, she is sent for MRI which does show a bulging disc encroaching on the  left-sided S1 nerve root. However, this seems unlikely to be the cause of her symptoms. She is discharged with prescription for meloxicam, methocarbamol, and oxycodone-acetaminophen. She is referred back to her PCP. She may benefit from a course of physical therapy.  I personally performed the services described in this documentation, which was scribed in my presence. The recorded information has been reviewed and is accurate.      Dione Booze, MD 11/14/15 [phone]

## 2015-11-14 NOTE — ED Notes (Signed)
Pt c/o lower back pain since she started her job six months ago. Pt not getting any relief from OTC medication. No recent injury.

## 2015-11-14 NOTE — ED Notes (Signed)
Prior to discharge, patient reports that her phone and jewelry that she went to mri with is not in her room now. John in MRI contacted and stated that patient's belongings were found in MRI, Jonny RuizJohn advised he would bring them to the patient at this point

## 2015-11-14 NOTE — Discharge Instructions (Signed)
Your MRI scan did show a bulging disc on the left side. This is probably not related to year pain. Take the medications as prescribed. Do not take ibuprofen or naproxen since they are similar to the meloxicam that you're being prescribed. You may take the methocarbamol as often as 4 times a day as needed for muscle spasms. You may also use local measures like ice and heat. Follow-up with your doctor this week. You may also benefit from a course of physical therapy.  Sciatica Sciatica is pain, weakness, numbness, or tingling along the path of the sciatic nerve. The nerve starts in the lower back and runs down the back of each leg. The nerve controls the muscles in the lower leg and in the back of the knee, while also providing sensation to the back of the thigh, lower leg, and the sole of your foot. Sciatica is a symptom of another medical condition. For instance, nerve damage or certain conditions, such as a herniated disk or bone spur on the spine, pinch or put pressure on the sciatic nerve. This causes the pain, weakness, or other sensations normally associated with sciatica. Generally, sciatica only affects one side of the body. CAUSES  1. Herniated or slipped disc. 2. Degenerative disk disease. 3. A pain disorder involving the narrow muscle in the buttocks (piriformis syndrome). 4. Pelvic injury or fracture. 5. Pregnancy. 6. Tumor (rare). SYMPTOMS  Symptoms can vary from mild to very severe. The symptoms usually travel from the low back to the buttocks and down the back of the leg. Symptoms can include: 1. Mild tingling or dull aches in the lower back, leg, or hip. 2. Numbness in the back of the calf or sole of the foot. 3. Burning sensations in the lower back, leg, or hip. 4. Sharp pains in the lower back, leg, or hip. 5. Leg weakness. 6. Severe back pain inhibiting movement. These symptoms may get worse with coughing, sneezing, laughing, or prolonged sitting or standing. Also, being  overweight may worsen symptoms. DIAGNOSIS  Your caregiver will perform a physical exam to look for common symptoms of sciatica. He or she may ask you to do certain movements or activities that would trigger sciatic nerve pain. Other tests may be performed to find the cause of the sciatica. These may include: 1. Blood tests. 2. X-rays. 3. Imaging tests, such as an MRI or CT scan. TREATMENT  Treatment is directed at the cause of the sciatic pain. Sometimes, treatment is not necessary and the pain and discomfort goes away on its own. If treatment is needed, your caregiver may suggest: 1. Over-the-counter medicines to relieve pain. 2. Prescription medicines, such as anti-inflammatory medicine, muscle relaxants, or narcotics. 3. Applying heat or ice to the painful area. 4. Steroid injections to lessen pain, irritation, and inflammation around the nerve. 5. Reducing activity during periods of pain. 6. Exercising and stretching to strengthen your abdomen and improve flexibility of your spine. Your caregiver may suggest losing weight if the extra weight makes the back pain worse. 7. Physical therapy. 8. Surgery to eliminate what is pressing or pinching the nerve, such as a bone spur or part of a herniated disk. HOME CARE INSTRUCTIONS  1. Only take over-the-counter or prescription medicines for pain or discomfort as directed by your caregiver. 2. Apply ice to the affected area for 20 minutes, 3-4 times a day for the first 48-72 hours. Then try heat in the same way. 3. Exercise, stretch, or perform your usual activities if these  do not aggravate your pain. 4. Attend physical therapy sessions as directed by your caregiver. 5. Keep all follow-up appointments as directed by your caregiver. 6. Do not wear high heels or shoes that do not provide proper support. 7. Check your mattress to see if it is too soft. A firm mattress may lessen your pain and discomfort. SEEK IMMEDIATE MEDICAL CARE IF:  1. You lose  control of your bowel or bladder (incontinence). 2. You have increasing weakness in the lower back, pelvis, buttocks, or legs. 3. You have redness or swelling of your back. 4. You have a burning sensation when you urinate. 5. You have pain that gets worse when you lie down or awakens you at night. 6. Your pain is worse than you have experienced in the past. 7. Your pain is lasting longer than 4 weeks. 8. You are suddenly losing weight without reason. MAKE SURE YOU: 1. Understand these instructions. 2. Will watch your condition. 3. Will get help right away if you are not doing well or get worse.   This information is not intended to replace advice given to you by your health care provider. Make sure you discuss any questions you have with your health care provider.   Document Released: 05/02/2001 Document Revised: 01/27/2015 Document Reviewed: 09/17/2011 Elsevier Interactive Patient Education 2016 [employer]   Back Exercises The following exercises strengthen the muscles that help to support the back. They also help to keep the lower back flexible. Doing these exercises can help to prevent back pain or lessen existing pain. If you have back pain or discomfort, try doing these exercises 2-3 times each day or as told by your health care provider. When the pain goes away, do them once each day, but increase the number of times that you repeat the steps for each exercise (do more repetitions). If you do not have back pain or discomfort, do these exercises once each day or as told by your health care provider. EXERCISES Single Knee to Chest Repeat these steps 3-5 times for each leg: 7. Lie on your back on a firm bed or the floor with your legs extended. 8. Bring one knee to your chest. Your other leg should stay extended and in contact with the floor. 9. Hold your knee in place by grabbing your knee or thigh. 10. Pull on your knee until you feel a gentle stretch in your lower  back. 11. Hold the stretch for 10-30 seconds. 12. Slowly release and straighten your leg. Pelvic Tilt Repeat these steps 5-10 times: 7. Lie on your back on a firm bed or the floor with your legs extended. 8. Bend your knees so they are pointing toward the ceiling and your feet are flat on the floor. 9. Tighten your lower abdominal muscles to press your lower back against the floor. This motion will tilt your pelvis so your tailbone points up toward the ceiling instead of pointing to your feet or the floor. 10. With gentle tension and even breathing, hold this position for 5-10 seconds. Cat-Cow Repeat these steps until your lower back becomes more flexible: 4. Get into a hands-and-knees position on a firm surface. Keep your hands under your shoulders, and keep your knees under your hips. You may place padding under your knees for comfort. 5. Let your head hang down, and point your tailbone toward the floor so your lower back becomes rounded like the back of a cat. 6. Hold this position for 5 seconds. 7. Slowly lift your  head and point your tailbone up toward the ceiling so your back forms a sagging arch like the back of a cow. 8. Hold this position for 5 seconds. Press-Ups Repeat these steps 5-10 times: 9. Lie on your abdomen (face-down) on the floor. 10. Place your palms near your head, about shoulder-width apart. 11. While you keep your back as relaxed as possible and keep your hips on the floor, slowly straighten your arms to raise the top half of your body and lift your shoulders. Do not use your back muscles to raise your upper torso. You may adjust the placement of your hands to make yourself more comfortable. 12. Hold this position for 5 seconds while you keep your back relaxed. 13. Slowly return to lying flat on the floor. Bridges Repeat these steps 10 times: 8. Lie on your back on a firm surface. 9. Bend your knees so they are pointing toward the ceiling and your feet are flat on  the floor. 10. Tighten your buttocks muscles and lift your buttocks off of the floor until your waist is at almost the same height as your knees. You should feel the muscles working in your buttocks and the back of your thighs. If you do not feel these muscles, slide your feet 1-2 inches farther away from your buttocks. 11. Hold this position for 3-5 seconds. 12. Slowly lower your hips to the starting position, and allow your buttocks muscles to relax completely. If this exercise is too easy, try doing it with your arms crossed over your chest. Abdominal Crunches Repeat these steps 5-10 times: 9. Lie on your back on a firm bed or the floor with your legs extended. 10. Bend your knees so they are pointing toward the ceiling and your feet are flat on the floor. 11. Cross your arms over your chest. 12. Tip your chin slightly toward your chest without bending your neck. 13. Tighten your abdominal muscles and slowly raise your trunk (torso) high enough to lift your shoulder blades a tiny bit off of the floor. Avoid raising your torso higher than that, because it can put too much stress on your low back and it does not help to strengthen your abdominal muscles. 14. Slowly return to your starting position. Back Lifts Repeat these steps 5-10 times: 4. Lie on your abdomen (face-down) with your arms at your sides, and rest your forehead on the floor. 5. Tighten the muscles in your legs and your buttocks. 6. Slowly lift your chest off of the floor while you keep your hips pressed to the floor. Keep the back of your head in line with the curve in your back. Your eyes should be looking at the floor. 7. Hold this position for 3-5 seconds. 8. Slowly return to your starting position. SEEK MEDICAL CARE IF:  Your back pain or discomfort gets much worse when you do an exercise.  Your back pain or discomfort does not lessen within 2 hours after you exercise. If you have any of these problems, stop doing these  exercises right away. Do not do them again unless your health care provider says that you can. SEEK IMMEDIATE MEDICAL CARE IF:  You develop sudden, severe back pain. If this happens, stop doing the exercises right away. Do not do them again unless your health care provider says that you can.   This information is not intended to replace advice given to you by your health care provider. Make sure you discuss any questions you have with your  health care provider.   Document Released: 06/15/2004 Document Revised: 01/27/2015 Document Reviewed: 07/02/2014 Elsevier Interactive Patient Education 2016 [employer]  Meloxicam tablets What is this medicine? MELOXICAM (mel OX i cam) is a non-steroidal anti-inflammatory drug (NSAID). It is used to reduce swelling and to treat pain. It may be used for osteoarthritis, rheumatoid arthritis, or juvenile rheumatoid arthritis. This medicine may be used for other purposes; ask your health care provider or pharmacist if you have questions. What should I tell my health care provider before I take this medicine? They need to know if you have any of these conditions: -bleeding disorders -cigarette smoker -coronary artery bypass graft (CABG) surgery within the past 2 weeks -drink more than 3 alcohol-containing drinks per day -heart disease -high blood pressure -history of stomach bleeding -kidney disease -liver disease -lung or breathing disease, like asthma -stomach or intestine problems -an unusual or allergic reaction to meloxicam, aspirin, other NSAIDs, other medicines, foods, dyes, or preservatives -pregnant or trying to get pregnant -breast-feeding How should I use this medicine? Take this medicine by mouth with a full glass of water. Follow the directions on the prescription label. You can take it with or without food. If it upsets your stomach, take it with food. Take your medicine at regular intervals. Do not take it more often than directed. Do  not stop taking except on your doctor's advice. A special MedGuide will be given to you by the pharmacist with each prescription and refill. Be sure to read this information carefully each time. Talk to your pediatrician regarding the use of this medicine in children. While this drug may be prescribed for selected conditions, precautions do apply. Patients over 79 years old may have a stronger reaction and need a smaller dose. Overdosage: If you think you have taken too much of this medicine contact a poison control center or emergency room at once. NOTE: This medicine is only for you. Do not share this medicine with others. What if I miss a dose? If you miss a dose, take it as soon as you can. If it is almost time for your next dose, take only that dose. Do not take double or extra doses. What may interact with this medicine? Do not take this medicine with any of the following medications: -cidofovir -ketorolac This medicine may also interact with the following medications: -aspirin and aspirin-like medicines -certain medicines for blood pressure, heart disease, irregular heart beat -certain medicines for depression, anxiety, or psychotic disturbances -certain medicines that treat or prevent blood clots like warfarin, enoxaparin, dalteparin, apixaban, dabigatran, rivaroxaban -cyclosporine -digoxin -diuretics -methotrexate -other NSAIDs, medicines for pain and inflammation, like ibuprofen and naproxen -pemetrexed This list may not describe all possible interactions. Give your health care provider a list of all the medicines, herbs, non-prescription drugs, or dietary supplements you use. Also tell them if you smoke, drink alcohol, or use illegal drugs. Some items may interact with your medicine. What should I watch for while using this medicine? Tell your doctor or healthcare professional if your symptoms do not start to get better or if they get worse. Do not take other medicines that  contain aspirin, ibuprofen, or naproxen with this medicine. Side effects such as stomach upset, nausea, or ulcers may be more likely to occur. Many medicines available without a prescription should not be taken with this medicine. This medicine can cause ulcers and bleeding in the stomach and intestines at any time during treatment. This can happen with no warning and may  cause death. There is increased risk with taking this medicine for a long time. Smoking, drinking alcohol, older age, and poor health can also increase risks. Call your doctor right away if you have stomach pain or blood in your vomit or stool. This medicine does not prevent heart attack or stroke. In fact, this medicine may increase the chance of a heart attack or stroke. The chance may increase with longer use of this medicine and in people who have heart disease. If you take aspirin to prevent heart attack or stroke, talk with your doctor or health care professional. What side effects may I notice from receiving this medicine? Side effects that you should report to your doctor or health care professional as soon as possible: -allergic reactions like skin rash, itching or hives, swelling of the face, lips, or tongue -nausea, vomiting -signs and symptoms of a blood clot such as breathing problems; changes in vision; chest pain; severe, sudden headache; pain, swelling, warmth in the leg; trouble speaking; sudden numbness or weakness of the face, arm, or leg -signs and symptoms of bleeding such as bloody or black, tarry stools; red or dark-brown urine; spitting up blood or brown material that looks like coffee grounds; red spots on the skin; unusual bruising or bleeding from the eye, gums, or nose -signs and symptoms of liver injury like dark yellow or brown urine; general ill feeling or flu-like symptoms; light-colored stools; loss of appetite; nausea; right upper belly pain; unusually weak or tired; yellowing of the eyes or skin -signs  and symptoms of stroke like changes in vision; confusion; trouble speaking or understanding; severe headaches; sudden numbness or weakness of the face, arm, or leg; trouble walking; dizziness; loss of balance or coordination Side effects that usually do not require medical attention (report these to your doctor or health care professional if they continue or are bothersome): -constipation -diarrhea -gas This list may not describe all possible side effects. Call your doctor for medical advice about side effects. You may report side effects to FDA at 1-800-FDA-1088. Where should I keep my medicine? Keep out of the reach of children. Store at room temperature between 15 and 30 degrees C (59 and 86 degrees F). Throw away any unused medicine after the expiration date. NOTE: This sheet is a summary. It may not cover all possible information. If you have questions about this medicine, talk to your doctor, pharmacist, or health care provider.    2016, [employer]. (2014-11-26 13:02:23)  Methocarbamol tablets What is this medicine? METHOCARBAMOL (meth oh KAR ba mole) helps to relieve pain and stiffness in muscles caused by strains, sprains, or other injury to your muscles. This medicine may be used for other purposes; ask your health care provider or pharmacist if you have questions. What should I tell my health care provider before I take this medicine? They need to know if you have any of these conditions: -kidney disease -seizures -an unusual or allergic reaction to methocarbamol, other medicines, foods, dyes, or preservatives -pregnant or trying to get pregnant -breast-feeding How should I use this medicine? Take this medicine by mouth with a full glass of water. Follow the directions on the prescription label. Take your medicine at regular intervals. Do not take your medicine more often than directed. Talk to your pediatrician regarding the use of this medicine in children. Special  care may be needed. Overdosage: If you think you have taken too much of this medicine contact a poison control center or emergency room at  once. NOTE: This medicine is only for you. Do not share this medicine with others. What if I miss a dose? If you miss a dose, take it as soon as you can. If it is almost time for your next dose, take only the next dose. Do not take double or extra doses. What may interact with this medicine? -alcohol or medicines that contain alcohol -cholinesterase inhibitors like neostigmine, ambenonium, and pyridostigmine bromide -other medicines that cause drowsiness This list may not describe all possible interactions. Give your health care provider a list of all the medicines, herbs, non-prescription drugs, or dietary supplements you use. Also tell them if you smoke, drink alcohol, or use illegal drugs. Some items may interact with your medicine. What should I watch for while using this medicine? You may get drowsy or dizzy. Do not drive, use machinery, or do anything that needs mental alertness until you know how this medicine affects you. Do not stand or sit up quickly, especially if you are an older patient. This reduces the risk of dizzy or fainting spells. Alcohol may interfere with the effect of this medicine. Avoid alcoholic drinks. What side effects may I notice from receiving this medicine? Side effects that you should report to your doctor or health care professional as soon as possible: -allergic reactions like skin rash, itching or hives, swelling of the face, lips, or tongue -blurred vision or changes in vision -confusion -fainting spells -fever -nausea or vomiting -seizures Side effects that usually do not require medical attention (report to your doctor or health care professional if they continue or are bothersome): -dizziness -drowsiness -headache -metallic taste This list may not describe all possible side effects. Call your doctor for medical  advice about side effects. You may report side effects to FDA at 1-800-FDA-1088. Where should I keep my medicine? Keep out of the reach of children. Store at room temperature between 20 and 25 degrees C (68 and 77 degrees F). Keep container tightly closed. Throw away any unused medicine after the expiration date. NOTE: This sheet is a summary. It may not cover all possible information. If you have questions about this medicine, talk to your doctor, pharmacist, or health care provider.    2016, [employer]. (2007-11-25 16:02:03)  Acetaminophen; Oxycodone tablets What is this medicine? ACETAMINOPHEN; OXYCODONE (a set a MEE noe fen; ox i KOE done) is a pain reliever. It is used to treat moderate to severe pain. This medicine may be used for other purposes; ask your health care provider or pharmacist if you have questions. What should I tell my health care provider before I take this medicine? They need to know if you have any of these conditions: -brain tumor -Crohn's disease, inflammatory bowel disease, or ulcerative colitis -drug abuse or addiction -head injury -heart or circulation problems -if you often drink alcohol -kidney disease or problems going to the bathroom -liver disease -lung disease, asthma, or breathing problems -an unusual or allergic reaction to acetaminophen, oxycodone, other opioid analgesics, other medicines, foods, dyes, or preservatives -pregnant or trying to get pregnant -breast-feeding How should I use this medicine? Take this medicine by mouth with a full glass of water. Follow the directions on the prescription label. You can take it with or without food. If it upsets your stomach, take it with food. Take your medicine at regular intervals. Do not take it more often than directed. Talk to your pediatrician regarding the use of this medicine in children. Special care may be needed. Patients over  89 years old may have a stronger reaction and need a  smaller dose. Overdosage: If you think you have taken too much of this medicine contact a poison control center or emergency room at once. NOTE: This medicine is only for you. Do not share this medicine with others. What if I miss a dose? If you miss a dose, take it as soon as you can. If it is almost time for your next dose, take only that dose. Do not take double or extra doses. What may interact with this medicine? -alcohol -antihistamines -barbiturates like amobarbital, butalbital, butabarbital, methohexital, pentobarbital, phenobarbital, thiopental, and secobarbital -benztropine -drugs for bladder problems like solifenacin, trospium, oxybutynin, tolterodine, hyoscyamine, and methscopolamine -drugs for breathing problems like ipratropium and tiotropium -drugs for certain stomach or intestine problems like propantheline, homatropine methylbromide, glycopyrrolate, atropine, belladonna, and dicyclomine -general anesthetics like etomidate, ketamine, nitrous oxide, propofol, desflurane, enflurane, halothane, isoflurane, and sevoflurane -medicines for depression, anxiety, or psychotic disturbances -medicines for sleep -muscle relaxants -naltrexone -narcotic medicines (opiates) for pain -phenothiazines like perphenazine, thioridazine, chlorpromazine, mesoridazine, fluphenazine, prochlorperazine, promazine, and trifluoperazine -scopolamine -tramadol -trihexyphenidyl This list may not describe all possible interactions. Give your health care provider a list of all the medicines, herbs, non-prescription drugs, or dietary supplements you use. Also tell them if you smoke, drink alcohol, or use illegal drugs. Some items may interact with your medicine. What should I watch for while using this medicine? Tell your doctor or health care professional if your pain does not go away, if it gets worse, or if you have new or a different type of pain. You may develop tolerance to the medicine. Tolerance means  that you will need a higher dose of the medication for pain relief. Tolerance is normal and is expected if you take this medicine for a long time. Do not suddenly stop taking your medicine because you may develop a severe reaction. Your body becomes used to the medicine. This does NOT mean you are addicted. Addiction is a behavior related to getting and using a drug for a non-medical reason. If you have pain, you have a medical reason to take pain medicine. Your doctor will tell you how much medicine to take. If your doctor wants you to stop the medicine, the dose will be slowly lowered over time to avoid any side effects. You may get drowsy or dizzy. Do not drive, use machinery, or do anything that needs mental alertness until you know how this medicine affects you. Do not stand or sit up quickly, especially if you are an older patient. This reduces the risk of dizzy or fainting spells. Alcohol may interfere with the effect of this medicine. Avoid alcoholic drinks. There are different types of narcotic medicines (opiates) for pain. If you take more than one type at the same time, you may have more side effects. Give your health care provider a list of all medicines you use. Your doctor will tell you how much medicine to take. Do not take more medicine than directed. Call emergency for help if you have problems breathing. The medicine will cause constipation. Try to have a bowel movement at least every 2 to 3 days. If you do not have a bowel movement for 3 days, call your doctor or health care professional. Do not take Tylenol (acetaminophen) or medicines that have acetaminophen with this medicine. Too much acetaminophen can be very dangerous. Many nonprescription medicines contain acetaminophen. Always read the labels carefully to avoid taking more acetaminophen. What side effects  may I notice from receiving this medicine? Side effects that you should report to your doctor or health care professional as soon  as possible: -allergic reactions like skin rash, itching or hives, swelling of the face, lips, or tongue -breathing difficulties, wheezing -confusion -light headedness or fainting spells -severe stomach pain -unusually weak or tired -yellowing of the skin or the whites of the eyes Side effects that usually do not require medical attention (report to your doctor or health care professional if they continue or are bothersome): -dizziness -drowsiness -nausea -vomiting This list may not describe all possible side effects. Call your doctor for medical advice about side effects. You may report side effects to FDA at 1-800-FDA-1088. Where should I keep my medicine? Keep out of the reach of children. This medicine can be abused. Keep your medicine in a safe place to protect it from theft. Do not share this medicine with anyone. Selling or giving away this medicine is dangerous and against the law. This medicine may cause accidental overdose and death if it taken by other adults, children, or pets. Mix any unused medicine with a substance like cat litter or coffee grounds. Then throw the medicine away in a sealed container like a sealed bag or a coffee can with a lid. Do not use the medicine after the expiration date. Store at room temperature between 20 and 25 degrees C (68 and 77 degrees F). NOTE: This sheet is a summary. It may not cover all possible information. If you have questions about this medicine, talk to your doctor, pharmacist, or health care provider.    2016, Elsevier/Gold Standard. (2014-04-08 15:18:46)

## 2015-11-15 ENCOUNTER — Ambulatory Visit: Payer: Self-pay | Attending: Internal Medicine | Admitting: Internal Medicine

## 2015-11-15 ENCOUNTER — Telehealth: Payer: Self-pay | Admitting: *Deleted

## 2015-11-15 ENCOUNTER — Encounter: Payer: Self-pay | Admitting: Internal Medicine

## 2015-11-15 ENCOUNTER — Encounter: Payer: Self-pay | Admitting: Gastroenterology

## 2015-11-15 VITALS — BP 127/78 | HR 60 | Temp 97.9°F | Resp 18 | Ht 68.5 in | Wt 164.6 lb

## 2015-11-15 DIAGNOSIS — F909 Attention-deficit hyperactivity disorder, unspecified type: Secondary | ICD-10-CM | POA: Insufficient documentation

## 2015-11-15 DIAGNOSIS — M5441 Lumbago with sciatica, right side: Secondary | ICD-10-CM | POA: Insufficient documentation

## 2015-11-15 DIAGNOSIS — Z79899 Other long term (current) drug therapy: Secondary | ICD-10-CM | POA: Insufficient documentation

## 2015-11-15 DIAGNOSIS — M5442 Lumbago with sciatica, left side: Secondary | ICD-10-CM

## 2015-11-15 DIAGNOSIS — M5126 Other intervertebral disc displacement, lumbar region: Secondary | ICD-10-CM | POA: Insufficient documentation

## 2015-11-15 DIAGNOSIS — I10 Essential (primary) hypertension: Secondary | ICD-10-CM | POA: Insufficient documentation

## 2015-11-15 DIAGNOSIS — Z1211 Encounter for screening for malignant neoplasm of colon: Secondary | ICD-10-CM

## 2015-11-15 DIAGNOSIS — Z23 Encounter for immunization: Secondary | ICD-10-CM

## 2015-11-15 DIAGNOSIS — Z114 Encounter for screening for human immunodeficiency virus [HIV]: Secondary | ICD-10-CM

## 2015-11-15 LAB — CBC WITH DIFFERENTIAL/PLATELET
BASOS ABS: 0 {cells}/uL (ref 0–200)
BASOS PCT: 0 %
Eosinophils Absolute: 79 cells/uL (ref 15–500)
Eosinophils Relative: 1 %
HEMATOCRIT: 40.9 % (ref 35.0–45.0)
Hemoglobin: 13.5 g/dL (ref 11.7–15.5)
Lymphocytes Relative: 52 %
Lymphs Abs: 4108 cells/uL — ABNORMAL HIGH (ref 850–3900)
MCH: 28.9 pg (ref 27.0–33.0)
MCHC: 33 g/dL (ref 32.0–36.0)
MCV: 87.6 fL (ref 80.0–100.0)
MONO ABS: 711 {cells}/uL (ref 200–950)
MPV: 11.2 fL (ref 7.5–12.5)
Monocytes Relative: 9 %
NEUTROS ABS: 3002 {cells}/uL (ref 1500–7800)
Neutrophils Relative %: 38 %
Platelets: 233 10*3/uL (ref 140–400)
RBC: 4.67 MIL/uL (ref 3.80–5.10)
RDW: 15.5 % — ABNORMAL HIGH (ref 11.0–15.0)
WBC: 7.9 10*3/uL (ref 3.8–10.8)

## 2015-11-15 LAB — BASIC METABOLIC PANEL WITH GFR
BUN: 10 mg/dL (ref 7–25)
CHLORIDE: 109 mmol/L (ref 98–110)
CO2: 24 mmol/L (ref 20–31)
CREATININE: 0.72 mg/dL (ref 0.50–1.05)
Calcium: 9 mg/dL (ref 8.6–10.4)
GFR, Est African American: >89 mL/min (ref >=60)
Glucose, Bld: 44 mg/dL — ABNORMAL LOW (ref 65–99)
Potassium: 4.1 mmol/L (ref 3.5–5.3)
Sodium: 143 mmol/L (ref 135–146)

## 2015-11-15 MED ORDER — TETANUS-DIPHTH-ACELL PERTUSSIS 5-2.5-18.5 LF-MCG/0.5 IM SUSP
0.5000 mL | Freq: Once | INTRAMUSCULAR | Status: AC
  Administered 2015-11-15: 0.5 mL via INTRAMUSCULAR

## 2015-11-15 MED ORDER — CLONIDINE HCL 0.1 MG PO TABS: 0.1000 mg | ORAL_TABLET | Freq: Two times a day (BID) | ORAL | Status: DC

## 2015-11-15 MED ORDER — DICLOFENAC SODIUM 1 % TD GEL: 2.0000 g | Freq: Four times a day (QID) | TRANSDERMAL | Status: DC

## 2015-11-15 MED ORDER — TRAMADOL HCL 50 MG PO TABS: 50.0000 mg | ORAL_TABLET | Freq: Three times a day (TID) | ORAL | Status: DC | PRN

## 2015-11-15 NOTE — Patient Instructions (Signed)
Back Pain, Adult Back pain is very common in adults.The cause of back pain is rarely dangerous and the pain often gets better over time.The cause of your back pain may not be known. Some common causes of back pain include: 1. Strain of the muscles or ligaments supporting the spine. 2. Wear and tear (degeneration) of the spinal disks. 3. Arthritis. 4. Direct injury to the back. For many people, back pain may return. Since back pain is rarely dangerous, most people can learn to manage this condition on their own. HOME CARE INSTRUCTIONS Watch your back pain for any changes. The following actions may help to lessen any discomfort you are feeling: 1. Remain active. It is stressful on your back to sit or stand in one place for long periods of time. Do not sit, drive, or stand in one place for more than 30 minutes at a time. Take short walks on even surfaces as soon as you are able.Try to increase the length of time you walk each day. 2. Exercise regularly as directed by your health care provider. Exercise helps your back heal faster. It also helps avoid future injury by keeping your muscles strong and flexible. 3. Do not stay in bed.Resting more than 1-2 days can delay your recovery. 4. Pay attention to your body when you bend and lift. The most comfortable positions are those that put less stress on your recovering back. Always use proper lifting techniques, including: 1. Bending your knees. 2. Keeping the load close to your body. 3. Avoiding twisting. 5. Find a comfortable position to sleep. Use a firm mattress and lie on your side with your knees slightly bent. If you lie on your back, put a pillow under your knees. 6. Avoid feeling anxious or stressed.Stress increases muscle tension and can worsen back pain.It is important to recognize when you are anxious or stressed and learn ways to manage it, such as with exercise. 7. Take medicines only as directed by your health care provider.  Over-the-counter medicines to reduce pain and inflammation are often the most helpful.Your health care provider may prescribe muscle relaxant drugs.These medicines help dull your pain so you can more quickly return to your normal activities and healthy exercise. 8. Apply ice to the injured area: 1. Put ice in a plastic bag. 2. Place a towel between your skin and the bag. 3. Leave the ice on for 20 minutes, 2-3 times a day for the first 2-3 days. After that, ice and heat may be alternated to reduce pain and spasms. 9. Maintain a healthy weight. Excess weight puts extra stress on your back and makes it difficult to maintain good posture. SEEK MEDICAL CARE IF: 1. You have pain that is not relieved with rest or medicine. 2. You have increasing pain going down into the legs or buttocks. 3. You have pain that does not improve in one week. 4. You have night pain. 5. You lose weight. 6. You have a fever or chills. SEEK IMMEDIATE MEDICAL CARE IF:  1. You develop new bowel or bladder control problems. 2. You have unusual weakness or numbness in your arms or legs. 3. You develop nausea or vomiting. 4. You develop abdominal pain. 5. You feel faint.   This information is not intended to replace advice given to you by your health care provider. Make sure you discuss any questions you have with your health care provider.   Document Released: 05/08/2005 Document Revised: 05/29/2014 Document Reviewed: 09/09/2013 Elsevier Interactive Patient Education 2016 Elsevier  Inc.  - Sciatica Sciatica is pain, weakness, numbness, or tingling along your sciatic nerve. The nerve starts in the lower back and runs down the back of each leg. Nerve damage or certain conditions pinch or put pressure on the sciatic nerve. This causes the pain, weakness, and other discomforts of sciatica. HOME CARE  5. Only take medicine as told by your doctor. 6. Apply ice to the affected area for 20 minutes. Do this 3-4 times a day for  the first 48-72 hours. Then try heat in the same way. 7. Exercise, stretch, or do your usual activities if these do not make your pain worse. 8. Go to physical therapy as told by your doctor. 9. Keep all doctor visits as told. 10. Do not wear high heels or shoes that are not supportive. 11. Get a firm mattress if your mattress is too soft to lessen pain and discomfort. GET HELP RIGHT AWAY IF:  10. You cannot control when you poop (bowel movement) or pee (urinate). 11. You have more weakness in your lower back, lower belly (pelvis), butt (buttocks), or legs. 12. You have redness or puffiness (swelling) of your back. 13. You have a burning feeling when you pee. 14. You have pain that gets worse when you lie down. 15. You have pain that wakes you from your sleep. 16. Your pain is worse than past pain. 17. Your pain lasts longer than 4 weeks. 18. You are suddenly losing weight without reason. MAKE SURE YOU:  7. Understand these instructions. 8. Will watch this condition. 9. Will get help right away if you are not doing well or get worse.   This information is not intended to replace advice given to you by your health care provider. Make sure you discuss any questions you have with your health care provider.   Document Released: 02/15/2008 Document Revised: 01/27/2015 Document Reviewed: 09/17/2011 Elsevier Interactive Patient Education 2016 Elsevier Inc.  - Back Exercises If you have pain in your back, do these exercises 2-3 times each day or as told by your doctor. When the pain goes away, do the exercises once each day, but repeat the steps more times for each exercise (do more repetitions). If you do not have pain in your back, do these exercises once each day or as told by your doctor. EXERCISES Single Knee to Chest Do these steps 3-5 times in a row for each leg: 12. Lie on your back on a firm bed or the floor with your legs stretched out. 13. Bring one knee to your chest. 14. Hold  your knee to your chest by grabbing your knee or thigh. 15. Pull on your knee until you feel a gentle stretch in your lower back. 16. Keep doing the stretch for 10-30 seconds. 17. Slowly let go of your leg and straighten it. Pelvic Tilt Do these steps 5-10 times in a row: 19. Lie on your back on a firm bed or the floor with your legs stretched out. 20. Bend your knees so they point up to the ceiling. Your feet should be flat on the floor. 21. Tighten your lower belly (abdomen) muscles to press your lower back against the floor. This will make your tailbone point up to the ceiling instead of pointing down to your feet or the floor. 22. Stay in this position for 5-10 seconds while you gently tighten your muscles and breathe evenly. Cat-Cow Do these steps until your lower back bends more easily: 10. Get on your hands and  knees on a firm surface. Keep your hands under your shoulders, and keep your knees under your hips. You may put padding under your knees. 11. Let your head hang down, and make your tailbone point down to the floor so your lower back is round like the back of a cat. 12. Stay in this position for 5 seconds. 13. Slowly lift your head and make your tailbone point up to the ceiling so your back hangs low (sags) like the back of a cow. 14. Stay in this position for 5 seconds. Press-Ups Do these steps 5-10 times in a row: 6. Lie on your belly (face-down) on the floor. 7. Place your hands near your head, about shoulder-width apart. 8. While you keep your back relaxed and keep your hips on the floor, slowly straighten your arms to raise the top half of your body and lift your shoulders. Do not use your back muscles. To make yourself more comfortable, you may change where you place your hands. 9. Stay in this position for 5 seconds. 10. Slowly return to lying flat on the floor. Bridges Do these steps 10 times in a row: 1. Lie on your back on a firm surface. 2. Bend your knees so they  point up to the ceiling. Your feet should be flat on the floor. 3. Tighten your butt muscles and lift your butt off of the floor until your waist is almost as high as your knees. If you do not feel the muscles working in your butt and the back of your thighs, slide your feet 1-2 inches farther away from your butt. 4. Stay in this position for 3-5 seconds. 5. Slowly lower your butt to the floor, and let your butt muscles relax. If this exercise is too easy, try doing it with your arms crossed over your chest. Belly Crunches Do these steps 5-10 times in a row: 1. Lie on your back on a firm bed or the floor with your legs stretched out. 2. Bend your knees so they point up to the ceiling. Your feet should be flat on the floor. 3. Cross your arms over your chest. 4. Tip your chin a little bit toward your chest but do not bend your neck. 5. Tighten your belly muscles and slowly raise your chest just enough to lift your shoulder blades a tiny bit off of the floor. 6. Slowly lower your chest and your head to the floor. Back Lifts Do these steps 5-10 times in a row: 1. Lie on your belly (face-down) with your arms at your sides, and rest your forehead on the floor. 2. Tighten the muscles in your legs and your butt. 3. Slowly lift your chest off of the floor while you keep your hips on the floor. Keep the back of your head in line with the curve in your back. Look at the floor while you do this. 4. Stay in this position for 3-5 seconds. 5. Slowly lower your chest and your face to the floor. GET HELP IF:  Your back pain gets a lot worse when you do an exercise.  Your back pain does not lessen 2 hours after you exercise. If you have any of these problems, stop doing the exercises. Do not do them again unless your doctor says it is okay. GET HELP RIGHT AWAY IF:  You have sudden, very bad back pain. If this happens, stop doing the exercises. Do not do them again unless your doctor says it is okay.  This information is not intended to replace advice given to you by your health care provider. Make sure you discuss any questions you have with your health care provider.   Document Released: 06/10/2010 Document Revised: 01/27/2015 Document Reviewed: 07/02/2014 Elsevier Interactive Patient Education [employer]2016 Elsevier Inc.

## 2015-11-15 NOTE — Addendum Note (Signed)
Addended by: Margaretmary LombardLISBON, Hideo Googe K on: 11/15/2015 11:25 AM   Modules accepted: Orders

## 2015-11-15 NOTE — Progress Notes (Signed)
Brandi Collins, is a 50 y.o. female  [id number]  [id number]  DOB - [birth year]  CC:  Chief Complaint  Patient presents with  . Back Pain       HPI: Brandi Pacificawanna Hellenbrand is a 50 y.o. female here today to establish medical care, w/ significant pmhx of htn, last seen in clinic 12/16.  Since than, she had a back injury at work, works as LawyerCNA, helping to assist Pt from falling, ended up hurting her back; she was wearing back belt at time.  Since than, she has been seen in ED 2-3 times, recently prescribed percocet, meloxicam, robaxin.  She only took 1/2 pill of percocet, did not like how it was affecting her, so stopped. Currently trying meloxicam and robaxin, but does not help much. If she moves or bends wrong, gets significant pain on right lower back w/ radiation down bilateral legs, w/ parathesias around knee region now.  Denies loss of urinary or stool incontinence.  Fasting currently. Denies smoking /drinking.  Due to back injury , patient currently home, and getting bored and "depressed". Denies si/hi.  Concerned about her future work options, since CNA help with a lot of lifting, etc.   Patient has No headache, No chest pain, No abdominal pain - No Nausea,  No Cough - SOB.   Review of Systems: Per HPI, o/w all systems reviewed and negative.    No Known Allergies Past Medical History  Diagnosis Date  . ADHD (attention deficit hyperactivity disorder)    Current Outpatient Prescriptions on File Prior to Visit  Medication Sig Dispense Refill  . cloNIDine (CATAPRES) 0.1 MG tablet Take 1 tablet (0.1 mg total) by mouth 2 (two) times daily. 60 tablet 3  . cyclobenzaprine (FLEXERIL) 5 MG tablet Take 1 tablet (5 mg total) by mouth 3 (three) times daily as needed for muscle spasms. 20 tablet 0  . HYDROcodone-acetaminophen (NORCO/VICODIN) 5-325 MG tablet Take 1 tablet by mouth every 4 (four) hours as needed. 8 tablet 0  . meloxicam (MOBIC) 15 MG tablet Take 1 tablet (15 mg total) by  mouth daily. 30 tablet 0  . methocarbamol (ROBAXIN) 500 MG tablet Take 1 tablet (500 mg total) by mouth 4 (four) times daily as needed for muscle spasms. 40 tablet 0  . moxifloxacin (VIGAMOX) 0.5 % ophthalmic solution Place 1 drop into both eyes every 2 (two) hours while awake. 1 drop in each eye every two hours while awake for first two days then every 4-8 hours for 5 days 3 mL 0  . naproxen (NAPROSYN) 375 MG tablet Take 1 tablet (375 mg total) by mouth 2 (two) times daily. 20 tablet 0  . naproxen sodium (ANAPROX DS) 550 MG tablet Take 1 tablet (550 mg total) by mouth 2 (two) times daily with a meal. 30 tablet 0  . oxyCODONE-acetaminophen (PERCOCET) 5-325 MG tablet Take 1 tablet by mouth every 4 (four) hours as needed for moderate pain. 15 tablet 0  . tobramycin (TOBREX) 0.3 % ophthalmic ointment Place 1 application into both eyes 4 (four) times daily. 3.5 g 0  . [DISCONTINUED] hydrochlorothiazide (HYDRODIURIL) 12.5 MG tablet Take 1 tablet (12.5 mg total) by mouth daily. (Patient not taking: Reported on 07/12/2014) 30 tablet 3   No current facility-administered medications on file prior to visit.   Family History  Problem Relation Age of Onset  . Hypertension Mother   . Heart disease Mother   . Stroke Mother   . Hypertension Daughter    Social History  Social History  . Marital Status: Single    Spouse Name: N/A  . Number of Children: N/A  . Years of Education: N/A   Occupational History  . Not on file.   Social History Main Topics  . Smoking status: Never Smoker   . Smokeless tobacco: Not on file  . Alcohol Use: No  . Drug Use: Yes    Special: Marijuana     Comment: last used marijuana 2 days ago  . Sexual Activity: Not on file   Other Topics Concern  . Not on file   Social History Narrative    Objective:   Filed Vitals:   11/15/15 1006  BP: 127/78  Pulse: 60  Temp: 97.9 F (36.6 C)  Resp: 18    Filed Weights   11/15/15 1006  Weight: 164 lb 9.6 oz (74.662  kg)    BP Readings from Last 3 Encounters:  11/15/15 127/78  11/14/15 145/90  11/02/15 140/81    Physical Exam: Constitutional: Patient appears well-developed and well-nourished. No distress. AAOx3, pleasant. Able to move about in room, did some bending for me as well, and motion was not limited due to pain today. HENT: Normocephalic, atraumatic, External right and left ear normal. Oropharynx is clear and moist.  Eyes: Conjunctivae and EOM are normal. PERRL, no scleral icterus. Neck: Normal ROM. Neck supple. No JVD.  CVS: RRR, S1/S2 +, no murmurs, no gallops, no carotid bruit.  Pulmonary: Effort and breath sounds normal, no stridor, rhonchi, wheezes, rales.  Abdominal: Soft. BS +, no distension, tenderness, rebound or guarding.  Musculoskeletal: Normal range of motion. No edema. ttp right lower back/l4-5 region.  +straight leg test bilaterally, w/ pain reproducible in bilateral legs (post/lateral region) w/ decrease sensation noted on lateral aspect of both thighs as well. No saddle parathesias. LE: bilat/ no c/c/e, pulses 2+ bilateral. Neuro: Alert. Normal reflexes 2+ bilat knees, muscle tone coordination wnl. No cranial nerve deficit grossly. Skin: Skin is warm and dry. No rash noted. Not diaphoretic. No erythema. No pallor. Small local skin excoriations left lat breast, w/ mild erythema. Psychiatric: Normal mood and affect. Behavior, judgment, thought content normal.  Lab Results  Component Value Date   WBC 6.3 09/04/2013   HGB 14.9 09/04/2013   HCT 45.2 09/04/2013   MCV 87.6 09/04/2013   PLT 223 09/04/2013   Lab Results  Component Value Date   CREATININE 1.02 04/22/2015   BUN 9 04/22/2015   NA 142 04/22/2015   K 4.6 04/22/2015   CL 105 04/22/2015   CO2 31 04/22/2015    No results found for: HGBA1C Lipid Panel     Component Value Date/Time   CHOL 148 04/22/2015 1152   TRIG 72 04/22/2015 1152   HDL 69 04/22/2015 1152   CHOLHDL 2.1 04/22/2015 1152   VLDL 14  04/22/2015 1152   LDLCALC 65 04/22/2015 1152       Depression screen PHQ 2/9 11/15/2015 04/22/2015 04/28/2013  Decreased Interest 0 3 0  Down, Depressed, Hopeless 0 1 0  PHQ - 2 Score 0 4 0  Altered sleeping - 2 -  Tired, decreased energy - 1 -  Change in appetite - 3 -  Feeling bad or failure about yourself  - 1 -  Trouble concentrating - 1 -  Moving slowly or fidgety/restless - 1 -  Suicidal thoughts - 1 -  PHQ-9 Score - 14 -    Assessment and plan:   1. Essential hypertension, benign - controlled., on clonidine 0.1mg   po bid, renewed. - HgB A1c - BASIC METABOLIC PANEL WITH GFR - CBC with Differential  2. Colon cancer screening - Ambulatory referral to Gastroenterology for colonoscopy  3. Right-sided low back pain with bilateral sciatica, unspecified chronicity - back exercises provided, recd low impact sports such as swimming. - did not tol percocets, added ultram and volterin gel trial, continue mobic/flexeril prn for spasms - Ambulatory referral to Orthopedic Surgery - Ambulatory referral to Physical Therapy - Vitamin D, 25-hydroxy  4. Herniated lumbar intervertebral disc - noted on mri, l5-s1, no worrisome clinical findings today. - ortho eval.  5. Screening for HIV (human immunodeficiency virus) - HIV antibody (with reflex)  6. Need for Tdap vaccination - Tdap (BOOSTRIX) injection 0.5 mL; Inject 0.5 mLs into the muscle once.  7. Small early cellulitis from excoriation, left lat breast region - gave pt packets of triple abx ointment, use bid  8. Health maintenance - up to date MM - needs colonoscopy - ordered - needs pap smear - recd set appt when able w/ me/.  Return in about 4 weeks (around 12/13/2015) for pap smear.  The patient was given clear instructions to go to ER or return to medical center if symptoms don't improve, worsen or new problems develop. The patient verbalized understanding. The patient was told to call to get lab results if they haven't  heard anything in the next week.    This note has been created with Education officer, environmental. Any transcriptional errors are unintentional.   Pete Glatter, MD, MBA/MHA [facility] [place], [place] [phone]   11/15/2015, 10:12 AM

## 2015-11-15 NOTE — Progress Notes (Signed)
Patient is here for Back Pain  Patient complains of back pain being present currently scaled at a 10. Pain is described as throbbing.  Patient has taken medication today and patient has eaten.  Patient tolerated injection well in the left deltoid.

## 2015-11-15 NOTE — Progress Notes (Signed)
Patient refused the completed Depression screening.

## 2015-11-15 NOTE — Telephone Encounter (Signed)
Patient verified DOB Patient is aware of Voltaren Prescription being placed at the front desk for pick-up. No further question at this time.

## 2015-11-16 ENCOUNTER — Other Ambulatory Visit: Payer: Self-pay | Admitting: Internal Medicine

## 2015-11-16 LAB — VITAMIN D 25 HYDROXY (VIT D DEFICIENCY, FRACTURES): Vit D, 25-Hydroxy: 9 ng/mL — ABNORMAL LOW (ref 30–100)

## 2015-11-16 LAB — HIV ANTIBODY (ROUTINE TESTING W REFLEX): HIV 1&2 Ab, 4th Generation: NONREACTIVE

## 2015-11-16 MED ORDER — VITAMIN D (ERGOCALCIFEROL) 1.25 MG (50000 UNIT) PO CAPS
50000.0000 [IU] | ORAL_CAPSULE | ORAL | Status: DC
Start: 2015-11-16 — End: 2016-03-15

## 2015-12-06 ENCOUNTER — Ambulatory Visit: Payer: Self-pay | Attending: Internal Medicine | Admitting: Internal Medicine

## 2015-12-06 ENCOUNTER — Encounter: Payer: Self-pay | Admitting: Internal Medicine

## 2015-12-06 ENCOUNTER — Other Ambulatory Visit: Payer: Self-pay | Admitting: Internal Medicine

## 2015-12-06 VITALS — BP 161/108 | HR 68 | Temp 98.3°F | Wt 158.2 lb

## 2015-12-06 DIAGNOSIS — Z79899 Other long term (current) drug therapy: Secondary | ICD-10-CM | POA: Insufficient documentation

## 2015-12-06 DIAGNOSIS — Z202 Contact with and (suspected) exposure to infections with a predominantly sexual mode of transmission: Secondary | ICD-10-CM | POA: Insufficient documentation

## 2015-12-06 DIAGNOSIS — F909 Attention-deficit hyperactivity disorder, unspecified type: Secondary | ICD-10-CM | POA: Insufficient documentation

## 2015-12-06 DIAGNOSIS — A539 Syphilis, unspecified: Secondary | ICD-10-CM | POA: Insufficient documentation

## 2015-12-06 DIAGNOSIS — I159 Secondary hypertension, unspecified: Secondary | ICD-10-CM | POA: Insufficient documentation

## 2015-12-06 MED ORDER — HYDROCHLOROTHIAZIDE 25 MG PO TABS: 25.0000 mg | ORAL_TABLET | Freq: Every day | ORAL | Status: DC

## 2015-12-06 NOTE — Progress Notes (Signed)
Brandi Collins, is a 50 y.o. female  WGN:[id number]  HQI:[id number]  DOB - 03-05-1966  Chief Complaint  Patient presents with  . Exposure to STD    Dx: Syphilis  12/03/15 need Rx antibiotics        Subjective:   Brandi Collins is a 50 y.o. female here today for a follow up visit for possible syphilis. Pt tried to donate plasma last week, was told that had reactive Syphillis screening and told to go to health Dept or PCP for further wkup.  Per pt, hx of treated syphilis years ago. asx now. In long term relationship w/ gf, denies other sexual partners.  Of note, this test result has made her very anxious, bumping up her bp.   Patient has No headache, No chest pain, No abdominal pain - No Nausea, No new weakness tingling or numbness, No Cough - SOB.  No problems updated.  ALLERGIES: No Known Allergies  PAST MEDICAL HISTORY: Past Medical History  Diagnosis Date  . ADHD (attention deficit hyperactivity disorder)     MEDICATIONS AT HOME: Prior to Admission medications   Medication Sig Start Date End Date Taking? Authorizing Provider  cloNIDine (CATAPRES) 0.1 MG tablet Take 1 tablet (0.1 mg total) by mouth 2 (two) times daily. 11/15/15  Yes Pete Glatter, MD  cyclobenzaprine (FLEXERIL) 5 MG tablet Take 1 tablet (5 mg total) by mouth 3 (three) times daily as needed for muscle spasms. 10/26/15  Yes Hayden Rasmussen, NP  diclofenac sodium (VOLTAREN) 1 % GEL Apply 2 g topically 4 (four) times daily. 11/15/15  Yes Pete Glatter, MD  HYDROcodone-acetaminophen (NORCO/VICODIN) 5-325 MG tablet Take 1 tablet by mouth every 4 (four) hours as needed. 08/10/15  Yes Hayden Rasmussen, NP  meloxicam (MOBIC) 15 MG tablet Take 1 tablet (15 mg total) by mouth daily. 11/14/15  Yes Dione Booze, MD  methocarbamol (ROBAXIN) 500 MG tablet Take 1 tablet (500 mg total) by mouth 4 (four) times daily as needed for muscle spasms. 11/14/15  Yes Dione Booze, MD  naproxen (NAPROSYN) 375 MG tablet Take 1 tablet (375 mg  total) by mouth 2 (two) times daily. 10/26/15  Yes Hayden Rasmussen, NP  oxyCODONE-acetaminophen (PERCOCET) 5-325 MG tablet Take 1 tablet by mouth every 4 (four) hours as needed for moderate pain. 11/14/15  Yes Dione Booze, MD  traMADol (ULTRAM) 50 MG tablet Take 1 tablet (50 mg total) by mouth every 8 (eight) hours as needed. 11/15/15  Yes Pete Glatter, MD  Vitamin D, Ergocalciferol, (DRISDOL) 50000 units CAPS capsule Take 1 capsule (50,000 Units total) by mouth every 7 (seven) days. 11/16/15  Yes Pete Glatter, MD  hydrochlorothiazide (HYDRODIURIL) 25 MG tablet Take 1 tablet (25 mg total) by mouth daily. 12/06/15   Pete Glatter, MD  moxifloxacin (VIGAMOX) 0.5 % ophthalmic solution Place 1 drop into both eyes every 2 (two) hours while awake. 1 drop in each eye every two hours while awake for first two days then every 4-8 hours for 5 days Patient not taking: Reported on 12/06/2015 09/20/15   Rolm Gala Barrett, PA-C  naproxen sodium (ANAPROX DS) 550 MG tablet Take 1 tablet (550 mg total) by mouth 2 (two) times daily with a meal. 10/11/15   Tharon Aquas, PA  tobramycin (TOBREX) 0.3 % ophthalmic ointment Place 1 application into both eyes 4 (four) times daily. Patient not taking: Reported on 12/06/2015 08/10/15   Hayden Rasmussen, NP     Objective:   Filed Vitals:   12/06/15  1216 12/06/15 1233  BP: 156/105 161/108  Pulse: 64 68  Temp: 98.3 F (36.8 C)   TempSrc: Oral   Weight: 158 lb 3.2 oz (71.759 kg)     Exam General appearance : Awake, alert, not in any distress. Speech Clear. Not toxic looking, anxious about test results HEENT: Atraumatic and Normocephalic, pupils equally reactive to light. Chest:Good air entry bilaterally, no added sounds. CVS: S1 S2 regular, no murmurs/gallups or rubs. Abdomen: Bowel sounds active, nttp Neurology: Awake alert, and oriented X 3, CN II-XII grossly intact, Non focal, anxious. Skin:No Rash  Data Review No results found for: HGBA1C  [facility]  12/06/2015 11/15/2015 04/22/2015 04/28/2013  Decreased Interest 0 0 3 0  Down, Depressed, Hopeless 0 0 1 0  PHQ - 2 Score 0 0 4 0  Altered sleeping - - 2 -  Tired, decreased energy - - 1 -  Change in appetite - - 3 -  Feeling bad or failure about yourself  - - 1 -  Trouble concentrating - - 1 -  Moving slowly or fidgety/restless - - 1 -  Suicidal thoughts - - 1 -  PHQ-9 Score - - 14 -    Outside testing/ 12/03/15 STS serodia TP/PA reactive.  Assessment & Plan   1. STD exposure Possible, outside labs +syphilis exposure, may be from her hx of syphilis (treated per pt),  - GC/chlamydia/trichomonous probe amp, urine cytology - RPR - VDRL, Serum - Syphilis antibody, IgM, ELISA  - referred her to Health Dept for further testing/treatment if + IGm.  2. Syphilis H/o infection in past per pt, treated.  3. Secondary hypertension, unspecified, very anxious about test results. - low salt diet recd - hctz 25 qday for now      Patient have been counseled extensively about nutrition and exercise  Return in about 4 weeks (around 01/03/2016) for labs. /bp check  The patient was given clear instructions to go to ER or return to medical center if symptoms don't improve, worsen or new problems develop. The patient verbalized understanding. The patient was told to call to get lab results if they haven't heard anything in the next week.   This note has been created with Education officer, environmentalDragon speech recognition software and smart phrase technology. Any transcriptional errors are unintentional.   Pete Glatterawn T Azzie Thiem, MD, MBA/MHA [facility] [place], [place] [phone]   12/06/2015, 12:55 PM

## 2015-12-06 NOTE — Patient Instructions (Signed)
Low-Sodium Eating Plan °Sodium raises blood pressure and causes water to be held in the body. Getting less sodium from food will help lower your blood pressure, reduce any swelling, and protect your heart, liver, and kidneys. We get sodium by adding salt (sodium chloride) to food. Most of our sodium comes from canned, boxed, and frozen foods. Restaurant foods, fast foods, and pizza are also very high in sodium. Even if you take medicine to lower your blood pressure or to reduce fluid in your body, getting less sodium from your food is important. °WHAT IS MY PLAN? °Most people should limit their sodium intake to 2,300 mg a day. Your health care provider recommends that you limit your sodium intake to __________ a day.  °WHAT DO I NEED TO KNOW ABOUT THIS EATING PLAN? °For the low-sodium eating plan, you will follow these general guidelines: °· Choose foods with a % Daily Value for sodium of less than 5% (as listed on the food label).   °· Use salt-free seasonings or herbs instead of table salt or sea salt.   °· Check with your health care provider or pharmacist before using salt substitutes.   °· Eat fresh foods. °· Eat more vegetables and fruits. °· Limit canned vegetables. If you do use them, rinse them well to decrease the sodium.   °· Limit cheese to 1 oz (28 g) per day.    °· Eat lower-sodium products, often labeled as "lower sodium" or "no salt added." °· Avoid foods that contain monosodium glutamate (MSG). MSG is sometimes added to Chinese food and some canned foods.   °· Check food labels (Nutrition Facts labels) on foods to learn how much sodium is in one serving. °· Eat more home-cooked food and less restaurant, buffet, and fast food.  °· When eating at a restaurant, ask that your food be prepared with less salt, or no salt if possible.   °HOW DO I READ FOOD LABELS FOR SODIUM INFORMATION? °The Nutrition Facts label lists the amount of sodium in one serving of the food. If you eat more than one serving, you  must multiply the listed amount of sodium by the number of servings. °Food labels may also identify foods as: °· Sodium free--Less than 5 mg in a serving. °· Very low sodium--35 mg or less in a serving. °· Low sodium--140 mg or less in a serving. °· Light in sodium--50% less sodium in a serving. For example, if a food that usually has 300 mg of sodium is changed to become light in sodium, it will have 150 mg of sodium. °· Reduced sodium--25% less sodium in a serving. For example, if a food that usually has 400 mg of sodium is changed to reduced sodium, it will have 300 mg of sodium. °WHAT FOODS CAN I EAT? °Grains  °Low-sodium cereals, including oats, puffed wheat and rice, and shredded wheat cereals. Low-sodium crackers. Unsalted rice and pasta. Lower-sodium bread.  °Vegetables  °Frozen or fresh vegetables. Low-sodium or reduced-sodium canned vegetables. Low-sodium or reduced-sodium tomato sauce and paste. Low-sodium or reduced-sodium tomato and vegetable juices.  °Fruits  °Fresh, frozen, and canned fruit. Fruit juice.  °Meat and Other Protein Products  °Low-sodium canned tuna and salmon. Fresh or frozen meat, poultry, seafood, and fish. Lamb. Unsalted nuts. Dried beans, peas, and lentils without added salt. Unsalted canned beans. Homemade soups without salt. Eggs.  °Dairy  °Milk. Soy milk. Ricotta cheese. Low-sodium or reduced-sodium cheeses. Yogurt.  °Condiments  °Fresh and dried herbs and spices. Salt-free seasonings. Onion and garlic powders. Low-sodium varieties of mustard and ketchup. Fresh or refrigerated horseradish. Lemon   juice.  °Fats and Oils   °Reduced-sodium salad dressings. Unsalted butter.   °Other  °Unsalted popcorn and pretzels.  °The items listed above may not be a complete list of recommended foods or beverages. Contact your dietitian for more options. °WHAT FOODS ARE NOT RECOMMENDED? °Grains  °Instant hot cereals. Bread stuffing, pancake, and biscuit mixes. Croutons. Seasoned rice or pasta mixes.  Noodle soup cups. Boxed or frozen macaroni and cheese. Self-rising flour. Regular salted crackers. °Vegetables  °Regular canned vegetables. Regular canned tomato sauce and paste. Regular tomato and vegetable juices. Frozen vegetables in sauces. Salted French fries. Olives. Pickles. Relishes. Sauerkraut. Salsa. °Meat and Other Protein Products  °Salted, canned, smoked, spiced, or pickled meats, seafood, or fish. Bacon, ham, sausage, hot dogs, corned beef, chipped beef, and packaged luncheon meats. Salt pork. Jerky. Pickled herring. Anchovies, regular canned tuna, and sardines. Salted nuts. °Dairy  °Processed cheese and cheese spreads. Cheese curds. Blue cheese and cottage cheese. Buttermilk.  °Condiments  °Onion and garlic salt, seasoned salt, table salt, and sea salt. Canned and packaged gravies. Worcestershire sauce. Tartar sauce. Barbecue sauce. Teriyaki sauce. Soy sauce, including reduced sodium. Steak sauce. Fish sauce. Oyster sauce. Cocktail sauce. Horseradish that you find on the shelf. Regular ketchup and mustard. Meat flavorings and tenderizers. Bouillon cubes. Hot sauce. Tabasco sauce. Marinades. Taco seasonings. Relishes. °Fats and Oils   °Regular salad dressings. Salted butter. Margarine. Ghee. Bacon fat.  °Other  °Potato and tortilla chips. Corn chips and puffs. Salted popcorn and pretzels. Canned or dried soups. Pizza. Frozen entrees and pot pies.   °The items listed above may not be a complete list of foods and beverages to avoid. Contact your dietitian for more information. °  °This information is not intended to replace advice given to you by your health care provider. Make sure you discuss any questions you have with your health care provider. °  °Document Released: 10/28/2001 Document Revised: 05/29/2014 Document Reviewed: 03/12/2013 °Elsevier Interactive Patient Education ©2016 Elsevier Inc. ° °

## 2015-12-07 ENCOUNTER — Telehealth: Payer: Self-pay | Admitting: Internal Medicine

## 2015-12-07 LAB — FLUORESCENT TREPONEMAL AB(FTA)-IGG-BLD: Fluorescent Treponemal ABS: REACTIVE — AB

## 2015-12-07 LAB — VDRL: VDRL, Serum: NONREACTIVE

## 2015-12-07 LAB — GC/CHLAMYDIA PROBE AMP
CT Probe RNA: NOT DETECTED
GC Probe RNA: NOT DETECTED

## 2015-12-07 LAB — RPR: RPR Ser Ql: REACTIVE — AB

## 2015-12-07 LAB — URINE CYTOLOGY ANCILLARY ONLY
CHLAMYDIA, DNA PROBE: NEGATIVE
NEISSERIA GONORRHEA: NEGATIVE
Trichomonas: NEGATIVE

## 2015-12-07 LAB — RPR TITER

## 2015-12-07 NOTE — Telephone Encounter (Signed)
Patient would like lab results.

## 2015-12-08 ENCOUNTER — Telehealth: Payer: Self-pay | Admitting: Internal Medicine

## 2015-12-08 NOTE — Telephone Encounter (Signed)
[facility] in regards to pts test results  Please follow up, thank you

## 2015-12-08 NOTE — Telephone Encounter (Signed)
Talked to pt about labs, confirmed dob.  Latent syphillis, still pending Igm labs. Per pt, she went to health dept yesterday.   Had no further questions

## 2015-12-09 NOTE — Telephone Encounter (Signed)
Labs have not been reviewed by PCP. Advise pt she will be contacted once PCP reviews and advises what to inform pt re labs results.

## 2015-12-10 NOTE — Telephone Encounter (Signed)
MA spoke with Melton Alarierra from the Health Dept. Melton Alarierra states patient tested positive for syphilis. Patient  [facility] support patients no need for further intervention. No further questions at this time.  MA faxed labs and received a confirmation successful transcript.

## 2015-12-13 ENCOUNTER — Encounter: Payer: Self-pay | Admitting: Internal Medicine

## 2015-12-26 ENCOUNTER — Emergency Department (HOSPITAL_COMMUNITY)
Admission: EM | Admit: 2015-12-26 | Discharge: 2015-12-26 | Disposition: A | Discharge status: Home / Self Care | Payer: Managed Care, Other (non HMO) | Attending: Emergency Medicine | Admitting: Emergency Medicine

## 2015-12-26 ENCOUNTER — Encounter (HOSPITAL_COMMUNITY): Payer: Self-pay | Admitting: Emergency Medicine

## 2015-12-26 DIAGNOSIS — Z79899 Other long term (current) drug therapy: Secondary | ICD-10-CM | POA: Insufficient documentation

## 2015-12-26 DIAGNOSIS — F909 Attention-deficit hyperactivity disorder, unspecified type: Secondary | ICD-10-CM

## 2015-12-26 DIAGNOSIS — I10 Essential (primary) hypertension: Secondary | ICD-10-CM

## 2015-12-26 DIAGNOSIS — Z760 Encounter for issue of repeat prescription: Secondary | ICD-10-CM | POA: Insufficient documentation

## 2015-12-26 DIAGNOSIS — M5441 Lumbago with sciatica, right side: Secondary | ICD-10-CM | POA: Insufficient documentation

## 2015-12-26 MED ORDER — CLONIDINE HCL 0.1 MG PO TABS: 0.1000 mg | ORAL_TABLET | Freq: Two times a day (BID) | ORAL | 3 refills | Status: DC

## 2015-12-26 MED ORDER — DICLOFENAC SODIUM 1 % TD GEL: 2.0000 g | Freq: Four times a day (QID) | TRANSDERMAL | 0 refills | Status: DC

## 2015-12-26 NOTE — ED Provider Notes (Signed)
[facility]-EMERGENCY DEPT Provider Note   CSN: [id number] Arrival date & time: 12/26/15  1110  First Provider Contact: First MD Initiated Contact with Patient 12/26/15 1135     By signing my name below, I, Rosario Adie, attest that this documentation has been prepared under the direction and in the presence of [facility], PA-C.  Electronically Signed: Rosario Adie, ED Scribe. 12/26/15. 11:48 AM.  History   Chief Complaint Chief Complaint  Patient presents with  . Back Pain  . Medication Refill   HPI HPI Comments: Brandi Collins is a 50 y.o. female who presents to the Emergency Department complaining of sudden onset, unchanged, intermittent, acute on chronic, right-sided, lower back pain s/p injury while at work that occurred ~2 months PTA. Pt notes that Brandi Collins has a hx of back pain from a herniated disc, confirmed through MRI. Pt has been seen in the ED 5 times in the past month for similar pain. Brandi Collins notes that Brandi Collins has run out of her rx'd pain medications and is requesting a refill. Brandi Collins states that Brandi Collins hasn't followed up with her PCP for her medication refills, but has called and tried to make an appointment prior to coming into the ED today.  No alleviating factors noted. Denies bowel/bladder incontinence, or any other associated symptoms.   Additionally, pt is requesting a refill of her rx'd Clonidine for her hx of HTN (last dose ~1 day ago) and "itch cream".   PCP: Jeanann Lewandowsky, MD, [facility]  Past Medical History:  Diagnosis Date  . ADHD (attention deficit hyperactivity disorder)     Patient Active Problem List   Diagnosis Date Noted  . Health care maintenance 04/22/2015  . Essential hypertension, benign 04/22/2015  . Annual physical exam 04/28/2013  . Pap smear for cervical cancer screening 04/28/2013  . 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 High blood pressure 04/28/2013   Past Surgical History:  Procedure Laterality Date  .  THROAT SURGERY     patient was 50 years old.     OB History    No data available     Home Medications    Prior to Admission medications   Medication Sig Start Date End Date Taking? Authorizing Provider  cloNIDine (CATAPRES) 0.1 MG tablet Take 1 tablet (0.1 mg total) by mouth 2 (two) times daily. 11/15/15   Pete Glatter, MD  cyclobenzaprine (FLEXERIL) 5 MG tablet Take 1 tablet (5 mg total) by mouth 3 (three) times daily as needed for muscle spasms. 10/26/15   Hayden Rasmussen, NP  diclofenac sodium (VOLTAREN) 1 % GEL Apply 2 g topically 4 (four) times daily. 11/15/15   Pete Glatter, MD  hydrochlorothiazide (HYDRODIURIL) 25 MG tablet Take 1 tablet (25 mg total) by mouth daily. 12/06/15   Pete Glatter, MD  HYDROcodone-acetaminophen (NORCO/VICODIN) 5-325 MG tablet Take 1 tablet by mouth every 4 (four) hours as needed. 08/10/15   Hayden Rasmussen, NP  meloxicam (MOBIC) 15 MG tablet Take 1 tablet (15 mg total) by mouth daily. 11/14/15   Dione Booze, MD  methocarbamol (ROBAXIN) 500 MG tablet Take 1 tablet (500 mg total) by mouth 4 (four) times daily as needed for muscle spasms. 11/14/15   Dione Booze, MD  moxifloxacin (VIGAMOX) 0.5 % ophthalmic solution Place 1 drop into both eyes every 2 (two) hours while awake. 1 drop in each eye every two hours while awake for first two days then every 4-8 hours for 5 days Patient not taking: Reported  on 12/06/2015 09/20/15   Rolm GalaStevi Barrett, PA-C  naproxen (NAPROSYN) 375 MG tablet Take 1 tablet (375 mg total) by mouth 2 (two) times daily. 10/26/15   Hayden Rasmussenavid Mabe, NP  naproxen sodium (ANAPROX DS) 550 MG tablet Take 1 tablet (550 mg total) by mouth 2 (two) times daily with a meal. 10/11/15   Tharon AquasFrank C Patrick, PA  oxyCODONE-acetaminophen (PERCOCET) 5-325 MG tablet Take 1 tablet by mouth every 4 (four) hours as needed for moderate pain. 11/14/15   Dione Boozeavid Glick, MD  tobramycin (TOBREX) 0.3 % ophthalmic ointment Place 1 application into both eyes 4 (four) times daily. Patient not taking:  Reported on 12/06/2015 08/10/15   Hayden Rasmussenavid Mabe, NP  traMADol (ULTRAM) 50 MG tablet Take 1 tablet (50 mg total) by mouth every 8 (eight) hours as needed. 11/15/15   Pete Glatterawn T Langeland, MD  Vitamin D, Ergocalciferol, (DRISDOL) 50000 units CAPS capsule Take 1 capsule (50,000 Units total) by mouth every 7 (seven) days. 11/16/15   Pete Glatterawn T Langeland, MD    Family History Family History  Problem Relation Age of Onset  . Hypertension Mother   . Heart disease Mother   . Stroke Mother   . Hypertension Daughter     Social History Social History  Substance Use Topics  . Smoking status: Never Smoker  . Smokeless tobacco: Never Used  . Alcohol use No   Allergies   Review of patient's allergies indicates no known allergies.  Review of Systems Review of Systems A complete 10 system review of systems was obtained and all systems are negative except as noted in the HPI and PMH.   Physical Exam Updated Vital Signs BP 121/75   Pulse 68   Temp 98.7 F (37.1 C) (Oral)   Resp 20   Ht 5' 8.5" (1.74 m)   Wt 164 lb (74.4 kg)   SpO2 100%   BMI 24.57 kg/m   Physical Exam  Constitutional: Brandi Collins appears well-developed and well-nourished.  HENT:  Head: Normocephalic.  Eyes: Conjunctivae are normal.  Neck: Normal range of motion.  Cardiovascular: Normal rate, regular rhythm and intact distal pulses.   Pulmonary/Chest: Effort normal. No respiratory distress. Brandi Collins has no wheezes. Brandi Collins has no rales. Brandi Collins exhibits no tenderness.  Abdominal: Soft. Brandi Collins exhibits no distension. There is no tenderness.  Musculoskeletal: Normal range of motion.  Neurological: Brandi Collins is alert.  No point tenderness to percussion of lumbar spinal processes.  No TTP or paraspinal muscular spasm. Strength is 5 out of 5 to bilateral lower extremities at hip and knee; extensor hallucis longus 5 out of 5. Ankle strength 5 out of 5, no clonus, neurovascularly intact. No saddle anaesthesia. Patellar reflexes are 2+ bilaterally.     Skin: Skin is  warm and dry.  Psychiatric: Brandi Collins has a normal mood and affect. Her behavior is normal.  Nursing note and vitals reviewed.  ED Treatments / Results  DIAGNOSTIC STUDIES: Oxygen Saturation is 100% on RA, normal by my interpretation.   COORDINATION OF CARE: 11:47 AM-Discussed next steps with pt. Pt verbalized understanding and is agreeable with the plan.   Procedures Procedures (including critical care time)  Medications Ordered in ED Medications - No data to display   Initial Impression / Assessment and Plan / ED Course  I have reviewed the triage vital signs and the nursing notes.  Pertinent labs & imaging results that were available during my care of the patient were reviewed by me and considered in my medical decision making (see chart for details).  Clinical Course    Vitals:   12/26/15 1115  BP: 121/75  Pulse: 68  Resp: 20  Temp: 98.7 F (37.1 C)  TempSrc: Oral  SpO2: 100%  Weight: 74.4 kg  Height: 5' 8.5" (1.74 m)     Brandi Collins is 50 y.o. female presenting with Multiple complaints including right-sided back pain, Brandi Collins also ran out of her clonidine yesterday, blood pressure is not significantly elevated today. Brandi Collins is requesting a refill on its medication as well. Patient will be given a prescription for Voltaren gel and approximately one week of clonidine, advised her Brandi Collins will need to follow closely with primary care for management of her chronic medical issues and patient verbalizes understanding.  Evaluation does not show pathology that would require ongoing emergent intervention or inpatient treatment. Pt is hemodynamically stable and mentating appropriately. Discussed findings and plan with patient/guardian, who agrees with care plan. All questions answered. Return precautions discussed and outpatient follow up given.     Final Clinical Impressions(s) / ED Diagnoses   Final diagnoses:  Right-sided low back pain with right-sided sciatica  Encounter for  medication refill    I personally performed the services described in this documentation, which was scribed in my presence. The recorded information has been reviewed and is accurate.     Wynetta Emery, PA-C 12/26/15 1258    Vanetta Mulders, MD 12/26/15 1455

## 2015-12-26 NOTE — ED Triage Notes (Addendum)
Pt low back pain ongoing since June due to an injury at work. Pt also request a medication refill for clonidine and itching cream.

## 2015-12-26 NOTE — Discharge Instructions (Signed)
Please follow with your primary care doctor in the next 2 days for a check-up. They must obtain records for further management.  ° °Do not hesitate to return to the Emergency Department for any new, worsening or concerning symptoms.  ° °

## 2016-01-14 ENCOUNTER — Encounter: Payer: Managed Care, Other (non HMO) | Admitting: Gastroenterology

## 2016-01-26 ENCOUNTER — Encounter: Payer: Self-pay | Admitting: Internal Medicine

## 2016-01-30 ENCOUNTER — Emergency Department (HOSPITAL_COMMUNITY)
Admission: EM | Admit: 2016-01-30 | Discharge: 2016-01-30 | Disposition: A | Discharge status: Home / Self Care | Payer: Self-pay | Attending: Emergency Medicine | Admitting: Emergency Medicine

## 2016-01-30 ENCOUNTER — Encounter (HOSPITAL_COMMUNITY): Payer: Self-pay | Admitting: Emergency Medicine

## 2016-01-30 DIAGNOSIS — M5442 Lumbago with sciatica, left side: Secondary | ICD-10-CM

## 2016-01-30 DIAGNOSIS — M5440 Lumbago with sciatica, unspecified side: Secondary | ICD-10-CM | POA: Insufficient documentation

## 2016-01-30 DIAGNOSIS — I10 Essential (primary) hypertension: Secondary | ICD-10-CM | POA: Insufficient documentation

## 2016-01-30 DIAGNOSIS — M5441 Lumbago with sciatica, right side: Secondary | ICD-10-CM

## 2016-01-30 DIAGNOSIS — F909 Attention-deficit hyperactivity disorder, unspecified type: Secondary | ICD-10-CM | POA: Insufficient documentation

## 2016-01-30 MED ORDER — PREDNISONE 20 MG PO TABS: 40.0000 mg | ORAL_TABLET | Freq: Every day | ORAL | 0 refills | Status: DC

## 2016-01-30 MED ORDER — NAPROXEN 250 MG PO TABS: 250.0000 mg | ORAL_TABLET | Freq: Two times a day (BID) | ORAL | 0 refills | Status: DC

## 2016-01-30 NOTE — ED Provider Notes (Signed)
[facility] DEPT Provider Note   CSN: [id number] Arrival date & time: 01/30/16  9562     History   Chief Complaint Chief Complaint  Patient presents with  . Back Pain    HPI Brandi Collins is a 50 y.o. female.  Brandi Collins is a 50 y.o. Female with a history of low back pain who presents to the ED complaining of continued bilateral low back pain. The patient reports she's had this low back pain for about 3 months now. She reports having an MRI of her lumbar spine in June. She reports she is out of her pain medicines currently. In the room she has a container of Mobic, but she reports she has not been taking this. She is requesting steroid shot. Patient reports she has pain in her bilateral low back that she rates it a 10 out of 10. She reports this radiates down her posterior legs to her knees. She reports her pain is worse with movement and with standing. Patient denies fevers, numbness, tingling, weakness, urinary symptoms, abdominal pain, nausea, vomiting, history of cancer, history of IV drug use, recent falls, loss of bowel control, loss of bladder control, difficulty urinating, saddle anesthesia, or rashes.    The history is provided by the patient. No language interpreter was used.  Back Pain   Pertinent negatives include no chest pain, no fever, no numbness, no headaches, no abdominal pain, no dysuria and no weakness.    Past Medical History:  Diagnosis Date  . ADHD (attention deficit hyperactivity disorder)     Patient Active Problem List   Diagnosis Date Noted  . Health care maintenance 04/22/2015  . Essential hypertension, benign 04/22/2015  . Annual physical exam 04/28/2013  . Pap smear for cervical cancer screening 04/28/2013  . 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 High blood pressure 04/28/2013    Past Surgical History:  Procedure Laterality Date  . THROAT SURGERY     patient was 50 years old.     OB History    No data available        Home Medications    Prior to Admission medications   Medication Sig Start Date End Date Taking? Authorizing Provider  cloNIDine (CATAPRES) 0.1 MG tablet Take 1 tablet (0.1 mg total) by mouth 2 (two) times daily. 12/26/15   Nicole Pisciotta, PA-C  cyclobenzaprine (FLEXERIL) 5 MG tablet Take 1 tablet (5 mg total) by mouth 3 (three) times daily as needed for muscle spasms. 10/26/15   Hayden Rasmussen, NP  diclofenac sodium (VOLTAREN) 1 % GEL Apply 2 g topically 4 (four) times daily. 12/26/15   Nicole Pisciotta, PA-C  hydrochlorothiazide (HYDRODIURIL) 25 MG tablet Take 1 tablet (25 mg total) by mouth daily. 12/06/15   Pete Glatter, MD  HYDROcodone-acetaminophen (NORCO/VICODIN) 5-325 MG tablet Take 1 tablet by mouth every 4 (four) hours as needed. 08/10/15   Hayden Rasmussen, NP  methocarbamol (ROBAXIN) 500 MG tablet Take 1 tablet (500 mg total) by mouth 4 (four) times daily as needed for muscle spasms. 11/14/15   Dione Booze, MD  naproxen (NAPROSYN) 250 MG tablet Take 1 tablet (250 mg total) by mouth 2 (two) times daily with a meal. 01/30/16   Everlene Farrier, PA-C  oxyCODONE-acetaminophen (PERCOCET) 5-325 MG tablet Take 1 tablet by mouth every 4 (four) hours as needed for moderate pain. 11/14/15   Dione Booze, MD  predniSONE (DELTASONE) 20 MG tablet Take 2 tablets (40 mg total) by mouth daily. 01/30/16   Everlene Farrier,  PA-C  tobramycin (TOBREX) 0.3 % ophthalmic ointment Place 1 application into both eyes 4 (four) times daily. Patient not taking: Reported on 12/06/2015 08/10/15   Hayden Rasmussen, NP  Vitamin D, Ergocalciferol, (DRISDOL) 50000 units CAPS capsule Take 1 capsule (50,000 Units total) by mouth every 7 (seven) days. 11/16/15   Pete Glatter, MD    Family History Family History  Problem Relation Age of Onset  . Hypertension Mother   . Heart disease Mother   . Stroke Mother   . Hypertension Daughter     Social History Social History  Substance Use Topics  . Smoking status: Never Smoker  .  Smokeless tobacco: Never Used  . Alcohol use No     Allergies   Review of patient's allergies indicates no known allergies.   Review of Systems Review of Systems  Constitutional: Negative for chills and fever.  HENT: Negative for sore throat.   Eyes: Negative for visual disturbance.  Respiratory: Negative for cough and shortness of breath.   Cardiovascular: Negative for chest pain.  Gastrointestinal: Negative for abdominal pain, diarrhea, nausea and vomiting.  Genitourinary: Negative for difficulty urinating, dysuria, frequency, hematuria, urgency, vaginal bleeding and vaginal discharge.  Musculoskeletal: Positive for back pain. Negative for neck pain.  Skin: Negative for rash.  Neurological: Negative for weakness, numbness and headaches.     Physical Exam Updated Vital Signs BP 113/81 (BP Location: Right Arm)   Pulse 76   Temp 98.3 F (36.8 C)   Resp 15   SpO2 98%   Physical Exam  Constitutional: She appears well-developed and well-nourished. No distress.  Nontoxic appearing.  HENT:  Head: Normocephalic and atraumatic.  Eyes: Conjunctivae are normal. Pupils are equal, round, and reactive to light. Right eye exhibits no discharge. Left eye exhibits no discharge.  Neck: Neck supple.  Cardiovascular: Normal rate, regular rhythm, normal heart sounds and intact distal pulses.   Pulmonary/Chest: Effort normal and breath sounds normal. No respiratory distress.  Abdominal: Soft. There is no tenderness.  Musculoskeletal: Normal range of motion. She exhibits no edema, tenderness or deformity.  No back tenderness to palpation. No midline back tenderness to palpation. No back erythema, deformity, ecchymosis or warmth. No lower extremity edema or tenderness. Patient has good strength in her bilateral lower extremities.  Lymphadenopathy:    She has no cervical adenopathy.  Neurological: She is alert. She has normal reflexes. She displays normal reflexes. Coordination normal.   Normal gait. Sensation is intact in her bilateral upper and lower extremities. Bilateral patellar DTRs are intact.  Skin: Skin is warm and dry. Capillary refill takes less than 2 seconds. No rash noted. She is not diaphoretic.  Psychiatric: She has a normal mood and affect. Her behavior is normal.  Nursing note and vitals reviewed.    ED Treatments / Results  Labs (all labs ordered are listed, but only abnormal results are displayed) Labs Reviewed - No data to display  EKG  EKG Interpretation None       Radiology No results found.  Procedures Procedures (including critical care time)  Medications Ordered in ED Medications - No data to display   Initial Impression / Assessment and Plan / ED Course  I have reviewed the triage vital signs and the nursing notes.  Pertinent labs & imaging results that were available during my care of the patient were reviewed by me and considered in my medical decision making (see chart for details).  Clinical Course   This is a 50 y.o. Female  with a history of low back pain who presents to the ED complaining of continued bilateral low back pain. The patient reports she's had this low back pain for about 3 months now. She reports having an MRI of her lumbar spine in June. She reports she is out of her pain medicines currently. In the room she has a container of low back but she reports she has not been taking this. She is requesting steroid shot. Patient reports she has pain in her bilateral low back that she rates it a 10 out of 10. She reports this radiates down her posterior legs to her knees. She reports her pain is worse with movement and with standing.  MRI of her lumbar spine on 11/14/2015 revealed small to moderate L5-S1 disc protrusion. On exam the patient is afebrile nontoxic appearing.  No neurological deficits and normal neuro exam.  Normal gait.  No loss of bowel or bladder control.  No concern for cauda equina.  No fever, night  sweats, weight loss, h/o cancer, IVDU.  Will provide her with a short course of oral steroid and naproxen for pain. I encouraged back exercises and I encouraged her to follow-up with primary care for possibly starting on gabapentin. I advised the patient to follow-up with their primary care provider this week. I advised the patient to return to the emergency department with new or worsening symptoms or new concerns. The patient verbalized understanding and agreement with plan.     Final Clinical Impressions(s) / ED Diagnoses   Final diagnoses:  Bilateral low back pain with sciatica, sciatica laterality unspecified    New Prescriptions New Prescriptions   NAPROXEN (NAPROSYN) 250 MG TABLET    Take 1 tablet (250 mg total) by mouth 2 (two) times daily with a meal.   PREDNISONE (DELTASONE) 20 MG TABLET    Take 2 tablets (40 mg total) by mouth daily.     Everlene FarrierWilliam Dinari Stgermaine, PA-C 01/30/16 0724    Everlene FarrierWilliam Juanmiguel Defelice, PA-C 01/30/16 [id number]    Zadie Rhineonald Wickline, MD 01/30/16 [phone]

## 2016-01-30 NOTE — ED Triage Notes (Signed)
Patient with lower back pain.  She states that she had a worker's comp claim and ended up getting terminated from job.  She lost her medical insurance and has had 3 months of not being treated.  She states that her lower back pain is getting worse, her knees are having pain and she can't hold up her body because of the pain.

## 2016-02-02 ENCOUNTER — Emergency Department (HOSPITAL_COMMUNITY)
Admission: EM | Admit: 2016-02-02 | Discharge: 2016-02-02 | Disposition: A | Discharge status: Home / Self Care | Payer: Self-pay | Attending: Emergency Medicine | Admitting: Emergency Medicine

## 2016-02-02 DIAGNOSIS — G8929 Other chronic pain: Secondary | ICD-10-CM | POA: Insufficient documentation

## 2016-02-02 DIAGNOSIS — M549 Dorsalgia, unspecified: Secondary | ICD-10-CM

## 2016-02-02 DIAGNOSIS — M546 Pain in thoracic spine: Secondary | ICD-10-CM | POA: Insufficient documentation

## 2016-02-02 DIAGNOSIS — F909 Attention-deficit hyperactivity disorder, unspecified type: Secondary | ICD-10-CM | POA: Insufficient documentation

## 2016-02-02 DIAGNOSIS — I10 Essential (primary) hypertension: Secondary | ICD-10-CM | POA: Insufficient documentation

## 2016-02-02 MED ORDER — CYCLOBENZAPRINE HCL 5 MG PO TABS: 5.0000 mg | ORAL_TABLET | Freq: Three times a day (TID) | ORAL | 0 refills | Status: DC | PRN

## 2016-02-02 NOTE — ED Notes (Signed)
PA at bedside.

## 2016-02-02 NOTE — ED Notes (Signed)
Pt in room walking back and forth with rapid speech. Pt states "I'm bipolar, I got hurt at work, I got fired from my job, I got the MRI and I don't know what that means, they gave me prednisone and it ain't working. When I stand up it hurts, sit down it hurts, my Medicaid has not come through. I just need to see an orthopedic.   Pt ambulatory without difficulty. Now sitting and showing paper work for previous work ups.

## 2016-02-02 NOTE — Discharge Instructions (Signed)
You have been diagnosed by your caregiver as having chest wall pain. °SEEK IMMEDIATE MEDICAL ATTENTION IF: °You develop a fever.  °Your chest pains become severe or intolerable.  °You develop new, unexplained symptoms (problems).  °You develop shortness of breath, nausea, vomiting, sweating or feel light headed.  °You develop a new cough or you cough up blood. ° °SEEK IMMEDIATE MEDICAL ATTENTION IF: °New numbness, tingling, weakness, or problem with the use of your arms or legs.  °Severe back pain not relieved with medications.  °Change in bowel or bladder control.  °Increasing pain in any areas of the body (such as chest or abdominal pain).  °Shortness of breath, dizziness or fainting.  °Nausea (feeling sick to your stomach), vomiting, fever, or sweats. ° °

## 2016-02-02 NOTE — ED Provider Notes (Signed)
[facility] Provider Note   CSN: [id number] Arrival date & time: 02/02/16  [id number]     History   Chief Complaint Chief Complaint  Patient presents with  . Back Pain    HPI Brandi Collins is a 50 y.o. female with a past medical history of chronic back pain. She is newly diabetic diagnosed with bipolar disorder. The patient presents to the emergency department seeking a consult with orthopedics. Patient states that she was injured at work a couple months ago and has had chronic right lower and right mid thoracic back pain since that time. She states that it is worse with certain movements and causes shooting pain down her right leg and across her right rib cage. She states that she was fired from her job as soon after the accident and also lost her insurance was unable to follow up with orthopedics. Denies weakness, loss of bowel/bladder function or saddle anesthesia. Denies neck stiffness, headache, rash.  Denies fever or recent procedures to back. Patient states that she took prednisone and it made her "totally insane."   HPI  Past Medical History:  Diagnosis Date  . ADHD (attention deficit hyperactivity disorder)     Patient Active Problem List   Diagnosis Date Noted  . Health care maintenance 04/22/2015  . Essential hypertension, benign 04/22/2015  . Annual physical exam 04/28/2013  . Pap smear for cervical cancer screening 04/28/2013  . 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 High blood pressure 04/28/2013    Past Surgical History:  Procedure Laterality Date  . THROAT SURGERY     patient was 50 years old.     OB History    No data available       Home Medications    Prior to Admission medications   Medication Sig Start Date End Date Taking? Authorizing Provider  cloNIDine (CATAPRES) 0.1 MG tablet Take 1 tablet (0.1 mg total) by mouth 2 (two) times daily. 12/26/15   Nicole Pisciotta, PA-C  cyclobenzaprine (FLEXERIL) 5 MG tablet Take 1 tablet (5 mg total) by  mouth 3 (three) times daily as needed for muscle spasms. 02/02/16   Arthor CaptainAbigail Edmonia Gonser, PA-C  diclofenac sodium (VOLTAREN) 1 % GEL Apply 2 g topically 4 (four) times daily. 12/26/15   Nicole Pisciotta, PA-C  hydrochlorothiazide (HYDRODIURIL) 25 MG tablet Take 1 tablet (25 mg total) by mouth daily. 12/06/15   Pete Glatterawn T Langeland, MD  naproxen (NAPROSYN) 250 MG tablet Take 1 tablet (250 mg total) by mouth 2 (two) times daily with a meal. 01/30/16   Everlene FarrierWilliam Dansie, PA-C  oxyCODONE-acetaminophen (PERCOCET) 5-325 MG tablet Take 1 tablet by mouth every 4 (four) hours as needed for moderate pain. 11/14/15   Dione Boozeavid Glick, MD  predniSONE (DELTASONE) 20 MG tablet Take 2 tablets (40 mg total) by mouth daily. 01/30/16   Everlene FarrierWilliam Dansie, PA-C  tobramycin (TOBREX) 0.3 % ophthalmic ointment Place 1 application into both eyes 4 (four) times daily. Patient not taking: Reported on 12/06/2015 08/10/15   Hayden Rasmussenavid Mabe, NP  Vitamin D, Ergocalciferol, (DRISDOL) 50000 units CAPS capsule Take 1 capsule (50,000 Units total) by mouth every 7 (seven) days. 11/16/15   Pete Glatterawn T Langeland, MD    Family History Family History  Problem Relation Age of Onset  . Hypertension Mother   . Heart disease Mother   . Stroke Mother   . Hypertension Daughter     Social History Social History  Substance Use Topics  . Smoking status: Never Smoker  . Smokeless tobacco: Never Used  .  Alcohol use No     Allergies   Review of patient's allergies indicates no known allergies.   Review of Systems Review of Systems  Constitutional: Negative for fever.  Genitourinary: Negative.   Neurological: Negative for weakness.     Physical Exam Updated Vital Signs BP 118/79 (BP Location: Right Arm)   Pulse 88   Temp 98.4 F (36.9 C) (Oral)   Resp 16   Ht 5\' 9"  (1.753 m)   Wt 74.8 kg   SpO2 98%   BMI 24.37 kg/m   Physical Exam Physical Exam  Nursing note and vitals reviewed. Constitutional: She is oriented to person, place, and time. She appears  well-developed and well-nourished. No distress.  HENT:  Head: Normocephalic and atraumatic.  Eyes: Conjunctivae normal and EOM are normal. Pupils are equal, round, and reactive to light. No scleral icterus.  Neck: Normal range of motion.  Cardiovascular: Normal rate, regular rhythm and normal heart sounds.  Exam reveals no gallop and no friction rub.   No murmur heard. Pulmonary/Chest: Effort normal and breath sounds normal. No respiratory distress.  Abdominal: Soft. Bowel sounds are normal. She exhibits no distension and no mass. There is no tenderness. There is no guarding.  Musculoskeletal: No tenderness to palpation along the midline or bilateral cervical, thoracic or lumbar spines. Patient complains of pain when reaching forward and twisting the thorax toward the right. Bilateral lower motor strength is 5 out of 5 with normal DTRs and sensation.  Neurological: She is alert and oriented to person, place, and time.  Skin: Skin is warm and dry. She is not diaphoretic. Psychiatric: The patient is hyperactive, pacing and moving all around the room. She appears to have good range of motion. Watching her. The patient has rapid and pressured speech. However, her speech is appropriate. She answers questions appropriately.     ED Treatments / Results  Labs (all labs ordered are listed, but only abnormal results are displayed) Labs Reviewed - No data to display  EKG  EKG Interpretation None       Radiology No results found.  Procedures Procedures (including critical care time)  Medications Ordered in ED Medications - No data to display   Initial Impression / Assessment and Plan / ED Course  I have reviewed the triage vital signs and the nursing notes.  Pertinent labs & imaging results that were available during my care of the patient were reviewed by me and considered in my medical decision making (see chart for details).  Clinical Course    Patient has a prescription for  naproxen, given 2 days ago. I will add Flexeril. Patient given referral to Dr. [place] Blas who is on-call for her back pain. No concern for cauda equina or infections of other kinds. No concern for significant neurologic impairment. The patient appears safe for discharge at this time  Final Clinical Impressions(s) / ED Diagnoses   Final diagnoses:  Chronic back pain    New Prescriptions Current Discharge Medication List       Arthor Captain, PA-C 02/02/16 [id number]    Laurence Spates, MD 02/02/16 1015

## 2016-02-02 NOTE — ED Triage Notes (Signed)
Pt is here for right side lower back pain. She had work related injury. Pt wants to have orthopedic consult today and reports that is why she is here. Pt has already had an MRI.

## 2016-02-08 ENCOUNTER — Ambulatory Visit: Payer: Self-pay | Attending: Internal Medicine

## 2016-02-08 DIAGNOSIS — Z23 Encounter for immunization: Secondary | ICD-10-CM

## 2016-02-08 NOTE — Progress Notes (Signed)
Patient here for flu vaccine only.

## 2016-02-23 ENCOUNTER — Encounter: Payer: Self-pay | Admitting: Internal Medicine

## 2016-02-24 ENCOUNTER — Encounter: Payer: Self-pay | Admitting: Internal Medicine

## 2016-03-14 ENCOUNTER — Encounter: Payer: Self-pay | Admitting: Internal Medicine

## 2016-03-14 ENCOUNTER — Ambulatory Visit: Payer: Self-pay | Attending: Internal Medicine | Admitting: Internal Medicine

## 2016-03-14 VITALS — BP 130/80 | HR 62 | Temp 98.0°F | Resp 16 | Wt 165.0 lb

## 2016-03-14 DIAGNOSIS — F909 Attention-deficit hyperactivity disorder, unspecified type: Secondary | ICD-10-CM | POA: Insufficient documentation

## 2016-03-14 DIAGNOSIS — M545 Low back pain: Secondary | ICD-10-CM

## 2016-03-14 DIAGNOSIS — H6123 Impacted cerumen, bilateral: Secondary | ICD-10-CM

## 2016-03-14 DIAGNOSIS — E559 Vitamin D deficiency, unspecified: Secondary | ICD-10-CM

## 2016-03-14 DIAGNOSIS — Z124 Encounter for screening for malignant neoplasm of cervix: Secondary | ICD-10-CM

## 2016-03-14 DIAGNOSIS — Z0000 Encounter for general adult medical examination without abnormal findings: Secondary | ICD-10-CM

## 2016-03-14 DIAGNOSIS — I10 Essential (primary) hypertension: Secondary | ICD-10-CM

## 2016-03-14 LAB — LIPID PANEL
CHOLESTEROL: 173 mg/dL (ref 125–200)
HDL: 73 mg/dL (ref >=46)
LDL CALC: 87 mg/dL (ref <130)
Total CHOL/HDL Ratio: 2.4 Ratio (ref <=5.0)
Triglycerides: 64 mg/dL (ref <150)
VLDL: 13 mg/dL (ref <30)

## 2016-03-14 MED ORDER — CARBAMIDE PEROXIDE 6.5 % OT SOLN: 5.0000 [drp] | Freq: Two times a day (BID) | OTIC | 0 refills | Status: DC

## 2016-03-14 NOTE — Patient Instructions (Addendum)
DASH Eating Plan DASH stands for "Dietary Approaches to Stop Hypertension." The DASH eating plan is a healthy eating plan that has been shown to reduce high blood pressure (hypertension). Additional health benefits may include reducing the risk of type 2 diabetes mellitus, heart disease, and stroke. The DASH eating plan may also help with weight loss. WHAT DO I NEED TO KNOW ABOUT THE DASH EATING PLAN? For the DASH eating plan, you will follow these general guidelines:  Choose foods with a percent daily value for sodium of less than 5% (as listed on the food label).  Use salt-free seasonings or herbs instead of table salt or sea salt.  Check with your health care provider or pharmacist before using salt substitutes.  Eat lower-sodium products, often labeled as "lower sodium" or "no salt added."  Eat fresh foods.  Eat more vegetables, fruits, and low-fat dairy products.  Choose whole grains. Look for the word "whole" as the first word in the ingredient list.  Choose fish and skinless chicken or [place] more often than red meat. Limit fish, poultry, and meat to 6 oz (170 g) each day.  Limit sweets, desserts, sugars, and sugary drinks.  Choose heart-healthy fats.  Limit cheese to 1 oz (28 g) per day.  Eat more home-cooked food and less restaurant, buffet, and fast food.  Limit fried foods.  Cook foods using methods other than frying.  Limit canned vegetables. If you do use them, rinse them well to decrease the sodium.  When eating at a restaurant, ask that your food be prepared with less salt, or no salt if possible. WHAT FOODS CAN I EAT? Seek help from a dietitian for individual calorie needs. Grains Whole grain or whole wheat bread. Brown rice. Whole grain or whole wheat pasta. Quinoa, bulgur, and whole grain cereals. Low-sodium cereals. Corn or whole wheat flour tortillas. Whole grain cornbread. Whole grain crackers. Low-sodium crackers. Vegetables Fresh or frozen vegetables  (raw, steamed, roasted, or grilled). Low-sodium or reduced-sodium tomato and vegetable juices. Low-sodium or reduced-sodium tomato sauce and paste. Low-sodium or reduced-sodium canned vegetables.  Fruits All fresh, canned (in natural juice), or frozen fruits. Meat and Other Protein Products Ground beef (85% or leaner), grass-fed beef, or beef trimmed of fat. Skinless chicken or [place]. Ground chicken or [place]. Pork trimmed of fat. All fish and seafood. Eggs. Dried beans, peas, or lentils. Unsalted nuts and seeds. Unsalted canned beans. Dairy Low-fat dairy products, such as skim or 1% milk, 2% or reduced-fat cheeses, low-fat ricotta or cottage cheese, or plain low-fat yogurt. Low-sodium or reduced-sodium cheeses. Fats and Oils Tub margarines without trans fats. Light or reduced-fat mayonnaise and salad dressings (reduced sodium). Avocado. Safflower, olive, or canola oils. Natural peanut or almond butter. Other Unsalted popcorn and pretzels. The items listed above may not be a complete list of recommended foods or beverages. Contact your dietitian for more options. WHAT FOODS ARE NOT RECOMMENDED? Grains White bread. White pasta. White rice. Refined cornbread. Bagels and croissants. Crackers that contain trans fat. Vegetables Creamed or fried vegetables. Vegetables in a cheese sauce. Regular canned vegetables. Regular canned tomato sauce and paste. Regular tomato and vegetable juices. Fruits Dried fruits. Canned fruit in light or heavy syrup. Fruit juice. Meat and Other Protein Products Fatty cuts of meat. Ribs, chicken wings, bacon, sausage, bologna, salami, chitterlings, fatback, hot dogs, bratwurst, and packaged luncheon meats. Salted nuts and seeds. Canned beans with salt. Dairy Whole or 2% milk, cream, half-and-half, and cream cheese. Whole-fat or sweetened yogurt. Full-fat  cheeses or blue cheese. Nondairy creamers and whipped toppings. Processed cheese, cheese spreads, or cheese  curds. Condiments Onion and garlic salt, seasoned salt, table salt, and sea salt. Canned and packaged gravies. Worcestershire sauce. Tartar sauce. Barbecue sauce. Teriyaki sauce. Soy sauce, including reduced sodium. Steak sauce. Fish sauce. Oyster sauce. Cocktail sauce. Horseradish. Ketchup and mustard. Meat flavorings and tenderizers. Bouillon cubes. Hot sauce. Tabasco sauce. Marinades. Taco seasonings. Relishes. Fats and Oils Butter, stick margarine, lard, shortening, ghee, and bacon fat. Coconut, palm kernel, or palm oils. Regular salad dressings. Other Pickles and olives. Salted popcorn and pretzels. The items listed above may not be a complete list of foods and beverages to avoid. Contact your dietitian for more information. WHERE CAN I FIND MORE INFORMATION? National [employer], [employer], [employer]: travelstabloid.com   This information is not intended to replace advice given to you by your health care provider. Make sure you discuss any questions you have with your health care provider.   Document Released: 04/27/2011 Document Revised: 05/29/2014 Document Reviewed: 03/12/2013 Elsevier Interactive Patient Education 2016 [place] is a normal process in which your reproductive ability comes to an end. This process happens gradually over a span of months to years, usually between the ages of 34 and 49. Menopause is complete when you have missed 12 consecutive menstrual periods. It is important to talk with your health care provider about some of the most common conditions that affect postmenopausal women, such as heart disease, cancer, and bone loss (osteoporosis). Adopting a healthy lifestyle and getting preventive care can help to promote your health and wellness. Those actions can also lower your chances of developing some of these common conditions. WHAT SHOULD I KNOW ABOUT MENOPAUSE? During menopause, you may experience a number of  symptoms, such as:  Moderate-to-severe hot flashes.  Night sweats.  Decrease in sex drive.  Mood swings.  Headaches.  Tiredness.  Irritability.  Memory problems.  Insomnia. Choosing to treat or not to treat menopausal changes is an individual decision that you make with your health care provider. WHAT SHOULD I KNOW ABOUT HORMONE REPLACEMENT THERAPY AND SUPPLEMENTS? Hormone therapy products are effective for treating symptoms that are associated with menopause, such as hot flashes and night sweats. Hormone replacement carries certain risks, especially as you become older. If you are thinking about using estrogen or estrogen with progestin treatments, discuss the benefits and risks with your health care provider. WHAT SHOULD I KNOW ABOUT HEART DISEASE AND STROKE? Heart disease, heart attack, and stroke become more likely as you age. This may be due, in part, to the hormonal changes that your body experiences during menopause. These can affect how your body processes dietary fats, triglycerides, and cholesterol. Heart attack and stroke are both medical emergencies. There are many things that you can do to help prevent heart disease and stroke:  Have your blood pressure checked at least every 1-2 years. High blood pressure causes heart disease and increases the risk of stroke.  If you are 24-30 years old, ask your health care provider if you should take aspirin to prevent a heart attack or a stroke.  Do not use any tobacco products, including cigarettes, chewing tobacco, or electronic cigarettes. If you need help quitting, ask your health care provider.  It is important to eat a healthy diet and maintain a healthy weight.  Be sure to include plenty of vegetables, fruits, low-fat dairy products, and lean protein.  Avoid eating foods that are high in solid  fats, added sugars, or salt (sodium).  Get regular exercise. This is one of the most important things that you can do for your  health.  Try to exercise for at least 150 minutes each week. The type of exercise that you do should increase your heart rate and make you sweat. This is known as moderate-intensity exercise.  Try to do strengthening exercises at least twice each week. Do these in addition to the moderate-intensity exercise.  Know your numbers.Ask your health care provider to check your cholesterol and your blood glucose. Continue to have your blood tested as directed by your health care provider. WHAT SHOULD I KNOW ABOUT CANCER SCREENING? There are several types of cancer. Take the following steps to reduce your risk and to catch any cancer development as early as possible. Breast Cancer  Practice breast self-awareness.  This means understanding how your breasts normally appear and feel.  It also means doing regular breast self-exams. Let your health care provider know about any changes, no matter how small.  If you are 40 or older, have a clinician do a breast exam (clinical breast exam or CBE) every year. Depending on your age, family history, and medical history, it may be recommended that you also have a yearly breast X-ray (mammogram).  If you have a family history of breast cancer, talk with your health care provider about genetic screening.  If you are at high risk for breast cancer, talk with your health care provider about having an MRI and a mammogram every year.  Breast cancer (BRCA) gene test is recommended for women who have family members with BRCA-related cancers. Results of the assessment will determine the need for genetic counseling and BRCA1 and for BRCA2 testing. BRCA-related cancers include these types:  Breast. This occurs in males or females.  Ovarian.  Tubal. This may also be called fallopian tube cancer.  Cancer of the abdominal or pelvic lining (peritoneal cancer).  Prostate.  Pancreatic. Cervical, Uterine, and Ovarian Cancer Your health care provider may recommend  that you be screened regularly for cancer of the pelvic organs. These include your ovaries, uterus, and vagina. This screening involves a pelvic exam, which includes checking for microscopic changes to the surface of your cervix (Pap test).  For women ages 21-65, health care providers may recommend a pelvic exam and a Pap test every three years. For women ages 10-65, they may recommend the Pap test and pelvic exam, combined with testing for human papilloma virus (HPV), every five years. Some types of HPV increase your risk of cervical cancer. Testing for HPV may also be done on women of any age who have unclear Pap test results.  Other health care providers may not recommend any screening for nonpregnant women who are considered low risk for pelvic cancer and have no symptoms. Ask your health care provider if a screening pelvic exam is right for you.  If you have had past treatment for cervical cancer or a condition that could lead to cancer, you need Pap tests and screening for cancer for at least 20 years after your treatment. If Pap tests have been discontinued for you, your risk factors (such as having a new sexual partner) need to be reassessed to determine if you should start having screenings again. Some women have medical problems that increase the chance of getting cervical cancer. In these cases, your health care provider may recommend that you have screening and Pap tests more often.  If you have a family history  of uterine cancer or ovarian cancer, talk with your health care provider about genetic screening.  If you have vaginal bleeding after reaching menopause, tell your health care provider.  There are currently no reliable tests available to screen for ovarian cancer. Lung Cancer Lung cancer screening is recommended for adults 96-25 years old who are at high risk for lung cancer because of a history of smoking. A yearly low-dose CT scan of the lungs is recommended if you:  Currently  smoke.  Have a history of at least 30 pack-years of smoking and you currently smoke or have quit within the past 15 years. A pack-year is smoking an average of one pack of cigarettes per day for one year. Yearly screening should:  Continue until it has been 15 years since you quit.  Stop if you develop a health problem that would prevent you from having lung cancer treatment. Colorectal Cancer  This type of cancer can be detected and can often be prevented.  Routine colorectal cancer screening usually begins at age 67 and continues through age 56.  If you have risk factors for colon cancer, your health care provider may recommend that you be screened at an earlier age.  If you have a family history of colorectal cancer, talk with your health care provider about genetic screening.  Your health care provider may also recommend using home test kits to check for hidden blood in your stool.  A small camera at the end of a tube can be used to examine your colon directly (sigmoidoscopy or colonoscopy). This is done to check for the earliest forms of colorectal cancer.  Direct examination of the colon should be repeated every 5-10 years until age 55. However, if early forms of precancerous polyps or small growths are found or if you have a family history or genetic risk for colorectal cancer, you may need to be screened more often. Skin Cancer  Check your skin from head to toe regularly.  Monitor any moles. Be sure to tell your health care provider:  About any new moles or changes in moles, especially if there is a change in a mole's shape or color.  If you have a mole that is larger than the size of a pencil eraser.  If any of your family members has a history of skin cancer, especially at a young age, talk with your health care provider about genetic screening.  Always use sunscreen. Apply sunscreen liberally and repeatedly throughout the day.  Whenever you are outside, protect  yourself by wearing long sleeves, pants, a wide-brimmed hat, and sunglasses. WHAT SHOULD I KNOW ABOUT OSTEOPOROSIS? Osteoporosis is a condition in which bone destruction happens more quickly than new bone creation. After menopause, you may be at an increased risk for osteoporosis. To help prevent osteoporosis or the bone fractures that can happen because of osteoporosis, the following is recommended:  If you are 57-46 years old, get at least 1,000 mg of calcium and at least 600 mg of vitamin D per day.  If you are older than age 54 but younger than age 69, get at least 1,200 mg of calcium and at least 600 mg of vitamin D per day.  If you are older than age 16, get at least 1,200 mg of calcium and at least 800 mg of vitamin D per day. Smoking and excessive alcohol intake increase the risk of osteoporosis. Eat foods that are rich in calcium and vitamin D, and do weight-bearing exercises several times each  week as directed by your health care provider. WHAT SHOULD I KNOW ABOUT HOW MENOPAUSE AFFECTS MY MENTAL HEALTH? Depression may occur at any age, but it is more common as you become older. Common symptoms of depression include:  Low or sad mood.  Changes in sleep patterns.  Changes in appetite or eating patterns.  Feeling an overall lack of motivation or enjoyment of activities that you previously enjoyed.  Frequent crying spells. Talk with your health care provider if you think that you are experiencing depression. WHAT SHOULD I KNOW ABOUT IMMUNIZATIONS? It is important that you get and maintain your immunizations. These include:  Tetanus, diphtheria, and pertussis (Tdap) booster vaccine.  Influenza every year before the flu season begins.  Pneumonia vaccine.  Shingles vaccine. Your health care provider may also recommend other immunizations.   This information is not intended to replace advice given to you by your health care provider. Make sure you discuss any questions you have  with your health care provider.   Document Released: 06/30/2005 Document Revised: 05/29/2014 Document Reviewed: 01/08/2014 Elsevier Interactive Patient Education 2016 [employer]  -  Back Exercises If you have pain in your back, do these exercises 2-3 times each day or as told by your doctor. When the pain goes away, do the exercises once each day, but repeat the steps more times for each exercise (do more repetitions). If you do not have pain in your back, do these exercises once each day or as told by your doctor. EXERCISES Single Knee to Chest Do these steps 3-5 times in a row for each leg: 1. Lie on your back on a firm bed or the floor with your legs stretched out. 2. Bring one knee to your chest. 3. Hold your knee to your chest by grabbing your knee or thigh. 4. Pull on your knee until you feel a gentle stretch in your lower back. 5. Keep doing the stretch for 10-30 seconds. 6. Slowly let go of your leg and straighten it. Pelvic Tilt Do these steps 5-10 times in a row: 1. Lie on your back on a firm bed or the floor with your legs stretched out. 2. Bend your knees so they point up to the ceiling. Your feet should be flat on the floor. 3. Tighten your lower belly (abdomen) muscles to press your lower back against the floor. This will make your tailbone point up to the ceiling instead of pointing down to your feet or the floor. 4. Stay in this position for 5-10 seconds while you gently tighten your muscles and breathe evenly. Cat-Cow Do these steps until your lower back bends more easily: 1. Get on your hands and knees on a firm surface. Keep your hands under your shoulders, and keep your knees under your hips. You may put padding under your knees. 2. Let your head hang down, and make your tailbone point down to the floor so your lower back is round like the back of a cat. 3. Stay in this position for 5 seconds. 4. Slowly lift your head and make your tailbone point up to the ceiling  so your back hangs low (sags) like the back of a cow. 5. Stay in this position for 5 seconds. Press-Ups Do these steps 5-10 times in a row: 1. Lie on your belly (face-down) on the floor. 2. Place your hands near your head, about shoulder-width apart. 3. While you keep your back relaxed and keep your hips on the floor, slowly straighten your arms  to raise the top half of your body and lift your shoulders. Do not use your back muscles. To make yourself more comfortable, you may change where you place your hands. 4. Stay in this position for 5 seconds. 5. Slowly return to lying flat on the floor. Bridges Do these steps 10 times in a row: 1. Lie on your back on a firm surface. 2. Bend your knees so they point up to the ceiling. Your feet should be flat on the floor. 3. Tighten your butt muscles and lift your butt off of the floor until your waist is almost as high as your knees. If you do not feel the muscles working in your butt and the back of your thighs, slide your feet 1-2 inches farther away from your butt. 4. Stay in this position for 3-5 seconds. 5. Slowly lower your butt to the floor, and let your butt muscles relax. If this exercise is too easy, try doing it with your arms crossed over your chest. Belly Crunches Do these steps 5-10 times in a row: 1. Lie on your back on a firm bed or the floor with your legs stretched out. 2. Bend your knees so they point up to the ceiling. Your feet should be flat on the floor. 3. Cross your arms over your chest. 4. Tip your chin a little bit toward your chest but do not bend your neck. 5. Tighten your belly muscles and slowly raise your chest just enough to lift your shoulder blades a tiny bit off of the floor. 6. Slowly lower your chest and your head to the floor. Back Lifts Do these steps 5-10 times in a row: 1. Lie on your belly (face-down) with your arms at your sides, and rest your forehead on the floor. 2. Tighten the muscles in your legs  and your butt. 3. Slowly lift your chest off of the floor while you keep your hips on the floor. Keep the back of your head in line with the curve in your back. Look at the floor while you do this. 4. Stay in this position for 3-5 seconds. 5. Slowly lower your chest and your face to the floor. GET HELP IF:  Your back pain gets a lot worse when you do an exercise.  Your back pain does not lessen 2 hours after you exercise. If you have any of these problems, stop doing the exercises. Do not do them again unless your doctor says it is okay. GET HELP RIGHT AWAY IF:  You have sudden, very bad back pain. If this happens, stop doing the exercises. Do not do them again unless your doctor says it is okay.   This information is not intended to replace advice given to you by your health care provider. Make sure you discuss any questions you have with your health care provider.   Document Released: 06/10/2010 Document Revised: 01/27/2015 Document Reviewed: 07/02/2014 Elsevier Interactive Patient Education [employer].

## 2016-03-14 NOTE — Progress Notes (Signed)
Pt is in the office today for a physical Pt states she is not in any pain  

## 2016-03-14 NOTE — Progress Notes (Signed)
Brandi Collins, is a 50 y.o. female  ZOX:[id number]:[id number]  [id number]:[id number]  DOB - 22-Feb-1966  Chief Complaint  Patient presents with  . Annual Exam        Subjective:   Brandi Pacificawanna Mahn is a 50 y.o. female here today for a follow up visit.  Pt states wants a full physical today.  She has pending appt w/ ortho on 03/29/16.  She missed her colonoscopy procedure, but plans to do it soon. Has all the medicines to take for prep.  Pt denies smoking, ivdu, etoh.  Feels well overall except for her lower back pain, which causes her issues intermittently.  Patient has No headache, No chest pain, No abdominal pain - No Nausea, No new weakness tingling or numbness, No Cough - SOB.  No problems updated.  ALLERGIES: No Known Allergies  PAST MEDICAL HISTORY: Past Medical History:  Diagnosis Date  . ADHD (attention deficit hyperactivity disorder)     MEDICATIONS AT HOME: Prior to Admission medications   Medication Sig Start Date End Date Taking? Authorizing Provider  cloNIDine (CATAPRES) 0.1 MG tablet Take 1 tablet (0.1 mg total) by mouth 2 (two) times daily. Patient not taking: Reported on 03/14/2016 12/26/15   Joni ReiningNicole Pisciotta, PA-C  cyclobenzaprine (FLEXERIL) 5 MG tablet Take 1 tablet (5 mg total) by mouth 3 (three) times daily as needed for muscle spasms. Patient not taking: Reported on 03/14/2016 02/02/16   Arthor CaptainAbigail Harris, PA-C  diclofenac sodium (VOLTAREN) 1 % GEL Apply 2 g topically 4 (four) times daily. Patient not taking: Reported on 03/14/2016 12/26/15   Joni ReiningNicole Pisciotta, PA-C  hydrochlorothiazide (HYDRODIURIL) 25 MG tablet Take 1 tablet (25 mg total) by mouth daily. Patient not taking: Reported on 03/14/2016 12/06/15   Pete Glatterawn T Lottie Sigman, MD  naproxen (NAPROSYN) 250 MG tablet Take 1 tablet (250 mg total) by mouth 2 (two) times daily with a meal. Patient not taking: Reported on 03/14/2016 01/30/16   Everlene FarrierWilliam Dansie, PA-C  oxyCODONE-acetaminophen (PERCOCET) 5-325 MG tablet Take 1 tablet by  mouth every 4 (four) hours as needed for moderate pain. Patient not taking: Reported on 03/14/2016 11/14/15   Dione Boozeavid Glick, MD  predniSONE (DELTASONE) 20 MG tablet Take 2 tablets (40 mg total) by mouth daily. Patient not taking: Reported on 03/14/2016 01/30/16   Everlene FarrierWilliam Dansie, PA-C  tobramycin (TOBREX) 0.3 % ophthalmic ointment Place 1 application into both eyes 4 (four) times daily. Patient not taking: Reported on 03/14/2016 08/10/15   Hayden Rasmussenavid Mabe, NP  Vitamin D, Ergocalciferol, (DRISDOL) 50000 units CAPS capsule Take 1 capsule (50,000 Units total) by mouth every 7 (seven) days. Patient not taking: Reported on 03/14/2016 11/16/15   Pete Glatterawn T Grainne Knights, MD     Objective:   Vitals:   03/14/16 1413  BP: 130/80  Pulse: 62  Resp: 16  Temp: 98 F (36.7 C)  TempSrc: Oral  SpO2: 99%  Weight: 165 lb (74.8 kg)   CMA assisted w/ well woman exam.  Exam General appearance : Awake, alert, not in any distress. Speech Clear. Not toxic looking, thin appearing. ROM intact, no issues getting up to exam table. HEENT: Atraumatic and Normocephalic, pupils equally reactive to light. Some healing trauma in bilat ear canels w/ dry blood, pt states puts in hairpins to try to remove wax.  Mild ceruminosis in canals but no obstruction. Tms clear. Neck: supple, no JVD. No cervical lymphadenopathy.  Chest:Good air entry bilaterally, no added sounds. Breast /axilla: bilat nml appearance, not dippling noted. No palpable masses/nodules/nipple discharge noted on exam  CVS: S1 S2 regular, no murmurs/gallups or rubs. Abdomen: Bowel sounds active, Non tender and not distended with no gaurding, rigidity or rebound.   Extremities: B/L Lower Ext shows no edema, both legs are warm to touch Pelvic Exam: Cervix normal in appearance, external genitalia normal, no adnexal masses or tenderness, no cervical motion tenderness, rectovaginal septum normal, uterus normal size, shape, and consistency and vagina normal with thin white  discharge , no odor.  Neurology: Awake alert, and oriented X 3, CN II-XII grossly intact, Non focal Skin:No Rash  Data Review No results found for: HGBA1C  Depression screen [facility] 2/9 03/14/2016 12/06/2015 11/15/2015 04/22/2015 04/28/2013  Decreased Interest 1 0 0 3 0  Down, Depressed, Hopeless 0 0 0 1 0  PHQ - 2 Score 1 0 0 4 0  Altered sleeping - - - 2 -  Tired, decreased energy - - - 1 -  Change in appetite - - - 3 -  Feeling bad or failure about yourself  - - - 1 -  Trouble concentrating - - - 1 -  Moving slowly or fidgety/restless - - - 1 -  Suicidal thoughts - - - 1 -  PHQ-9 Score - - - 14 -      Assessment & Plan   1. Pap smear for cervical cancer screening - no menses for years. Denies abnml breast masses/nipple discharge,  - may have BV, and may benefit w/ probiotics if positive, will wait for labs - Cytology - PAP  2. Essential hypertension, benign Per pt, tells me she is taking all her meds (hctz 25qd), but she told my CMA she is not taking it, so I am not certain. Either way, bp controlled, recd DASH diet, exercise. - Lipid Panel  3. Annual physical exam See above.  4. Vitamin D deficiency Had rx recently, not currently taking meds, will chk levels.  May benefit from DEXA as well depending on levels. - VITAMIN D 25 Hydroxy (Vit-D Deficiency, Fractures)  5. Low back pain, unspecified back pain laterality, unspecified chronicity, with sciatica presence unspecified - has appt w/ ortho, DR Ophelia Charter, 11/8, encouraged to keep appt. - Ambulatory referral to Physical Therapy  6. Colon cancer screening - she missed her last appt 8/17, encouraged to set up. She has ppwk for discount and meds, and plans to do soon.   Patient have been counseled extensively about nutrition and exercise  Return in about 3 months (around 06/14/2016).  The patient was given clear instructions to go to ER or return to medical center if symptoms don't improve, worsen or new problems develop.  The patient verbalized understanding. The patient was told to call to get lab results if they haven't heard anything in the next week.   This note has been created with Education officer, environmental. Any transcriptional errors are unintentional.   Pete Glatter, MD, MBA/MHA [facility] and [facility] [place], [place] [phone]   03/14/2016, 2:36 PM

## 2016-03-15 ENCOUNTER — Other Ambulatory Visit: Payer: Self-pay | Admitting: Internal Medicine

## 2016-03-15 ENCOUNTER — Telehealth: Payer: Self-pay

## 2016-03-15 LAB — CYTOLOGY - PAP
DIAGNOSIS: NEGATIVE
HPV (WINDOPATH): NOT DETECTED

## 2016-03-15 LAB — CERVICOVAGINAL ANCILLARY ONLY
CHLAMYDIA, DNA PROBE: NEGATIVE
NEISSERIA GONORRHEA: NEGATIVE
Wet Prep (BD Affirm): POSITIVE — AB

## 2016-03-15 LAB — VITAMIN D 25 HYDROXY (VIT D DEFICIENCY, FRACTURES): Vit D, 25-Hydroxy: 8 ng/mL — ABNORMAL LOW (ref 30–100)

## 2016-03-15 MED ORDER — VITAMIN D (ERGOCALCIFEROL) 1.25 MG (50000 UNIT) PO CAPS: 50000.0000 [IU] | ORAL_CAPSULE | ORAL | 0 refills | Status: DC

## 2016-03-15 NOTE — Telephone Encounter (Signed)
Contacted pt to go over lab results pt didn't answer lvm asking pt to give me a call back at her earliest convenience  

## 2016-03-17 ENCOUNTER — Telehealth: Payer: Self-pay

## 2016-03-17 LAB — CERVICOVAGINAL ANCILLARY ONLY: HERPES (WINDOWPATH): NEGATIVE

## 2016-03-17 NOTE — Telephone Encounter (Signed)
Contacted pt to go over lab results pt didn't answer lvm informing her that I will be mailing results out and if she has any questions or concerns to give me a call

## 2016-03-20 ENCOUNTER — Ambulatory Visit: Payer: Self-pay | Attending: Internal Medicine | Admitting: Physical Therapy

## 2016-03-20 ENCOUNTER — Other Ambulatory Visit: Payer: Self-pay | Admitting: Internal Medicine

## 2016-03-20 MED ORDER — METRONIDAZOLE 500 MG PO TABS: 500.0000 mg | ORAL_TABLET | Freq: Two times a day (BID) | ORAL | 0 refills | Status: DC

## 2016-03-22 ENCOUNTER — Telehealth: Payer: Self-pay

## 2016-03-22 NOTE — Telephone Encounter (Signed)
Contacted pt to go over lab results pt is aware of results and doesn't have any questions or concerns 

## 2016-03-27 ENCOUNTER — Ambulatory Visit (INDEPENDENT_AMBULATORY_CARE_PROVIDER_SITE_OTHER): Payer: Self-pay

## 2016-03-27 ENCOUNTER — Ambulatory Visit (INDEPENDENT_AMBULATORY_CARE_PROVIDER_SITE_OTHER): Payer: Self-pay | Admitting: Orthopaedic Surgery

## 2016-03-27 ENCOUNTER — Encounter (INDEPENDENT_AMBULATORY_CARE_PROVIDER_SITE_OTHER): Payer: Self-pay | Admitting: Orthopaedic Surgery

## 2016-03-27 VITALS — BP 133/87 | HR 60 | Ht 69.0 in | Wt 165.0 lb

## 2016-03-27 DIAGNOSIS — M545 Low back pain, unspecified: Secondary | ICD-10-CM | POA: Insufficient documentation

## 2016-03-27 DIAGNOSIS — M5442 Lumbago with sciatica, left side: Secondary | ICD-10-CM

## 2016-03-27 DIAGNOSIS — M5441 Lumbago with sciatica, right side: Secondary | ICD-10-CM

## 2016-03-27 NOTE — Progress Notes (Signed)
Office Visit Note   Patient: Brandi Collins           Date of Birth: 1965-08-28           MRN: [id number] Visit Date: 03/27/2016              Requested by: Brandi Angstlugbemiga E Jegede, MD [address] [place], [place] [id number] PCP: Jeanann LewandowskyJEGEDE, OLUGBEMIGA, MD   Assessment & Plan: Visit Diagnoses:  1. Right-sided low back pain with bilateral sciatica, unspecified chronicity   2. Acute bilateral low back pain without sciatica     Plan: Low back pain since May. On-the-job injury in June. MRI scan shows mild bulge L5-S1 without nerve compression and neurologic exam is intact.  Recommend she look for employment she can gradually resume normal activities. If she needs a note releasing her from work on glad to fax 1 for her. I discussed with her that this point no surgery is indicated. She states she was relieved that she doesn't need surgery and really wants to get back to work. I will check her in 2 months and repeat neurologic exam.  Follow-Up Instructions: Return in about 2 months (around 05/27/2016).   Orders:  Orders Placed This Encounter  Procedures  . XR Lumbar Spine 2-3 Views   No orders of the defined types were placed in this encounter.     Procedures: No procedures performed   Clinical Data: No additional findings.   Subjective: Chief Complaint  Patient presents with  . Lower Back - Pain    Ms. Brandi Collins is complaining of pain in right side of her lower back. She states that she injured while at on 10/21/2015.  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  Was in room with resident and he stood up and then he started going down and she she caught him and she twisted while trying to catch him causing a pull sensation, when she turned her head to scream for help is when she noticed the pain.  States that she does have bilateral leg pain with numbness and tingling on the anterior and posterior sides of her legs.    Patient states she was  [facility]. She been working there for 6 months. 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 [employer] she was doing Engineering geologistprivate duty. Patient states that she had been [facility] on May [facility] concerning a back problem. He states she was given an anti-inflammatory at that time. Patient was referred from that ER visit saw Dr. Julien NordmannLangeland on 11/15/2015. Her note also was reviewed.  Review of Systems impervious systems updated as it pertains to her history of present illness. All other systems are negative other than her current low back pain. History of hypertension essential and the hyperactive adult syndrome reviewed.   Objective: Vital Signs: BP 133/87 (BP Location: Left Arm, Patient Position: Sitting)   Pulse 60   Ht 5\' 9"  (1.753 m)   Wt 165 lb (74.8 kg)   BMI 24.37 kg/m   Physical Exam  Constitutional: She is oriented to person, place, and time. She appears well-developed.  HENT:  Head: Normocephalic.  Right Ear: External ear normal.  Left Ear: External ear normal.  Eyes: Pupils are equal, round, and reactive to light.  Cardiovascular: Normal rate.   Pulmonary/Chest: Effort normal.  Abdominal: Soft.  Musculoskeletal:  Patient has normal toe walking and heel walking. Anterior  tib EHL is intact. Knee and ankle jerk are 1+ and symmetrical no pain with hip range of motion negative Pearlean BrownieFaber. Negative straight leg raising to 90.  Neurological: She is alert and oriented to person, place, and time.  Skin: Skin is warm and dry.  Psychiatric: She has a normal mood and affect. Her behavior is normal.    Ortho Exam patient's able ambulate get on and off exam table easily. Negative straight leg raising 90 quads hamstrings are strong hip flexion weakness negative Faber test hip range of motion is a internal/external rotation 45 right and left. Anterior tib EHL is strong tenderness no rash or exposed skin. Extensors posterior tib peroneal are  normal.  Specialty Comments:  No specialty comments available.  Imaging: Xr Lumbar Spine 2-3 Views  Result Date: 03/27/2016 Two-view x-rays lumbar spine are reviewed AP and lateral. This shows slight narrowing L5-S1 disc level. No spondylolisthesis. SI joints and hip joints and pelvis are normal.         Impression: normal 2 view lumbar spine other than slight narrowing of L5-S1    PMFS History: Patient Active Problem List   Diagnosis Date Noted  . Health care maintenance 04/22/2015  . Essential hypertension, benign 04/22/2015  . Annual physical exam 04/28/2013  . Pap smear for cervical cancer screening 04/28/2013  . 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 High blood pressure 04/28/2013   Past Medical History:  Diagnosis Date  . ADHD (attention deficit hyperactivity disorder)     Family History  Problem Relation Age of Onset  . Hypertension Mother   . Heart disease Mother   . Stroke Mother   . Hypertension Daughter     Past Surgical History:  Procedure Laterality Date  . THROAT SURGERY     patient was 50 years old.    Social History   Occupational History  . Not on file.   Social History Main Topics  . Smoking status: Never Smoker  . Smokeless tobacco: Never Used  . Alcohol use No  . Drug use:     Types: Marijuana     Comment: last used marijuana 2 days ago  . Sexual activity: Not on file

## 2016-03-28 ENCOUNTER — Ambulatory Visit: Payer: Self-pay | Attending: Internal Medicine | Admitting: Physical Therapy

## 2016-03-29 ENCOUNTER — Ambulatory Visit (INDEPENDENT_AMBULATORY_CARE_PROVIDER_SITE_OTHER): Payer: Self-pay | Admitting: Orthopaedic Surgery

## 2016-04-04 ENCOUNTER — Ambulatory Visit: Payer: Self-pay | Admitting: Physical Therapy

## 2016-04-24 ENCOUNTER — Ambulatory Visit: Payer: Self-pay

## 2016-05-02 ENCOUNTER — Ambulatory Visit: Payer: Self-pay | Attending: Internal Medicine | Admitting: *Deleted

## 2016-05-02 DIAGNOSIS — Z111 Encounter for screening for respiratory tuberculosis: Secondary | ICD-10-CM

## 2016-05-02 NOTE — Progress Notes (Signed)
PPD skin test administered in right forearm. Pt aware to return within 48 hours at or after 10:00 a.m  And 72 hours during business hours of [facility]. Pt verbalized understanding. Pt given reminder card. Will  receive documentation for employer upon return. Brandi FrancoBenjamin, Tannah Dreyfuss, RN

## 2016-05-04 ENCOUNTER — Ambulatory Visit: Payer: Self-pay

## 2016-05-05 ENCOUNTER — Ambulatory Visit: Payer: Self-pay | Attending: Internal Medicine | Admitting: *Deleted

## 2016-05-05 DIAGNOSIS — Z111 Encounter for screening for respiratory tuberculosis: Secondary | ICD-10-CM | POA: Insufficient documentation

## 2016-05-05 NOTE — Progress Notes (Signed)
Pt arrive for TB reading. Negative reading of PPD. Pt provided paperwork for place of employment. Brandi Collins, Brandi Segall, RN

## 2016-05-30 ENCOUNTER — Ambulatory Visit (INDEPENDENT_AMBULATORY_CARE_PROVIDER_SITE_OTHER): Payer: No Typology Code available for payment source | Admitting: Orthopaedic Surgery

## 2016-06-26 ENCOUNTER — Telehealth: Payer: Self-pay | Admitting: Internal Medicine

## 2016-06-26 NOTE — Telephone Encounter (Signed)
Pt called requesting medication refill on Vitamin D, Ergocalciferol, (DRISDOL) 50000 units CAPS capsule

## 2016-06-26 NOTE — Telephone Encounter (Signed)
This is not a long term medication and we do not refill it. Patient will have to have office visit for vitamin D level and evaluation. Patient informed and verbalized understanding.

## 2016-06-28 ENCOUNTER — Encounter: Payer: Self-pay | Admitting: *Deleted

## 2016-06-28 ENCOUNTER — Encounter: Payer: Self-pay | Admitting: Internal Medicine

## 2016-06-28 ENCOUNTER — Ambulatory Visit: Payer: Self-pay | Attending: Internal Medicine | Admitting: Internal Medicine

## 2016-06-28 VITALS — BP 125/80 | HR 79 | Temp 98.3°F | Resp 16 | Wt 167.2 lb

## 2016-06-28 DIAGNOSIS — E559 Vitamin D deficiency, unspecified: Secondary | ICD-10-CM | POA: Insufficient documentation

## 2016-06-28 DIAGNOSIS — F909 Attention-deficit hyperactivity disorder, unspecified type: Secondary | ICD-10-CM | POA: Insufficient documentation

## 2016-06-28 DIAGNOSIS — Z0000 Encounter for general adult medical examination without abnormal findings: Secondary | ICD-10-CM | POA: Insufficient documentation

## 2016-06-28 DIAGNOSIS — K029 Dental caries, unspecified: Secondary | ICD-10-CM | POA: Insufficient documentation

## 2016-06-28 DIAGNOSIS — Z1211 Encounter for screening for malignant neoplasm of colon: Secondary | ICD-10-CM

## 2016-06-28 LAB — CBC WITH DIFFERENTIAL/PLATELET
BASOS PCT: 0 %
Basophils Absolute: 0 cells/uL (ref 0–200)
Eosinophils Absolute: 0 cells/uL — ABNORMAL LOW (ref 15–500)
Eosinophils Relative: 0 %
HCT: 38.8 % (ref 35.0–45.0)
Hemoglobin: 12.6 g/dL (ref 11.7–15.5)
LYMPHS ABS: 2275 {cells}/uL (ref 850–3900)
Lymphocytes Relative: 35 %
MCH: 29.1 pg (ref 27.0–33.0)
MCHC: 32.5 g/dL (ref 32.0–36.0)
MCV: 89.6 fL (ref 80.0–100.0)
MONOS PCT: 8 %
MPV: 11.3 fL (ref 7.5–12.5)
Monocytes Absolute: 520 cells/uL (ref 200–950)
Neutro Abs: 3705 cells/uL (ref 1500–7800)
Neutrophils Relative %: 57 %
PLATELETS: 267 10*3/uL (ref 140–400)
RBC: 4.33 MIL/uL (ref 3.80–5.10)
RDW: 15 % (ref 11.0–15.0)
WBC: 6.5 10*3/uL (ref 3.8–10.8)

## 2016-06-28 LAB — BASIC METABOLIC PANEL WITH GFR
BUN: 11 mg/dL (ref 7–25)
CHLORIDE: 108 mmol/L (ref 98–110)
CO2: 27 mmol/L (ref 20–31)
Calcium: 9.5 mg/dL (ref 8.6–10.4)
Creat: 1.28 mg/dL — ABNORMAL HIGH (ref 0.50–1.05)
GFR, EST AFRICAN AMERICAN: 56 mL/min — AB (ref >=60)
GFR, EST NON AFRICAN AMERICAN: 49 mL/min — AB (ref >=60)
Glucose, Bld: 202 mg/dL — ABNORMAL HIGH (ref 65–99)
POTASSIUM: 3.6 mmol/L (ref 3.5–5.3)
Sodium: 144 mmol/L (ref 135–146)

## 2016-06-28 NOTE — Progress Notes (Signed)
Pt is needing a refill on the Vit D 50,000 unit capsule

## 2016-06-28 NOTE — Patient Instructions (Addendum)
Calcium 1241m /daily Vit D - ?? Will check   [facility] 230pm [facility] 4pm [facility]  -   Colonoscopy, Adult A colonoscopy is an exam to look at the large intestine. It is done to check for problems, such as:  Lumps (tumors).  Growths (polyps).  Swelling (inflammation).  Bleeding. What happens before the procedure? Eating and drinking Follow instructions from your doctor about eating and drinking. These instructions may include:  A few days before the procedure - follow a low-fiber diet.  Avoid nuts.  Avoid seeds.  Avoid dried fruit.  Avoid raw fruits.  Avoid vegetables.  1-3 days before the procedure - follow a clear liquid diet. Avoid liquids that have red or purple dye. Drink only clear liquids, such as:  Clear broth or bouillon.  Black coffee or tea.  Clear juice.  Clear soft drinks or sports drinks.  [place] desert.  Popsicles.  On the day of the procedure - do not eat or drink anything during the 2 hours before the procedure. Bowel prep If you were prescribed an oral bowel prep:  Take it as told by your doctor. Starting the day before your procedure, you will need to drink a lot of liquid. The liquid will cause you to poop (have bowel movements) until your poop is almost clear or light green.  If your skin or butt gets irritated from diarrhea, you may:  Wipe the area with wipes that have medicine in them, such as adult wet wipes with aloe and vitamin E.  Put something on your skin that soothes the area, such as petroleum jelly.  If you throw up (vomit) while drinking the bowel prep, take a break for up to 60 minutes. Then begin the bowel prep again. If you keep throwing up and you cannot take the bowel prep without throwing up, call your doctor. General instructions  Ask your doctor about changing or stopping your normal medicines. This is important if you take diabetes medicines or blood thinners.  Plan to have  someone take you home from the hospital or clinic. What happens during the procedure?  An IV tube may be put into one of your veins.  You will be given medicine to help you relax (sedative).  To reduce your risk of infection:  Your doctors will wash their hands.  Your anal area will be washed with soap.  You will be asked to lie on your side with your knees bent.  Your doctor will get a long, thin, flexible tube ready. The tube will have a camera and a light on the end.  The tube will be put into your anus.  The tube will be gently put into your large intestine.  Air will be delivered into your large intestine to keep it open. You may feel some pressure or cramping.  The camera will be used to take photos.  A small tissue sample may be removed from your body to be looked at under a microscope (biopsy). If any possible problems are found, the tissue will be sent to a lab for testing.  If small growths are found, your doctor may remove them and have them checked for cancer.  The tube that was put into your anus will be slowly removed. The procedure may vary among doctors and hospitals. What happens after the procedure?  Your doctor will check on you often until the medicines you were given have worn off.  Do not drive  for 24 hours after the procedure.  You may have a small amount of blood in your poop.  You may pass gas.  You may have mild cramps or bloating in your belly (abdomen).  It is up to you to get the results of your procedure. Ask your doctor, or the department performing the procedure, when your results will be ready. This information is not intended to replace advice given to you by your health care provider. Make sure you discuss any questions you have with your health care provider. Document Released: 06/10/2010 Document Revised: 01/13/2016 Document Reviewed: 07/20/2015 Elsevier Interactive Patient Education  2017 [place] Maintenance for  Postmenopausal Women Introduction Menopause is a normal process in which your reproductive ability comes to an end. This process happens gradually over a span of months to years, usually between the ages of 28 and 52. Menopause is complete when you have missed 12 consecutive menstrual periods. It is important to talk with your health care provider about some of the most common conditions that affect postmenopausal women, such as heart disease, cancer, and bone loss (osteoporosis). Adopting a healthy lifestyle and getting preventive care can help to promote your health and wellness. Those actions can also lower your chances of developing some of these common conditions. What should I know about menopause? During menopause, you may experience a number of symptoms, such as:  Moderate-to-severe hot flashes.  Night sweats.  Decrease in sex drive.  Mood swings.  Headaches.  Tiredness.  Irritability.  Memory problems.  Insomnia. Choosing to treat or not to treat menopausal changes is an individual decision that you make with your health care provider. What should I know about hormone replacement therapy and supplements? Hormone therapy products are effective for treating symptoms that are associated with menopause, such as hot flashes and night sweats. Hormone replacement carries certain risks, especially as you become older. If you are thinking about using estrogen or estrogen with progestin treatments, discuss the benefits and risks with your health care provider. What should I know about heart disease and stroke? Heart disease, heart attack, and stroke become more likely as you age. This may be due, in part, to the hormonal changes that your body experiences during menopause. These can affect how your body processes dietary fats, triglycerides, and cholesterol. Heart attack and stroke are both medical emergencies. There are many things that you can do to help prevent heart disease and  stroke:  Have your blood pressure checked at least every 1-2 years. High blood pressure causes heart disease and increases the risk of stroke.  If you are 42-89 years old, ask your health care provider if you should take aspirin to prevent a heart attack or a stroke.  Do not use any tobacco products, including cigarettes, chewing tobacco, or electronic cigarettes. If you need help quitting, ask your health care provider.  It is important to eat a healthy diet and maintain a healthy weight.  Be sure to include plenty of vegetables, fruits, low-fat dairy products, and lean protein.  Avoid eating foods that are high in solid fats, added sugars, or salt (sodium).  Get regular exercise. This is one of the most important things that you can do for your health.  Try to exercise for at least 150 minutes each week. The type of exercise that you do should increase your heart rate and make you sweat. This is known as moderate-intensity exercise.  Try to do strengthening exercises at least twice each  week. Do these in addition to the moderate-intensity exercise.  Know your numbers.Ask your health care provider to check your cholesterol and your blood glucose. Continue to have your blood tested as directed by your health care provider. What should I know about cancer screening? There are several types of cancer. Take the following steps to reduce your risk and to catch any cancer development as early as possible. Breast Cancer  Practice breast self-awareness.  This means understanding how your breasts normally appear and feel.  It also means doing regular breast self-exams. Let your health care provider know about any changes, no matter how small.  If you are 54 or older, have a clinician do a breast exam (clinical breast exam or CBE) every year. Depending on your age, family history, and medical history, it may be recommended that you also have a yearly breast X-ray (mammogram).  If you have a  family history of breast cancer, talk with your health care provider about genetic screening.  If you are at high risk for breast cancer, talk with your health care provider about having an MRI and a mammogram every year.  Breast cancer (BRCA) gene test is recommended for women who have family members with BRCA-related cancers. Results of the assessment will determine the need for genetic counseling and BRCA1 and for BRCA2 testing. BRCA-related cancers include these types:  Breast. This occurs in males or females.  Ovarian.  Tubal. This may also be called fallopian tube cancer.  Cancer of the abdominal or pelvic lining (peritoneal cancer).  Prostate.  Pancreatic. Cervical, Uterine, and Ovarian Cancer  Your health care provider may recommend that you be screened regularly for cancer of the pelvic organs. These include your ovaries, uterus, and vagina. This screening involves a pelvic exam, which includes checking for microscopic changes to the surface of your cervix (Pap test).  For women ages 21-65, health care providers may recommend a pelvic exam and a Pap test every three years. For women ages 62-65, they may recommend the Pap test and pelvic exam, combined with testing for human papilloma virus (HPV), every five years. Some types of HPV increase your risk of cervical cancer. Testing for HPV may also be done on women of any age who have unclear Pap test results.  Other health care providers may not recommend any screening for nonpregnant women who are considered low risk for pelvic cancer and have no symptoms. Ask your health care provider if a screening pelvic exam is right for you.  If you have had past treatment for cervical cancer or a condition that could lead to cancer, you need Pap tests and screening for cancer for at least 20 years after your treatment. If Pap tests have been discontinued for you, your risk factors (such as having a new sexual partner) need to be reassessed to  determine if you should start having screenings again. Some women have medical problems that increase the chance of getting cervical cancer. In these cases, your health care provider may recommend that you have screening and Pap tests more often.  If you have a family history of uterine cancer or ovarian cancer, talk with your health care provider about genetic screening.  If you have vaginal bleeding after reaching menopause, tell your health care provider.  There are currently no reliable tests available to screen for ovarian cancer. Lung Cancer  Lung cancer screening is recommended for adults 85-8 years old who are at high risk for lung cancer because of a history  of smoking. A yearly low-dose CT scan of the lungs is recommended if you:  Currently smoke.  Have a history of at least 30 pack-years of smoking and you currently smoke or have quit within the past 15 years. A pack-year is smoking an average of one pack of cigarettes per day for one year. Yearly screening should:  Continue until it has been 15 years since you quit.  Stop if you develop a health problem that would prevent you from having lung cancer treatment. Colorectal Cancer  This type of cancer can be detected and can often be prevented.  Routine colorectal cancer screening usually begins at age 71 and continues through age 65.  If you have risk factors for colon cancer, your health care provider may recommend that you be screened at an earlier age.  If you have a family history of colorectal cancer, talk with your health care provider about genetic screening.  Your health care provider may also recommend using home test kits to check for hidden blood in your stool.  A small camera at the end of a tube can be used to examine your colon directly (sigmoidoscopy or colonoscopy). This is done to check for the earliest forms of colorectal cancer.  Direct examination of the colon should be repeated every 5-10 years until  age 44. However, if early forms of precancerous polyps or small growths are found or if you have a family history or genetic risk for colorectal cancer, you may need to be screened more often. Skin Cancer  Check your skin from head to toe regularly.  Monitor any moles. Be sure to tell your health care provider:  About any new moles or changes in moles, especially if there is a change in a mole's shape or color.  If you have a mole that is larger than the size of a pencil eraser.  If any of your family members has a history of skin cancer, especially at a young age, talk with your health care provider about genetic screening.  Always use sunscreen. Apply sunscreen liberally and repeatedly throughout the day.  Whenever you are outside, protect yourself by wearing long sleeves, pants, a wide-brimmed hat, and sunglasses. What should I know about osteoporosis? Osteoporosis is a condition in which bone destruction happens more quickly than new bone creation. After menopause, you may be at an increased risk for osteoporosis. To help prevent osteoporosis or the bone fractures that can happen because of osteoporosis, the following is recommended:  If you are 61-82 years old, get at least 1,000 mg of calcium and at least 600 mg of vitamin D per day.  If you are older than age 36 but younger than age 64, get at least 1,200 mg of calcium and at least 600 mg of vitamin D per day.  If you are older than age 87, get at least 1,200 mg of calcium and at least 800 mg of vitamin D per day. Smoking and excessive alcohol intake increase the risk of osteoporosis. Eat foods that are rich in calcium and vitamin D, and do weight-bearing exercises several times each week as directed by your health care provider. What should I know about how menopause affects my mental health? Depression may occur at any age, but it is more common as you become older. Common symptoms of depression include:  Low or sad  mood.  Changes in sleep patterns.  Changes in appetite or eating patterns.  Feeling an overall lack of motivation or enjoyment of  activities that you previously enjoyed.  Frequent crying spells. Talk with your health care provider if you think that you are experiencing depression. What should I know about immunizations? It is important that you get and maintain your immunizations. These include:  Tetanus, diphtheria, and pertussis (Tdap) booster vaccine.  Influenza every year before the flu season begins.  Pneumonia vaccine.  Shingles vaccine. Your health care provider may also recommend other immunizations. This information is not intended to replace advice given to you by your health care provider. Make sure you discuss any questions you have with your health care provider. Document Released: 06/30/2005 Document Revised: 11/26/2015 Document Reviewed: 02/09/2015  2017 Elsevier

## 2016-06-28 NOTE — Progress Notes (Signed)
Brandi Collins, is a 51 y.o. female  FAO:[id number]  [id number]  DOB - 01/31/1966  Chief Complaint  Patient presents with  . Medication Refill        Subjective:   Brandi Collins is a 51 y.o. female here today for a follow up visit. Doing well, no c/o, eating healthy, and working again since cleared by her ortho MD.   She has insurance now, and interested colonoscopy.  Of note, she never started otc for vit d after finished rx, she did not know she had to.  Patient has No headache, No chest pain, No abdominal pain - No Nausea, No new weakness tingling or numbness, No Cough - SOB.  No problems updated.  ALLERGIES: No Known Allergies  PAST MEDICAL HISTORY: Past Medical History:  Diagnosis Date  . ADHD (attention deficit hyperactivity disorder)     MEDICATIONS AT HOME: Prior to Admission medications   Medication Sig Start Date End Date Taking? Authorizing Provider  carbamide peroxide (DEBROX) 6.5 % otic solution Place 5 drops into both ears 2 (two) times daily. Patient not taking: Reported on 03/27/2016 03/14/16   Pete Glatterawn T Langeland, MD  cloNIDine (CATAPRES) 0.1 MG tablet Take 1 tablet (0.1 mg total) by mouth 2 (two) times daily. Patient not taking: Reported on 03/27/2016 12/26/15   Joni ReiningNicole Pisciotta, PA-C  cyclobenzaprine (FLEXERIL) 5 MG tablet Take 1 tablet (5 mg total) by mouth 3 (three) times daily as needed for muscle spasms. Patient not taking: Reported on 06/28/2016 02/02/16   Arthor CaptainAbigail Harris, PA-C  diclofenac sodium (VOLTAREN) 1 % GEL Apply 2 g topically 4 (four) times daily. Patient not taking: Reported on 06/28/2016 12/26/15   Joni ReiningNicole Pisciotta, PA-C  metroNIDAZOLE (FLAGYL) 500 MG tablet Take 1 tablet (500 mg total) by mouth 2 (two) times daily. Patient not taking: Reported on 06/28/2016 03/20/16   Pete Glatterawn T Langeland, MD  naproxen (NAPROSYN) 250 MG tablet Take 1 tablet (250 mg total) by mouth 2 (two) times daily with a meal. Patient not taking: Reported on 03/27/2016 01/30/16    Everlene FarrierWilliam Dansie, PA-C  oxyCODONE-acetaminophen (PERCOCET) 5-325 MG tablet Take 1 tablet by mouth every 4 (four) hours as needed for moderate pain. Patient not taking: Reported on 03/27/2016 11/14/15   Dione Boozeavid Glick, MD  predniSONE (DELTASONE) 20 MG tablet Take 2 tablets (40 mg total) by mouth daily. Patient not taking: Reported on 03/27/2016 01/30/16   Everlene FarrierWilliam Dansie, PA-C  tobramycin (TOBREX) 0.3 % ophthalmic ointment Place 1 application into both eyes 4 (four) times daily. Patient not taking: Reported on 03/27/2016 08/10/15   Hayden Rasmussenavid Mabe, NP  Vitamin D, Ergocalciferol, (DRISDOL) 50000 units CAPS capsule Take 1 capsule (50,000 Units total) by mouth every 7 (seven) days. Patient not taking: Reported on 06/28/2016 03/15/16   Pete Glatterawn T Langeland, MD     Objective:   Vitals:   06/28/16 1026  BP: 125/80  Pulse: 79  Resp: 16  Temp: 98.3 F (36.8 C)  TempSrc: Oral  SpO2: 95%  Weight: 167 lb 3.2 oz (75.8 kg)    Exam General appearance : Awake, alert, not in any distress. Speech Clear. Not toxic looking, very pleasant, happy spirits. HEENT: Atraumatic and Normocephalic, pupils equally reactive to light. Neck: supple, no JVD.  Chest:Good air entry bilaterally, no added sounds. CVS: S1 S2 regular, no murmurs/gallups or rubs. Abdomen: Bowel sounds active, Non tender and not distended with no gaurding, rigidity or rebound. Extremities: B/L Lower Ext shows no edema, both legs are warm to touch Neurology: Awake alert,  and oriented X 3, CN II-XII grossly intact, Non focal Skin:No Rash  Data Review No results found for: HGBA1C  Depression screen [facility] 2/9 03/14/2016 12/06/2015 11/15/2015 04/22/2015 04/28/2013  Decreased Interest 1 0 0 3 0  Down, Depressed, Hopeless 0 0 0 1 0  PHQ - 2 Score 1 0 0 4 0  Altered sleeping - - - 2 -  Tired, decreased energy - - - 1 -  Change in appetite - - - 3 -  Feeling bad or failure about yourself  - - - 1 -  Trouble concentrating - - - 1 -  Moving slowly or  fidgety/restless - - - 1 -  Suicidal thoughts - - - 1 -  PHQ-9 Score - - - 14 -      Assessment & Plan   1. Vitamin D deficiency Encouraged  Calcium 1200mg /daily - will rechk vit d, if very low, will rx, if not otc recd. - VITAMIN D 25 Hydroxy (Vit-D Deficiency, Fractures)  2. Health maintenance examination - CBC with Differential - BASIC METABOLIC PANEL WITH GFR - uptodate w/ MM, repeat q 2 years.  3. Dental cavities - Ambulatory referral to Dentistry  4. Colon cancer screening amb ref gi      Patient have been counseled extensively about nutrition and exercise  Return in about 6 months (around 12/26/2016), or if symptoms worsen or fail to improve.  The patient was given clear instructions to go to ER or return to medical center if symptoms don't improve, worsen or new problems develop. The patient verbalized understanding. The patient was told to call to get lab results if they haven't heard anything in the next week.   This note has been created with Education officer, environmental. Any transcriptional errors are unintentional.   Pete Glatter, MD, MBA/MHA [facility] and [facility] [place], [place] [phone]   06/28/2016, 1:11 PM

## 2016-06-29 LAB — VITAMIN D 25 HYDROXY (VIT D DEFICIENCY, FRACTURES): VIT D 25 HYDROXY: 38 ng/mL (ref 30–100)

## 2016-07-03 ENCOUNTER — Other Ambulatory Visit: Payer: Self-pay | Admitting: Internal Medicine

## 2016-07-03 ENCOUNTER — Encounter: Payer: Self-pay | Admitting: Gastroenterology

## 2016-08-16 ENCOUNTER — Ambulatory Visit: Payer: Self-pay | Attending: Internal Medicine

## 2016-08-17 ENCOUNTER — Encounter: Payer: Self-pay | Admitting: *Deleted

## 2016-08-17 ENCOUNTER — Telehealth: Payer: Self-pay | Admitting: *Deleted

## 2016-08-17 NOTE — Telephone Encounter (Signed)
Patient was a NOS. Could not leave message. A person answered and when I asked for the patient, she told me that I had the wrong number.

## 2016-08-31 ENCOUNTER — Encounter: Payer: Self-pay | Admitting: Gastroenterology

## 2016-12-17 ENCOUNTER — Ambulatory Visit (HOSPITAL_COMMUNITY)
Admission: EM | Admit: 2016-12-17 | Discharge: 2016-12-17 | Disposition: A | Discharge status: Home / Self Care | Payer: Self-pay | Attending: Family Medicine | Admitting: Family Medicine

## 2016-12-17 ENCOUNTER — Encounter (HOSPITAL_COMMUNITY): Payer: Self-pay | Admitting: Emergency Medicine

## 2016-12-17 DIAGNOSIS — J069 Acute upper respiratory infection, unspecified: Secondary | ICD-10-CM

## 2016-12-17 DIAGNOSIS — B9789 Other viral agents as the cause of diseases classified elsewhere: Secondary | ICD-10-CM

## 2016-12-17 MED ORDER — FLUTICASONE PROPIONATE 50 MCG/ACT NA SUSP: 1.0000 | Freq: Every day | NASAL | 0 refills | Status: DC

## 2016-12-17 MED ORDER — GUAIFENESIN 100 MG/5ML PO LIQD: 100.0000 mg | ORAL | 0 refills | Status: DC | PRN

## 2016-12-17 NOTE — ED Triage Notes (Signed)
[facility] a [facility] and nasal congestion x 1 day.

## 2016-12-17 NOTE — Discharge Instructions (Signed)
You have a viral URI with cough. For this please do the following:  1. Drink plenty of fluids. Hot tea, soup etc will help open your nasal passages. 2. Guaifenesin (plain Robitussin) for cough suppression 3. Tylenol for pain up to 1000 mg three times daily.  4. Nasal saline-OTC nose spray for congestion.   Seek care for chest pain, shortness of breath, persistent high fever.

## 2016-12-17 NOTE — ED Provider Notes (Signed)
[facility]    CSN: [id number] Arrival date & time: 12/17/16  1229     History   Chief Complaint Chief Complaint  Patient presents with  . Cough  . Nasal Congestion    HPI Jonnie Finnerawanna A Poss is a 51 y.o. female.   HPI  51 year old female nonsmoker presents with 1 day history of sneezing followed by dry cough, nasal and chest congestion, and sore throat with coughing. She denies fever and chills. She's not taking any medication for symptoms. 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  Past Medical History:  Diagnosis Date  . ADHD (attention deficit hyperactivity disorder)     Patient Active Problem List   Diagnosis Date Noted  . Screening examination for pulmonary tuberculosis 05/05/2016  . Acute bilateral low back pain without sciatica 03/27/2016  . Health care maintenance 04/22/2015  . Essential hypertension, benign 04/22/2015  . Annual physical exam 04/28/2013  . Pap smear for cervical cancer screening 04/28/2013  . 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 High blood pressure 04/28/2013    Past Surgical History:  Procedure Laterality Date  . THROAT SURGERY     patient was 51 years old.     OB History    No data available       Home Medications    Prior to Admission medications   Not on File    Family History Family History  Problem Relation Age of Onset  . Hypertension Mother   . Heart disease Mother   . Stroke Mother   . Hypertension Daughter     Social History Social History  Substance Use Topics  . Smoking status: Never Smoker  . Smokeless tobacco: Never Used  . Alcohol use No     Allergies   Patient has no known allergies.   Review of Systems Review of Systems  Constitutional: Negative for chills and fever.  HENT: Positive for sneezing and sore throat. Negative for ear pain.   Eyes: Negative for visual disturbance.  Respiratory: Positive for  cough. Negative for shortness of breath.   Cardiovascular: Negative for chest pain.  Gastrointestinal: Negative for abdominal pain and blood in stool.  Musculoskeletal: Negative for arthralgias and back pain.  Skin: Negative for rash.  Allergic/Immunologic: Negative for immunocompromised state.  Neurological: Negative for headaches.  Hematological: Negative for adenopathy. Does not bruise/bleed easily.  Psychiatric/Behavioral: Negative for dysphoric mood and suicidal ideas.     Physical Exam Triage Vital Signs ED Triage Vitals  Enc Vitals Group     BP 12/17/16 1307 125/76     Pulse Rate 12/17/16 1307 79     Resp 12/17/16 1307 18     Temp 12/17/16 1307 98.6 F (37 C)     Temp Source 12/17/16 1307 Oral     SpO2 12/17/16 1307 96 %     Weight --      Height --      Head Circumference --      Peak Flow --      Pain Score 12/17/16 1305 5     Pain Loc --      Pain Edu? --      Excl. in GC? --    No data found.   Updated Vital Signs BP 125/76 (BP Location: Right Arm)   Pulse 79   Temp 98.6 F (37 C) (Oral)   Resp 18   SpO2 96%   Visual  Acuity Right Eye Distance:   Left Eye Distance:   Bilateral Distance:    Right Eye Near:   Left Eye Near:    Bilateral Near:     Physical Exam  HENT:  Right Ear: Tympanic membrane and external ear normal.  Left Ear: Tympanic membrane and external ear normal.  Nose: Mucosal edema present. Right sinus exhibits no maxillary sinus tenderness and no frontal sinus tenderness. Left sinus exhibits no maxillary sinus tenderness and no frontal sinus tenderness.  Mouth/Throat: Oropharynx is clear and moist. No oropharyngeal exudate, posterior oropharyngeal edema, posterior oropharyngeal erythema or tonsillar abscesses.  Dried cerumen in both canals   Neck: Normal range of motion.  Lymphadenopathy:    She has no cervical adenopathy.     UC Treatments / Results  Labs (all labs ordered are listed, but only abnormal results are  displayed) Labs Reviewed - No data to display  EKG  EKG Interpretation None       Radiology No results found.  Procedures Procedures (including critical care time)  Medications Ordered in UC Medications - No data to display   Initial Impression / Assessment and Plan / UC Course  I have reviewed the triage vital signs and the nursing notes.  Pertinent labs & imaging results that were available during my care of the patient were reviewed by me and considered in my medical decision making (see chart for details).     Final Clinical Impressions(s) / UC Diagnoses   Final diagnoses:  Viral URI with cough    New Prescriptions New Prescriptions   FLUTICASONE (FLONASE) 50 MCG/ACT NASAL SPRAY    Place 1 spray into both nostrils daily.   GUAIFENESIN (ROBITUSSIN) 100 MG/5ML LIQUID    Take 5-10 mLs (100-200 mg total) by mouth every 4 (four) hours as needed for cough.     Dessa PhiFunches, Brittnee Gaetano, MD 12/17/16 1436

## 2017-08-27 ENCOUNTER — Ambulatory Visit (HOSPITAL_COMMUNITY)
Admission: EM | Admit: 2017-08-27 | Discharge: 2017-08-27 | Disposition: A | Discharge status: Home / Self Care | Payer: BLUE CROSS/BLUE SHIELD | Attending: Internal Medicine | Admitting: Internal Medicine

## 2017-08-27 ENCOUNTER — Encounter (HOSPITAL_COMMUNITY): Payer: Self-pay | Admitting: Emergency Medicine

## 2017-08-27 DIAGNOSIS — M545 Low back pain: Secondary | ICD-10-CM

## 2017-08-27 DIAGNOSIS — M5441 Lumbago with sciatica, right side: Secondary | ICD-10-CM

## 2017-08-27 MED ORDER — PREDNISONE 50 MG PO TABS: 50.0000 mg | ORAL_TABLET | Freq: Every day | ORAL | 0 refills | Status: AC

## 2017-08-27 MED ORDER — METHYLPREDNISOLONE SODIUM SUCC 125 MG IJ SOLR
80.0000 mg | Freq: Once | INTRAMUSCULAR | Status: AC
  Administered 2017-08-27: 80 mg via INTRAMUSCULAR

## 2017-08-27 MED ORDER — METHOCARBAMOL 500 MG PO TABS: 500.0000 mg | ORAL_TABLET | Freq: Three times a day (TID) | ORAL | 0 refills | Status: AC | PRN

## 2017-08-27 MED ORDER — METHYLPREDNISOLONE SODIUM SUCC 125 MG IJ SOLR
INTRAMUSCULAR | Status: AC
  Filled 2017-08-27: qty 2

## 2017-08-27 NOTE — Discharge Instructions (Addendum)
No danger signs on exam today.  Injection of Solu-Medrol, steroid, was given at the urgent care.  Prescriptions for prednisone and methocarbamol (muscle relaxer) were sent to the pharmacy.  You can continue taking Aleve with these medicines.  Follow-up with Dr. [facility] later this week as planned.  Note for work today.

## 2017-08-27 NOTE — ED Triage Notes (Signed)
Pt sts cyst to lower back that is painful; pt sts hx of same

## 2017-08-27 NOTE — ED Provider Notes (Signed)
[facility]    CSN: [id number] Arrival date & time: 08/27/17  1123     History   Chief Complaint Chief Complaint  Patient presents with  . Cyst    HPI Brandi Collins is a 52 y.o. female.   She presents today with flareup of right low back pain and right sciatica, had to call out of work today.  This has been managed intermittently in the past, typically with some combination of anti-inflammatories, steroids, muscle relaxers, rarely a short course of opiates.  Takes Aleve regularly. She presents today with pain increased for the last 2 days, pain is in her right low back and radiates down the leg.  No weakness or clumsiness in the legs, no change in bowel or bladder function, no loss of sensation.  She actually has an appointment with her PCP this week.   HPI  Past Medical History:  Diagnosis Date  . ADHD (attention deficit hyperactivity disorder)     Patient Active Problem List   Diagnosis Date Noted  . Screening examination for pulmonary tuberculosis 05/05/2016  . Acute bilateral low back pain without sciatica 03/27/2016  . Health care maintenance 04/22/2015  . Essential hypertension, benign 04/22/2015  . Annual physical exam 04/28/2013  . Pap smear for cervical cancer screening 04/28/2013  . 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 High blood pressure 04/28/2013    Past Surgical History:  Procedure Laterality Date  . THROAT SURGERY     patient was 52 years old.     Home Medications    Prior to Admission medications   Medication Sig Start Date End Date Taking? Authorizing Provider  fluticasone (FLONASE) 50 MCG/ACT nasal spray Place 1 spray into both nostrils daily. 12/17/16   Funches, Gerilyn NestleJosalyn, MD  guaiFENesin (ROBITUSSIN) 100 MG/5ML liquid Take 5-10 mLs (100-200 mg total) by mouth every 4 (four) hours as needed for cough. 12/17/16   Funches, Gerilyn NestleJosalyn, MD  methocarbamol (ROBAXIN) 500 MG tablet Take 1 tablet (500 mg total) by mouth 3 (three) times daily as  needed for up to 7 days for muscle spasms. 08/27/17 09/03/17  Isa RankinMurray, Quran Vasco Wilson, MD  predniSONE (DELTASONE) 50 MG tablet Take 1 tablet (50 mg total) by mouth daily for 5 days. 08/27/17 09/01/17  Isa RankinMurray, Tzion Wedel Wilson, MD    Family History Family History  Problem Relation Age of Onset  . Hypertension Mother   . Heart disease Mother   . Stroke Mother   . Hypertension Daughter     Social History Social History   Tobacco Use  . Smoking status: Never Smoker  . Smokeless tobacco: Never Used  Substance Use Topics  . Alcohol use: No  . Drug use: Yes    Types: Marijuana    Comment: last used marijuana 2 days ago     Allergies   Patient has no known allergies.   Review of Systems Review of Systems  All other systems reviewed and are negative.    Physical Exam Triage Vital Signs ED Triage Vitals [08/27/17 1147]  Enc Vitals Group     BP 118/69     Pulse Rate 82     Resp 18     Temp 98.3 F (36.8 C)     Temp Source Oral     SpO2 96 %     Weight      Height      Pain Score      Pain Loc    Updated Vital Signs BP 118/69 (BP Location: Left  Arm)   Pulse 82   Temp 98.3 F (36.8 C) (Oral)   Resp 18   SpO2 96%  Physical Exam  Constitutional: She is oriented to person, place, and time. No distress.  HENT:  Head: Atraumatic.  Eyes:  Conjugate gaze observed, no eye redness/discharge  Neck: Neck supple.  Cardiovascular: Normal rate.  Pulmonary/Chest: No respiratory distress.  Abdominal: She exhibits no distension.  Musculoskeletal: Normal range of motion.       Arms: No focal tenderness or spasm in the low back, no rash, no bruising, no focal swelling.  Discomfort is concentrated in the right lateral low back as in the diagram. Patient was able to walk into the urgent care independently, and climb on/off the exam table without difficulty.  Neurological: She is alert and oriented to person, place, and time.  Skin: Skin is warm and dry.  Nursing note and vitals  reviewed.    UC Treatments / Results   EKG None Radiology No results found.  Procedures Procedures (including critical care time)  Medications Ordered in UC Medications  methylPREDNISolone sodium succinate (SOLU-MEDROL) 125 mg/2 mL injection 80 mg (80 mg Intramuscular Given 08/27/17 1226)     Final Clinical Impressions(s) / UC Diagnoses   Final diagnoses:  Right-sided low back pain with right-sided sciatica, unspecified chronicity   No danger signs on exam today.  Injection of Solu-Medrol, steroid, was given at the urgent care.  Prescriptions for prednisone and methocarbamol (muscle relaxer) were sent to the pharmacy.  You can continue taking Aleve with these medicines.  Follow-up with Dr. [facility] later this week as planned.  Note for work today.  ED Discharge Orders        Ordered    predniSONE (DELTASONE) 50 MG tablet  Daily     08/27/17 1206    methocarbamol (ROBAXIN) 500 MG tablet  3 times daily PRN     08/27/17 1206         Isa Rankin, MD 08/27/17 2035

## 2017-09-20 ENCOUNTER — Encounter (HOSPITAL_COMMUNITY): Payer: Self-pay | Admitting: Family Medicine

## 2017-09-20 ENCOUNTER — Ambulatory Visit (HOSPITAL_COMMUNITY)
Admission: EM | Admit: 2017-09-20 | Discharge: 2017-09-20 | Disposition: A | Discharge status: Home / Self Care | Payer: BLUE CROSS/BLUE SHIELD | Attending: Family Medicine | Admitting: Family Medicine

## 2017-09-20 DIAGNOSIS — R0981 Nasal congestion: Secondary | ICD-10-CM

## 2017-09-20 DIAGNOSIS — H5789 Other specified disorders of eye and adnexa: Secondary | ICD-10-CM

## 2017-09-20 MED ORDER — POVIDONE-IODINE 10 % EX SOLN
CUTANEOUS | Status: AC
  Filled 2017-09-20: qty 118

## 2017-09-20 MED ORDER — FLUTICASONE PROPIONATE 50 MCG/ACT NA SUSP: 1.0000 | Freq: Every day | NASAL | 2 refills | Status: DC

## 2017-09-20 NOTE — Discharge Instructions (Signed)
You may start using Claritin daily for at least the next 2 weeks.

## 2017-09-20 NOTE — ED Triage Notes (Signed)
Pt here for runny nose congestion and headache since yesterday.

## 2017-09-20 NOTE — ED Provider Notes (Signed)
[facility]   [id number] 09/20/17 Arrival Time: 1115  ASSESSMENT & PLAN:  1. Nasal congestion   2. Irritation of eye    Question viral illness vs seasonal allergies.  Meds ordered this encounter  Medications  . fluticasone (FLONASE) 50 MCG/ACT nasal spray    Sig: Place 1 spray into both nostrils daily.    Dispense:  16 g    Refill:  2   Will try OTC Claritin for at least 2 weeks. Aleve when needed.  May f/u with PCP or here as needed.  Reviewed expectations re: course of current medical issues. Questions answered. Outlined signs and symptoms indicating need for more acute intervention. Patient verbalized understanding. After Visit Summary given.   SUBJECTIVE: History from: patient.  Brandi Collins is a 52 y.o. female who presents with complaint of nasal congestion, post-nasal drainage, and a persistent dry cough. Onset abrupt, approximately 1 day ago. SOB: none. Wheezing: none. Fever: no. Overall normal PO intake without n/v. Sick contacts: no. OTC treatment: none. Also mild frontal headache.   Social History   Tobacco Use  Smoking Status Never Smoker  Smokeless Tobacco Never Used    ROS: As per HPI.   OBJECTIVE: General appearance: alert; appears fatigued HEENT: nasal congestion; clear runny nose; throat irritation secondary to post-nasal drainage Neck: supple without LAD Lungs: unlabored respirations, symmetrical air entry; cough: absent; no respiratory distress Skin: warm and dry Psychological: alert and cooperative; normal mood and affect  No Known Allergies  Past Medical History:  Diagnosis Date  . ADHD (attention deficit hyperactivity disorder)    Family History  Problem Relation Age of Onset  . Hypertension Mother   . Heart disease Mother   . Stroke Mother   . Hypertension Daughter    Social History   Socioeconomic History  . Marital status: Single    Spouse name: Not on file  . Number of children: Not on file  . Years of  education: Not on file  . Highest education level: Not on file  Occupational History  . Not on file  Social Needs  . Financial resource strain: Not on file  . Food insecurity:    Worry: Not on file    Inability: Not on file  . Transportation needs:    Medical: Not on file    Non-medical: Not on file  Tobacco Use  . Smoking status: Never Smoker  . Smokeless tobacco: Never Used  Substance and Sexual Activity  . Alcohol use: No  . Drug use: Yes    Types: Marijuana    Comment: last used marijuana 2 days ago  . Sexual activity: Not on file  Lifestyle  . Physical activity:    Days per week: Not on file    Minutes per session: Not on file  . Stress: Not on file  Relationships  . Social connections:    Talks on phone: Not on file    Gets together: Not on file    Attends religious service: Not on file    Active member of club or organization: Not on file    Attends meetings of clubs or organizations: Not on file    Relationship status: Not on file  . Intimate partner violence:    Fear of current or ex partner: Not on file    Emotionally abused: Not on file    Physically abused: Not on file    Forced sexual activity: Not on file  Other Topics Concern  . Not on file  Social History Narrative  . Not on file           Mardella Layman, MD 09/24/17 [phone]

## 2017-11-09 ENCOUNTER — Encounter (INDEPENDENT_AMBULATORY_CARE_PROVIDER_SITE_OTHER): Payer: Self-pay | Admitting: Orthopaedic Surgery

## 2017-11-27 ENCOUNTER — Ambulatory Visit (INDEPENDENT_AMBULATORY_CARE_PROVIDER_SITE_OTHER): Payer: No Typology Code available for payment source | Admitting: Orthopaedic Surgery

## 2017-12-07 ENCOUNTER — Ambulatory Visit: Payer: BLUE CROSS/BLUE SHIELD | Admitting: Nurse Practitioner

## 2018-01-15 ENCOUNTER — Ambulatory Visit: Payer: BLUE CROSS/BLUE SHIELD | Admitting: Nurse Practitioner

## 2018-01-15 ENCOUNTER — Telehealth: Payer: Self-pay | Admitting: Internal Medicine

## 2018-02-12 ENCOUNTER — Ambulatory Visit (INDEPENDENT_AMBULATORY_CARE_PROVIDER_SITE_OTHER): Payer: Self-pay

## 2018-02-12 ENCOUNTER — Ambulatory Visit (INDEPENDENT_AMBULATORY_CARE_PROVIDER_SITE_OTHER): Payer: Worker's Compensation | Admitting: Orthopaedic Surgery

## 2018-02-12 ENCOUNTER — Encounter (INDEPENDENT_AMBULATORY_CARE_PROVIDER_SITE_OTHER): Payer: Self-pay | Admitting: Orthopaedic Surgery

## 2018-02-12 VITALS — BP 139/93 | HR 78 | Ht 67.5 in | Wt 186.0 lb

## 2018-02-12 DIAGNOSIS — M542 Cervicalgia: Secondary | ICD-10-CM

## 2018-02-12 DIAGNOSIS — M47812 Spondylosis without myelopathy or radiculopathy, cervical region: Secondary | ICD-10-CM

## 2018-02-12 NOTE — Progress Notes (Signed)
Office Visit Note   Patient: Brandi Collins           Date of Birth: Oct 01, 1965           MRN: [id number] Visit Date: 02/12/2018              Requested by: Quentin Angst, MD [address] [place], [place] [address] PCP: Quentin Angst, MD   Assessment & Plan: Visit Diagnoses:  1. Neck pain   2. Spondylosis without myelopathy or radiculopathy, cervical region     Plan: On-the-job injury 12/08/2017 with plain radiographs demonstrating pre-existing spondylosis.  She has been treated medically as well as into therapy without improvement.  She is having persistent symptoms now 2 months post injury.  I recommend proceeding with cervical MRI scan office follow-up after scan for review.  Follow-Up Instructions: Return after MRI scan cervical spine performed for review.  Orders:  Orders Placed This Encounter  Procedures  . XR Cervical Spine 2 or 3 views  . MR Cervical Spine w/o contrast   No orders of the defined types were placed in this encounter.     Procedures: No procedures performed   Clinical Data: No additional findings.   Subjective: Chief Complaint  Patient presents with  . Neck - Pain    OTJI 12/08/17    HPI 52 year old female seen with neck and shoulder pain with injury while she was working trying to catch patient was falling on 12/08/2017.  She was [facility] was ordered which actually made it worse she is had some dry needling anti-inflammatories currently taking Aleve.  She went through physical therapy for 3 weeks without improvement.  She denies numbness or tingling in her hands.  When she pulls her dose heavier lifting she has increased pain.  She denies chills or fever.  No lower extremity myelopathic changes.  Patient had previous on-the-job injury for lower back in June 2017 and was last seen by me on 03/27/2016.  Review of Systems 14 point review of system positive for hyperactive adult syndrome, hypertension,  history of low back strain 2017.  Otherwise negative as it pertains HPI.   Objective: Vital Signs: BP (!) 139/93   Pulse 78   Ht 5' 7.5" (1.715 m)   Wt 186 lb (84.4 kg)   BMI 28.70 kg/m   Physical Exam  Constitutional: She is oriented to person, place, and time. She appears well-developed.  HENT:  Head: Normocephalic.  Right Ear: External ear normal.  Left Ear: External ear normal.  Eyes: Pupils are equal, round, and reactive to light.  Neck: No tracheal deviation present. No thyromegaly present.  Cardiovascular: Normal rate.  Pulmonary/Chest: Effort normal.  Abdominal: Soft.  Neurological: She is alert and oriented to person, place, and time.  Skin: Skin is warm and dry.  Psychiatric: She has a normal mood and affect. Her behavior is normal.    Ortho Exam patient has some trapezial tenderness both right and left.  Mild brachial plexus tenderness right and left.  Negative and shoulder impingement both sides.  Biceps triceps brachioradialis reflex are 1+ and symmetrical.  She has discomfort with forward flexion chin 2 fingerbreadths chin to chest pain with extension.  No lower extremity clonus or hyperreflexia.  Specialty Comments:  No specialty comments available.  Imaging: No results found.   PMFS History: Patient Active Problem List   Diagnosis Date Noted  . Screening examination for pulmonary tuberculosis 05/05/2016  . Acute  bilateral low back pain without sciatica 03/27/2016  . Health care maintenance 04/22/2015  . Essential hypertension, benign 04/22/2015  . Annual physical exam 04/28/2013  . Pap smear for cervical cancer screening 04/28/2013  . 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 High blood pressure 04/28/2013   Past Medical History:  Diagnosis Date  . ADHD (attention deficit hyperactivity disorder)     Family History  Problem Relation Age of Onset  . Hypertension Mother   . Heart disease Mother   . Stroke Mother   . Hypertension Daughter     Past  Surgical History:  Procedure Laterality Date  . THROAT SURGERY     patient was 52 years old.    Social History   Occupational History  . Not on file  Tobacco Use  . Smoking status: Never Smoker  . Smokeless tobacco: Never Used  Substance and Sexual Activity  . Alcohol use: No  . Drug use: Yes    Types: Marijuana    Comment: last used marijuana 2 days ago  . Sexual activity: Not on file

## 2018-02-15 ENCOUNTER — Telehealth (INDEPENDENT_AMBULATORY_CARE_PROVIDER_SITE_OTHER): Payer: Self-pay

## 2018-02-15 ENCOUNTER — Encounter (INDEPENDENT_AMBULATORY_CARE_PROVIDER_SITE_OTHER): Payer: Self-pay | Admitting: Orthopaedic Surgery

## 2018-02-15 NOTE — Telephone Encounter (Signed)
Faxed mri order and office note to [facility] [phone]

## 2018-02-15 NOTE — Telephone Encounter (Signed)
-----   Message from Eldred Manges, MD sent at 02/15/2018 12:28 PM EDT ----- W/C thx

## 2018-03-04 DIAGNOSIS — M542 Cervicalgia: Secondary | ICD-10-CM | POA: Insufficient documentation

## 2018-08-31 NOTE — Telephone Encounter (Signed)
error 

## 2018-12-04 ENCOUNTER — Other Ambulatory Visit: Payer: Self-pay

## 2018-12-04 ENCOUNTER — Encounter (HOSPITAL_COMMUNITY): Payer: Self-pay | Admitting: Emergency Medicine

## 2018-12-04 ENCOUNTER — Ambulatory Visit (INDEPENDENT_AMBULATORY_CARE_PROVIDER_SITE_OTHER): Payer: Self-pay

## 2018-12-04 ENCOUNTER — Ambulatory Visit (HOSPITAL_COMMUNITY)
Admission: EM | Admit: 2018-12-04 | Discharge: 2018-12-04 | Disposition: A | Discharge status: Home / Self Care | Payer: Self-pay | Attending: Family Medicine | Admitting: Family Medicine

## 2018-12-04 DIAGNOSIS — M25562 Pain in left knee: Secondary | ICD-10-CM

## 2018-12-04 DIAGNOSIS — I10 Essential (primary) hypertension: Secondary | ICD-10-CM

## 2018-12-04 MED ORDER — IBUPROFEN 800 MG PO TABS
800.0000 mg | ORAL_TABLET | Freq: Once | ORAL | Status: AC
  Administered 2018-12-04: 800 mg via ORAL

## 2018-12-04 MED ORDER — DICLOFENAC SODIUM 75 MG PO TBEC: 75.0000 mg | DELAYED_RELEASE_TABLET | Freq: Two times a day (BID) | ORAL | 0 refills | Status: DC

## 2018-12-04 MED ORDER — IBUPROFEN 800 MG PO TABS
ORAL_TABLET | ORAL | Status: AC
  Filled 2018-12-04: qty 1

## 2018-12-04 NOTE — ED Provider Notes (Signed)
[facility]   [id number] 12/04/18 Arrival Time: 42  ASSESSMENT & PLAN:  1. Acute pain of left knee   2. Essential hypertension     I have personally viewed the imaging studies ordered this visit. No fracture appreciated.  Meds ordered this encounter  Medications  . ibuprofen (ADVIL) tablet 800 mg  . diclofenac (VOLTAREN) 75 MG EC tablet    Sig: Take 1 tablet (75 mg total) by mouth 2 (two) times daily.    Dispense:  14 tablet    Refill:  0    Orders Placed This Encounter  Procedures  . DG Knee Complete 4 Views Left  . Apply knee sleeve  WBAT.  Follow-up Information    Schedule an appointment as soon as possible for a visit  with [facility].   Contact information: [address] [place] [phone]         May f/u here to recheck BP at any time.   Reviewed expectations re: course of current medical issues. Questions answered. Outlined signs and symptoms indicating need for more acute intervention. Patient verbalized understanding. After Visit Summary given.  SUBJECTIVE: History from: patient. Brandi Collins is a 53 y.o. female who reports intermittent mild to moderate pain of her left knee; described as aching without radiation. Onset: gradual, approx 4 days ago. Injury/trama: does report falling onto her knees approx 1.5 m ago but this pain feels different. Does describe occasional feeling of 'knee giving out on me'. Symptoms have progressed to a point and plateaued since beginning. Aggravating factors: certain movements. Alleviating factors: rest. Associated symptoms: none reported. Extremity sensation changes or weakness: none. Self treatment: tried OTCs without relief of pain. History of similar: no.  Past Surgical History:  Procedure Laterality Date  . THROAT SURGERY     patient was 52 years old.     Increased blood pressure noted today. Reports that she has been treated  for hypertension in the past.  She reports no chest pain on exertion, no dyspnea on exertion, no swelling of ankles, no orthostatic dizziness or lightheadedness, no orthopnea or paroxysmal nocturnal dyspnea, no palpitations and no intermittent claudication symptoms.  ROS: As per HPI. All other systems negative.    OBJECTIVE:  Vitals:   12/04/18 1802  BP: (!) 164/92  Pulse: 75  Resp: 16  Temp: 99.1 F (37.3 C)  TempSrc: Oral  SpO2: 97%    General appearance: alert; no distress HEENT: [place]; AT Neck: supple with FROM Resp: unlabored respirations Extremities: . LLE: warm and well perfused; poorly localized mild to moderate tenderness over left medial knee mostly; without gross deformities; with mild swelling; with no bruising; ROM: normal with reported discomfort 'deeper in my knee' CV: brisk extremity capillary refill of LLE; 2+ DP/PT pulse of LLE. Skin: warm and dry; no visible rashes Neurologic: gait normal; normal reflexes of RLE and LLE; normal sensation of RLE and LLE; normal strength of RLE and LLE Psychological: alert and cooperative; normal mood and affect  Imaging: Dg Knee Complete 4 Views Left  Result Date: 12/04/2018 CLINICAL DATA:  Fall 1-2 months ago.  Continued knee pain EXAM: LEFT KNEE - COMPLETE 4+ VIEW COMPARISON:  None. FINDINGS: No evidence of fracture, dislocation, or joint effusion. No evidence of arthropathy or other focal bone abnormality. Soft tissues are unremarkable. IMPRESSION: Negative. Electronically Signed   By: Rolm Baptise M.D.   On: 12/04/2018 19:03      No Known Allergies  Past Medical History:  Diagnosis Date  . ADHD (attention deficit hyperactivity disorder)    Social History   Socioeconomic History  . Marital status: Single    Spouse name: Not on file  . Number of children: Not on file  . Years of education: Not on file  . Highest education level: Not on file  Occupational History  . Not on file  Social Needs  . Financial resource  strain: Not on file  . Food insecurity    Worry: Not on file    Inability: Not on file  . Transportation needs    Medical: Not on file    Non-medical: Not on file  Tobacco Use  . Smoking status: Never Smoker  . Smokeless tobacco: Never Used  Substance and Sexual Activity  . Alcohol use: No  . Drug use: Yes    Types: Marijuana    Comment: last used marijuana 2 days ago  . Sexual activity: Not on file  Lifestyle  . Physical activity    Days per week: Not on file    Minutes per session: Not on file  . Stress: Not on file  Relationships  . Social Musicianconnections    Talks on phone: Not on file    Gets together: Not on file    Attends religious service: Not on file    Active member of club or organization: Not on file    Attends meetings of clubs or organizations: Not on file    Relationship status: Not on file  Other Topics Concern  . Not on file  Social History Narrative  . Not on file   Family History  Problem Relation Age of Onset  . Hypertension Mother   . Heart disease Mother   . Stroke Mother   . Hypertension Daughter    Past Surgical History:  Procedure Laterality Date  . THROAT SURGERY     patient was 53 years old.       Mardella LaymanHagler, Shaquia Berkley, MD 12/07/18 [phone]

## 2018-12-04 NOTE — ED Triage Notes (Signed)
Knee pain left side for days. PT reports her knee has "given out" twice over the last few days.  PT fell 1.5 months ago  PT  Has tried several OTC remedies without relief.   PT also has right heel pain

## 2018-12-25 ENCOUNTER — Encounter (HOSPITAL_COMMUNITY): Payer: Self-pay | Admitting: Emergency Medicine

## 2018-12-25 ENCOUNTER — Ambulatory Visit (HOSPITAL_COMMUNITY)
Admission: EM | Admit: 2018-12-25 | Discharge: 2018-12-25 | Disposition: A | Discharge status: Home / Self Care | Payer: Self-pay | Attending: Family Medicine | Admitting: Family Medicine

## 2018-12-25 ENCOUNTER — Other Ambulatory Visit: Payer: Self-pay

## 2018-12-25 DIAGNOSIS — M25562 Pain in left knee: Secondary | ICD-10-CM

## 2018-12-25 MED ORDER — MELOXICAM 7.5 MG PO TABS: 7.5000 mg | ORAL_TABLET | Freq: Every day | ORAL | 0 refills | Status: DC

## 2018-12-25 MED ORDER — PREDNISONE 10 MG (21) PO TBPK: ORAL_TABLET | ORAL | 0 refills | Status: DC

## 2018-12-25 NOTE — Discharge Instructions (Addendum)
Treating you for arthritis.  Take the prednisone taper as prescribed with food. After you are done with that she can start taking the meloxicam daily If your symptoms continue or worsen you may need to follow-up with orthopedic. You can use the Ace wrap and rest and ice and elevate.

## 2018-12-25 NOTE — ED Provider Notes (Addendum)
[facility]    CSN: [id number] Arrival date & time: 12/25/18  1238     History   Chief Complaint Chief Complaint  Patient presents with  . Leg Pain    HPI Brandi Collins is a 53 y.o. female.   Patient is a 53 year old female with no significant past medical history.  She presents today with left knee pain, swelling.  This is been constant since 12/04/2018.  She was seen here and treated with diclofenac which she reports did not help. She has had increased pain and swelling. Painful to walk with radiation into shin at times. No posterior knee pain or calf pain. No fever or specific injury. Reporting the pain will move around to different areas on her body.   ROS per HPI      Past Medical History:  Diagnosis Date  . ADHD (attention deficit hyperactivity disorder)     Patient Active Problem List   Diagnosis Date Noted  . Screening examination for pulmonary tuberculosis 05/05/2016  . Acute bilateral low back pain without sciatica 03/27/2016  . Health care maintenance 04/22/2015  . Essential hypertension, benign 04/22/2015  . Annual physical exam 04/28/2013  . Pap smear for cervical cancer screening 04/28/2013  . 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 High blood pressure 04/28/2013    Past Surgical History:  Procedure Laterality Date  . THROAT SURGERY     patient was 53 years old.     OB History   No obstetric history on file.      Home Medications    Prior to Admission medications   Medication Sig Start Date End Date Taking? Authorizing Provider  ARIPiprazole (ABILIFY) 10 MG tablet Take 10 mg by mouth daily.    [provider]  diclofenac (VOLTAREN) 75 MG EC tablet Take 1 tablet (75 mg total) by mouth 2 (two) times daily. 12/04/18   Mardella LaymanHagler, Brian, MD  fluticasone (FLONASE) 50 MCG/ACT nasal spray Place 1 spray into both nostrils daily. Patient not taking: Reported on 02/12/2018 09/20/17   Mardella LaymanHagler, Brian, MD  meloxicam (MOBIC) 7.5 MG tablet  Take 1 tablet (7.5 mg total) by mouth daily. 12/25/18   Dahlia ByesBast, Edward Trevino A, NP  oxcarbazepine (TRILEPTAL) 600 MG tablet Take 600 mg by mouth 2 (two) times daily.    [provider]  predniSONE (STERAPRED UNI-PAK 21 TAB) 10 MG (21) TBPK tablet 6 tabs for 1 day, then 5 tabs for 1 das, then 4 tabs for 1 day, then 3 tabs for 1 day, 2 tabs for 1 day, then 1 tab for 1 day 12/25/18   Janace ArisBast, Nayzeth Altman A, NP    Family History Family History  Problem Relation Age of Onset  . Hypertension Mother   . Heart disease Mother   . Stroke Mother   . Hypertension Daughter     Social History Social History   Tobacco Use  . Smoking status: Never Smoker  . Smokeless tobacco: Never Used  Substance Use Topics  . Alcohol use: No  . Drug use: Yes    Types: Marijuana    Comment: last used marijuana 2 days ago     Allergies   Patient has no known allergies.   Review of Systems Review of Systems   Physical Exam Triage Vital Signs ED Triage Vitals [12/25/18 1317]  Enc Vitals Group     BP (!) 157/87     Pulse Rate 75     Resp 18     Temp 98.3 F (36.8 C)  Temp Source Oral     SpO2 100 %     Weight      Height      Head Circumference      Peak Flow      Pain Score 10     Pain Loc      Pain Edu?      Excl. in GC?    No data found.  Updated Vital Signs BP (!) 157/87 (BP Location: Right Arm)   Pulse 75   Temp 98.3 F (36.8 C) (Oral)   Resp 18   SpO2 100%   Visual Acuity Right Eye Distance:   Left Eye Distance:   Bilateral Distance:    Right Eye Near:   Left Eye Near:    Bilateral Near:     Physical Exam Vitals signs and nursing note reviewed.  Constitutional:      General: She is not in acute distress.    Appearance: Normal appearance. She is not ill-appearing, toxic-appearing or diaphoretic.  HENT:     Head: Normocephalic.     Nose: Nose normal.     Mouth/Throat:     Pharynx: Oropharynx is clear.  Eyes:     Conjunctiva/sclera: Conjunctivae normal.  Neck:      Musculoskeletal: Normal range of motion.  Pulmonary:     Effort: Pulmonary effort is normal.  Musculoskeletal: Normal range of motion.        General: Swelling and tenderness present.     Comments: Swelling and tenderness to left knee mostly at the medial aspect  Skin:    General: Skin is warm and dry.     Findings: No rash.  Neurological:     Mental Status: She is alert.  Psychiatric:        Mood and Affect: Mood normal.      UC Treatments / Results  Labs (all labs ordered are listed, but only abnormal results are displayed) Labs Reviewed - No data to display  EKG   Radiology No results found.  Procedures Procedures (including critical care time)  Medications Ordered in UC Medications - No data to display  Initial Impression / Assessment and Plan / UC Course  I have reviewed the triage vital signs and the nursing notes.  Pertinent labs & imaging results that were available during my care of the patient were reviewed by me and considered in my medical decision making (see chart for details).     Pain most likely due to arthritis. We will do prednisone taper over the next 6 days.  She can start the meloxicam after he tapers over if her pain still continues. Ace wrap for swelling, rest, ice, elevate Follow up as needed for continued or worsening symptoms  Final Clinical Impressions(s) / UC Diagnoses   Final diagnoses:  Acute pain of left knee     Discharge Instructions     Treating you for arthritis.  Take the prednisone taper as prescribed with food. After you are done with that she can start taking the meloxicam daily If your symptoms continue or worsen you may need to follow-up with orthopedic. You can use the Ace wrap and rest and ice and elevate.    ED Prescriptions    Medication Sig Dispense Auth. Provider   predniSONE (STERAPRED UNI-PAK 21 TAB) 10 MG (21) TBPK tablet 6 tabs for 1 day, then 5 tabs for 1 das, then 4 tabs for 1 day, then 3 tabs for 1  day, 2 tabs for 1 day, then  1 tab for 1 day 21 tablet Shannara Winbush A, NP   meloxicam (MOBIC) 7.5 MG tablet Take 1 tablet (7.5 mg total) by mouth daily. 15 tablet Loura Halt A, NP     Controlled Substance Prescriptions [place] Controlled Substance Registry consulted? Not Applicable   Orvan July, NP 12/25/18 1401    Loura Halt A, NP 12/25/18 1402

## 2018-12-25 NOTE — ED Triage Notes (Signed)
Pt sts left leg pain; pt sts thinks she needs antibiotics

## 2019-01-12 ENCOUNTER — Other Ambulatory Visit: Payer: Self-pay

## 2019-01-12 ENCOUNTER — Ambulatory Visit (HOSPITAL_COMMUNITY)
Admission: EM | Admit: 2019-01-12 | Discharge: 2019-01-12 | Disposition: A | Discharge status: Home / Self Care | Payer: Self-pay

## 2019-01-12 ENCOUNTER — Telehealth: Payer: Self-pay

## 2019-01-12 NOTE — ED Notes (Signed)
Brandi Collins ( patient access) reports patient could not wait and made an appt for tomorrow.

## 2019-01-19 ENCOUNTER — Ambulatory Visit (HOSPITAL_COMMUNITY)
Admission: EM | Admit: 2019-01-19 | Discharge: 2019-01-19 | Disposition: A | Discharge status: Home / Self Care | Payer: Self-pay | Attending: Family Medicine | Admitting: Family Medicine

## 2019-01-19 ENCOUNTER — Encounter (HOSPITAL_COMMUNITY): Payer: Self-pay

## 2019-01-19 ENCOUNTER — Other Ambulatory Visit: Payer: Self-pay

## 2019-01-19 DIAGNOSIS — M25362 Other instability, left knee: Secondary | ICD-10-CM

## 2019-01-19 DIAGNOSIS — M25561 Pain in right knee: Secondary | ICD-10-CM

## 2019-01-19 DIAGNOSIS — M25562 Pain in left knee: Secondary | ICD-10-CM

## 2019-01-19 MED ORDER — IBUPROFEN 800 MG PO TABS
800.0000 mg | ORAL_TABLET | Freq: Once | ORAL | Status: AC
  Administered 2019-01-19: 13:00:00 800 mg via ORAL

## 2019-01-19 MED ORDER — IBUPROFEN 800 MG PO TABS: 800.0000 mg | ORAL_TABLET | Freq: Three times a day (TID) | ORAL | 0 refills | Status: DC | PRN

## 2019-01-19 MED ORDER — IBUPROFEN 800 MG PO TABS
ORAL_TABLET | ORAL | Status: AC
  Filled 2019-01-19: qty 1

## 2019-01-19 NOTE — Discharge Instructions (Addendum)
After every work shift, ice your knees for 20 minutes. Use knee sleeves for every shift to help take burden off of your knees.

## 2019-01-19 NOTE — ED Triage Notes (Signed)
Pt presents with ongoing chronic bilateral knee pain; pt was seen previously and diagnosed with arthritis.

## 2019-01-19 NOTE — ED Provider Notes (Signed)
MRN: [id number] DOB: 1966/02/17  Subjective:   Brandi Collins is a 53 y.o. female presenting for persistent sharp burning left knee pain now having right knee pain.  Patient has had imaging done 12/09/2018, was negative.  She does have an orthopedist with [facility].  Has not reached out to them for consult.  Her last office visit with us was 01/12/2019, was given a prescription for steroid course, meloxicam thereafter.  She states that these medications did not help.  She did much better with ibuprofen.  Patient works as a Clinical biochemistCMA, does a lot of walking and standing, weightbearing.  She has not taken time from work.  No current facility-administered medications for this encounter.   Current Outpatient Medications:  .  ARIPiprazole (ABILIFY) 10 MG tablet, Take 10 mg by mouth daily., Disp: , Rfl:  .  diclofenac (VOLTAREN) 75 MG EC tablet, Take 1 tablet (75 mg total) by mouth 2 (two) times daily., Disp: 14 tablet, Rfl: 0 .  fluticasone (FLONASE) 50 MCG/ACT nasal spray, Place 1 spray into both nostrils daily. (Patient not taking: Reported on 02/12/2018), Disp: 16 g, Rfl: 2 .  oxcarbazepine (TRILEPTAL) 600 MG tablet, Take 600 mg by mouth 2 (two) times daily., Disp: , Rfl:     No Known Allergies   Past Medical History:  Diagnosis Date  . ADHD (attention deficit hyperactivity disorder)      Past Surgical History:  Procedure Laterality Date  . THROAT SURGERY     patient was 53 years old.     ROS  Objective:   Vitals: BP 132/84 (BP Location: Right Arm)   Pulse 83   Temp 98.4 F (36.9 C) (Oral)   Resp 18   SpO2 100%   Physical Exam Constitutional:      General: She is not in acute distress.    Appearance: Normal appearance. She is well-developed. She is not ill-appearing.  HENT:     Head: Normocephalic and atraumatic.     Nose: Nose normal.     Mouth/Throat:     Mouth: Mucous membranes are moist.     Pharynx: Oropharynx is clear.  Eyes:     General: No scleral icterus.  Extraocular Movements: Extraocular movements intact.     Pupils: Pupils are equal, round, and reactive to light.  Cardiovascular:     Rate and Rhythm: Normal rate.  Pulmonary:     Effort: Pulmonary effort is normal.  Musculoskeletal:     Right knee: She exhibits normal range of motion, no swelling, no effusion, no ecchymosis, no deformity, no laceration, no erythema, normal alignment and normal patellar mobility. Tenderness (As well as quadriceps tendon) found. Medial joint line tenderness noted. No lateral joint line, no MCL, no LCL and no patellar tendon tenderness noted.     Left knee: She exhibits swelling (Trace just superior to the patella). She exhibits normal range of motion, no effusion, no ecchymosis, no deformity, no laceration, no erythema, normal alignment, normal patellar mobility, no bony tenderness and normal meniscus. Tenderness (As well as the quadriceps tendon) found. Medial joint line tenderness noted. No lateral joint line, no MCL, no LCL and no patellar tendon tenderness noted.  Skin:    General: Skin is warm and dry.  Neurological:     General: No focal deficit present.     Mental Status: She is alert and oriented to person, place, and time.  Psychiatric:        Mood and Affect: Mood normal.  Behavior: Behavior normal.     FINDINGS: No evidence of fracture, dislocation, or joint effusion. No evidence of arthropathy or other focal bone abnormality. Soft tissues are unremarkable.  IMPRESSION: Negative.  Electronically Signed   By: Rolm Baptise M.D.   On: 12/04/2018 19:03  Assessment and Plan :   1. Acute pain of left knee   2. Acute pain of right knee   3. Knee buckling, left     Refilled patient's ibuprofen.  Recommended patient set up a follow-up as soon as possible with Dr. Lorin Mercy, her orthopedist.  Discussed possibility of soft tissue problem including degenerative meniscus tear or ligamentous problem.  Patient just had imaging done in July 2020  and was negative, therefore will defer to her orthopedist for further imaging including ultrasound or MRI.  Patient is agreeable to this. Counseled patient on potential for adverse effects with medications prescribed/recommended today, ER and return-to-clinic precautions discussed, patient verbalized understanding.    Jaynee Eagles, [place] 01/19/19 9211

## 2019-01-28 ENCOUNTER — Other Ambulatory Visit: Payer: Self-pay | Admitting: Internal Medicine

## 2019-01-28 ENCOUNTER — Other Ambulatory Visit: Payer: Self-pay | Admitting: Physician Assistant

## 2019-01-28 DIAGNOSIS — Z1231 Encounter for screening mammogram for malignant neoplasm of breast: Secondary | ICD-10-CM

## 2019-01-30 ENCOUNTER — Other Ambulatory Visit: Payer: Self-pay

## 2019-01-30 ENCOUNTER — Ambulatory Visit
Admission: RE | Admit: 2019-01-30 | Discharge: 2019-01-30 | Disposition: A | Discharge status: Home / Self Care | Payer: Self-pay | Source: Ambulatory Visit | Attending: Internal Medicine | Admitting: Internal Medicine

## 2019-01-30 DIAGNOSIS — Z1231 Encounter for screening mammogram for malignant neoplasm of breast: Secondary | ICD-10-CM

## 2019-02-03 ENCOUNTER — Telehealth: Payer: Self-pay | Admitting: *Deleted

## 2019-02-03 NOTE — Telephone Encounter (Signed)
-----   Message from Tresa Garter, MD sent at 02/03/2019 11:00 AM EDT ----- Please inform patient that her screening mammogram shows no evidence of malignancy. Recommend screening mammogram in one year

## 2019-02-03 NOTE — Telephone Encounter (Signed)
Medical Assistant left message on patient's home and cell voicemail. Voicemail states to give a call back to [place] with [facility] at [phone]. Patient is aware of mammogram being negative for malignancy.

## 2019-02-04 ENCOUNTER — Ambulatory Visit: Payer: Self-pay | Admitting: Orthopaedic Surgery

## 2019-02-05 ENCOUNTER — Encounter: Payer: Self-pay | Admitting: Specialist

## 2019-02-05 ENCOUNTER — Ambulatory Visit (INDEPENDENT_AMBULATORY_CARE_PROVIDER_SITE_OTHER): Payer: Self-pay | Admitting: Specialist

## 2019-02-05 VITALS — BP 177/111 | HR 65 | Ht 68.5 in | Wt 186.0 lb

## 2019-02-05 DIAGNOSIS — M222X2 Patellofemoral disorders, left knee: Secondary | ICD-10-CM

## 2019-02-05 DIAGNOSIS — M25462 Effusion, left knee: Secondary | ICD-10-CM

## 2019-02-05 DIAGNOSIS — M2392 Unspecified internal derangement of left knee: Secondary | ICD-10-CM

## 2019-02-05 DIAGNOSIS — M958 Other specified acquired deformities of musculoskeletal system: Secondary | ICD-10-CM

## 2019-02-05 MED ORDER — LIDOCAINE HCL 1 % IJ SOLN
5.0000 mL | INTRAMUSCULAR | Status: AC | PRN
  Administered 2019-02-05: 10:00:00 5 mL

## 2019-02-05 MED ORDER — TRAMADOL HCL 50 MG PO TABS: 100.0000 mg | ORAL_TABLET | Freq: Four times a day (QID) | ORAL | 0 refills | Status: AC | PRN

## 2019-02-05 MED ORDER — BUPIVACAINE HCL 0.5 % IJ SOLN
3.0000 mL | INTRAMUSCULAR | Status: AC | PRN
  Administered 2019-02-05: 10:00:00 3 mL via INTRA_ARTICULAR

## 2019-02-05 MED ORDER — METHYLPREDNISOLONE ACETATE 40 MG/ML IJ SUSP
40.0000 mg | INTRAMUSCULAR | Status: AC | PRN
  Administered 2019-02-05: 10:00:00 40 mg via INTRA_ARTICULAR

## 2019-02-05 MED ORDER — NAPROXEN 500 MG PO TABS: 500.0000 mg | ORAL_TABLET | Freq: Two times a day (BID) | ORAL | 0 refills | Status: DC

## 2019-02-05 NOTE — Progress Notes (Signed)
Office Visit Note   Patient: Brandi Collins           Date of Birth: October 27, 1965           MRN: [id number] Visit Date: 02/05/2019              Requested by: Tresa Garter, MD [address] [place],  [place] [address] PCP: Tresa Garter, MD   Assessment & Plan: Visit Diagnoses:  1. Effusion, left knee   2. Acute internal derangement of left knee   3. Patellofemoral pain syndrome of left knee   4. Osteochondral defect of patella     Plan:  Knee is suffering from osteoarthritis, only real proven treatments are Weight loss, NSIADs like naprosyn and exercise. Well padded shoes help. Ice the knee 2-3 times a day 15-20 mins at a time. Extension exercises of the left knee. Avoid squatting, kneeling and stair climbing and use a cane to decrease stress on the left knee. MRI of the left knee is ordered to assess for knee cap injury. Tramadol  1 tablets every 6 hours for pain.  Follow-Up Instructions: No follow-ups on file.   Orders:  No orders of the defined types were placed in this encounter.  No orders of the defined types were placed in this encounter.     Procedures: Large Joint Inj: L knee on 02/05/2019 10:26 AM Indications: pain, joint swelling and diagnostic evaluation Details: 18 G 1.5 in needle, superolateral approach  Arthrogram: No  Medications: 40 mg methylPREDNISolone acetate 40 MG/ML; 3 mL bupivacaine 0.5 %; 5 mL lidocaine 1 % Aspirate: blood-tinged; sent for lab analysis Outcome: tolerated well, no immediate complications  Left knee aspirated blood tingled thin synovial fluid. Sent for cell count and differential and crystal. And gram stain and C&S.  Procedure, treatment alternatives, risks and benefits explained, specific risks discussed. Consent was given by the patient. Immediately prior to procedure a time out was called to verify the correct patient, procedure, equipment, support staff and site/side marked as required. Patient was  prepped and draped in the usual sterile fashion.       Clinical Data: No additional findings.   Subjective: Chief Complaint  Patient presents with  . Left Knee - Pain    53 year old female with history of left knee pain diffuse anterior pain that is constant and at night also. There is pain with weight bearing and there is buckling when she walks the left knee gives away. No injury or increase in activity associated with the onset of the left knee pain . The right knee with have some pain also, sometimes the pain will go over to the right knee. There is swelling of the left knee, a little puffy and then go down, but now is the biggest for the last 2 days. She saw Dr. Lorin Mercy and had a work related back injury with injection of back in the past about 2017. The back improved. She went out of work 04/27/2018 her last day at work, she quit and had received a settlement concerning her neck injury on the job. Now she is looking to return to work and is also trying to get disability. 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 Voiding normally and BMs are normal. The left knee with pain standing and walking and squatting and kneeling. Standing back on her legs with pain.  Review of Systems  Constitutional: Positive for activity change.  HENT: Negative.   Eyes: Negative.   Respiratory: Negative.   Cardiovascular: Negative.   Gastrointestinal: Negative.   Endocrine: Negative.   Genitourinary: Negative.   Musculoskeletal: Positive for arthralgias, back pain, gait problem and joint swelling.  Skin: Negative.   Allergic/Immunologic: Negative.   Neurological: Positive for weakness and numbness.  Hematological: Negative for adenopathy. Does not bruise/bleed easily.  Psychiatric/Behavioral: Positive for decreased concentration, sleep disturbance and suicidal ideas. The patient is nervous/anxious.      Objective: Vital  Signs: BP (!) 177/111 (BP Location: Left Arm, Patient Position: Sitting)   Pulse 65   Ht 5' 8.5" (1.74 m)   Wt 186 lb (84.4 kg)   BMI 27.87 kg/m   Physical Exam Constitutional:      Appearance: She is well-developed.  HENT:     Head: Normocephalic and atraumatic.  Eyes:     Pupils: Pupils are equal, round, and reactive to light.  Neck:     Musculoskeletal: Normal range of motion and neck supple.  Pulmonary:     Effort: Pulmonary effort is normal.     Breath sounds: Normal breath sounds.  Abdominal:     General: Bowel sounds are normal.     Palpations: Abdomen is soft.  Musculoskeletal:     Left knee: She exhibits effusion.  Skin:    General: Skin is warm and dry.  Neurological:     Mental Status: She is alert and oriented to person, place, and time.  Psychiatric:        Behavior: Behavior normal.        Thought Content: Thought content normal.        Judgment: Judgment normal.     Left Knee Exam   Tenderness  The patient is experiencing tenderness in the patella and patellar tendon.  Range of Motion  Extension:  -5 abnormal  Flexion: 130   Tests  McMurray:  Medial - negative Lateral - negative Valgus: negative Lachman:  Anterior - negative    Posterior - negative Drawer:  Anterior - negative     Posterior - negative Pivot shift: negative Patellar apprehension: positive  Other  Erythema: absent Scars: absent Sensation: normal Pulse: present Swelling: moderate Effusion: effusion present      Specialty Comments:  No specialty comments available.  Imaging: No results found.   PMFS History: Patient Active Problem List   Diagnosis Date Noted  . Screening examination for pulmonary tuberculosis 05/05/2016  . Acute bilateral low back pain without sciatica 03/27/2016  . Health care maintenance 04/22/2015  . Essential hypertension, benign 04/22/2015  . Annual physical exam 04/28/2013  . Pap smear for cervical cancer screening 04/28/2013  .  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 High blood pressure 04/28/2013   Past Medical History:  Diagnosis Date  . ADHD (attention deficit hyperactivity disorder)     Family History  Problem Relation Age of Onset  . Hypertension Mother   . Heart disease Mother   . Stroke Mother   . Hypertension Daughter     Past Surgical History:  Procedure Laterality Date  . THROAT SURGERY     patient was 53 years old.    Social History   Occupational History  . Not on file  Tobacco Use  . Smoking status: Never Smoker  . Smokeless tobacco: Never Used  Substance and Sexual Activity  . Alcohol use: No  . Drug use: Yes    Types: Marijuana  Comment: last used marijuana 2 days ago  . Sexual activity: Not on file

## 2019-02-05 NOTE — Patient Instructions (Addendum)
Plan:  Knee is suffering from osteoarthritis, only real proven treatments are Weight loss, NSIADs like naprosyn and exercise. Well padded shoes help. Ice the knee 2-3 times a day 15-20 mins at a time. Extension exercises of the left knee. Avoid squatting, kneeling and stair climbing and use a cane to decrease stress on the left knee. MRI of the left knee is ordered to assess for knee cap injury. Tramadol  1 tablets every 6 hours for pain.

## 2019-02-05 NOTE — Addendum Note (Signed)
Addended by: Minda Ditto, Alyse Low N on: 02/05/2019 10:56 AM   Modules accepted: Orders

## 2019-02-06 LAB — SYNOVIAL CELL COUNT + DIFF, W/ CRYSTALS
Basophils, %: 0 %
Eosinophils-Synovial: 0 % (ref 0–2)
Lymphocytes-Synovial Fld: 66 % (ref 0–74)
Monocyte/Macrophage: 23 % (ref 0–69)
Neutrophil, Synovial: 11 % (ref 0–24)
Synoviocytes, %: 0 % (ref 0–15)
WBC, Synovial: 246 cells/uL — ABNORMAL HIGH (ref <150)

## 2019-03-06 ENCOUNTER — Other Ambulatory Visit: Payer: Self-pay

## 2019-03-06 ENCOUNTER — Ambulatory Visit (HOSPITAL_COMMUNITY)
Admission: RE | Admit: 2019-03-06 | Discharge: 2019-03-06 | Disposition: A | Discharge status: Home / Self Care | Payer: Self-pay | Source: Ambulatory Visit | Attending: Specialist | Admitting: Specialist

## 2019-03-06 DIAGNOSIS — M2392 Unspecified internal derangement of left knee: Secondary | ICD-10-CM | POA: Insufficient documentation

## 2019-03-06 DIAGNOSIS — M222X2 Patellofemoral disorders, left knee: Secondary | ICD-10-CM | POA: Insufficient documentation

## 2019-03-06 DIAGNOSIS — M958 Other specified acquired deformities of musculoskeletal system: Secondary | ICD-10-CM | POA: Insufficient documentation

## 2019-03-06 DIAGNOSIS — M25462 Effusion, left knee: Secondary | ICD-10-CM | POA: Insufficient documentation

## 2019-03-20 ENCOUNTER — Ambulatory Visit: Payer: Self-pay | Admitting: Specialist

## 2019-03-21 ENCOUNTER — Telehealth: Payer: Self-pay
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
## 2019-03-21 NOTE — Telephone Encounter (Signed)
I called and sched for 03/27/2019

## 2019-03-27 ENCOUNTER — Ambulatory Visit: Payer: Self-pay | Admitting: Specialist

## 2019-03-28 ENCOUNTER — Ambulatory Visit (INDEPENDENT_AMBULATORY_CARE_PROVIDER_SITE_OTHER): Payer: Self-pay | Admitting: Specialist

## 2019-03-28 ENCOUNTER — Other Ambulatory Visit: Payer: Self-pay

## 2019-03-28 ENCOUNTER — Encounter: Payer: Self-pay | Admitting: Specialist

## 2019-03-28 VITALS — BP 140/87 | HR 61 | Ht 68.5 in | Wt 186.0 lb

## 2019-03-28 DIAGNOSIS — M2392 Unspecified internal derangement of left knee: Secondary | ICD-10-CM

## 2019-03-28 DIAGNOSIS — S83282A Other tear of lateral meniscus, current injury, left knee, initial encounter: Secondary | ICD-10-CM

## 2019-03-28 MED ORDER — HYDROCODONE-ACETAMINOPHEN 7.5-325 MG PO TABS: 1.0000 | ORAL_TABLET | Freq: Four times a day (QID) | ORAL | 0 refills | Status: DC | PRN

## 2019-03-28 MED ORDER — METHYLPREDNISOLONE ACETATE 40 MG/ML IJ SUSP
40.0000 mg | INTRAMUSCULAR | Status: AC | PRN
  Administered 2019-03-28: 15:00:00 40 mg via INTRA_ARTICULAR

## 2019-03-28 MED ORDER — BUPIVACAINE HCL 0.25 % IJ SOLN
4.0000 mL | INTRAMUSCULAR | Status: AC | PRN
  Administered 2019-03-28: 4 mL via INTRA_ARTICULAR

## 2019-03-28 NOTE — Patient Instructions (Signed)
  Knee is suffering from torn lateral meniscus,Well padded shoes help. Ice the knee 2-3 times a day 15-20 mins at a time. Use of a cane on the side of the knee injury. Referral for arthroscopic surgery of the left knee and partial meniscectomy.

## 2019-03-28 NOTE — Progress Notes (Signed)
Office Visit Note   Patient: Brandi Collins           Date of Birth: 1965-12-30           MRN: [id number] Visit Date: 03/28/2019              Requested by: Quentin Angst, MD [address] [place],  [place] [address] PCP: Quentin Angst, MD   Assessment & Plan: Visit Diagnoses:  1. Acute lateral meniscus tear of left knee, initial encounter   2. Acute internal derangement of left knee     Plan: Knee is suffering from torn lateral meniscus,Well padded shoes help. Ice the knee 2-3 times a day 15-20 mins at a time. Use of a cane on the side of the knee injury. Referral for arthroscopic surgery of the left knee and partial meniscectomy.   Follow-Up Instructions: Return in about 1 week (around 04/04/2019), or Appt with Dr. August Saucer or Dr. Roda Shutters or Dr. Ophelia Charter or Dr.Whitfield.   Orders:  No orders of the defined types were placed in this encounter.  No orders of the defined types were placed in this encounter.     Procedures: Large Joint Inj: L knee on 03/28/2019 2:41 PM Indications: pain Details: 25 G 1.5 in needle, anterolateral approach  Arthrogram: No  Medications: 40 mg methylPREDNISolone acetate 40 MG/ML; 4 mL bupivacaine 0.25 % Outcome: tolerated well, no immediate complications  Bandaid applied  Procedure, treatment alternatives, risks and benefits explained, specific risks discussed. Consent was given by the patient. Immediately prior to procedure a time out was called to verify the correct patient, procedure, equipment, support staff and site/side marked as required. Patient was prepped and draped in the usual sterile fashion.       Clinical Data: Findings:  CLINICAL DATA:  Left knee pain and swelling for the past 2 months.  EXAM: MRI OF THE LEFT KNEE WITHOUT CONTRAST  TECHNIQUE: Multiplanar, multisequence MR imaging of the knee was performed. No intravenous contrast was administered.  COMPARISON:  Left knee x-rays dated December 04, 2018.   FINDINGS: MENISCI  Medial meniscus:  Intact.  Lateral meniscus: Horizontal tear of the body and anterior horn. Additional radial tear of the body.  LIGAMENTS  Cruciates:  Intact ACL and PCL.  Collaterals: Medial collateral ligament is intact. Lateral collateral ligament complex is intact.  CARTILAGE  Patellofemoral: Mild partial-thickness cartilage loss over the lateral patellar facet.  Medial:  No chondral defect.  Lateral: Mild partial-thickness cartilage loss over the lateral femoral condyle.  Joint: Small joint effusion. Minimal edema within Hoffa's fat. No plical thickening.  Popliteal Fossa:  No Baker cyst. Intact popliteus tendon.  Extensor Mechanism: Intact quadriceps tendon and patellar tendon. Intact medial and lateral patellar retinaculum. Intact MPFL.  Bones: No focal marrow signal abnormality. No fracture or dislocation.  Other: None.  IMPRESSION: 1. Horizontal tear of the lateral meniscus body and anterior horn. Additional radial tear of the body. 2. Mild lateral and patellofemoral compartment degenerative changes. 3. Small joint effusion.   Electronically Signed   By: Obie Dredge M.D.   On: 03/07/2019 08:40    Subjective: Chief Complaint  Patient presents with  . Left Knee - Follow-up    MRI Review    53 year old female with a spontaneous onset of left knee pain anteriorly with popping and catching of the left knee. She underwent cortisone injection with only temporary relief of Pain and has had post exercises swelling of the left  knee with stiffness and night pain. Due to persistence of mechanical knee pain MRI was done.    Review of Systems  Constitutional: Negative.   HENT: Negative.   Eyes: Negative.  Negative for photophobia, pain, redness, itching and visual disturbance.  Respiratory: Negative.  Negative for apnea, cough, choking, chest tightness, shortness of breath, wheezing and stridor.   Cardiovascular:  Positive for leg swelling. Negative for chest pain and palpitations.  Gastrointestinal: Negative.  Negative for abdominal distention, abdominal pain, anal bleeding, blood in stool, constipation, diarrhea, nausea, rectal pain and vomiting.  Endocrine: Negative.   Genitourinary: Negative.   Musculoskeletal: Positive for arthralgias.  Skin: Negative.   Allergic/Immunologic: Negative.   Neurological: Negative.   Hematological: Negative.   Psychiatric/Behavioral: Negative.      Objective: Vital Signs: BP 140/87 (BP Location: Left Arm, Patient Position: Sitting)   Pulse 61   Ht 5' 8.5" (1.74 m)   Wt 186 lb (84.4 kg)   BMI 27.87 kg/m   Physical Exam Constitutional:      Appearance: She is well-developed.  HENT:     Head: Normocephalic and atraumatic.  Eyes:     Pupils: Pupils are equal, round, and reactive to light.  Neck:     Musculoskeletal: Normal range of motion and neck supple.  Pulmonary:     Effort: Pulmonary effort is normal.     Breath sounds: Normal breath sounds.  Abdominal:     General: Bowel sounds are normal.     Palpations: Abdomen is soft.  Musculoskeletal: Normal range of motion.     Left knee: She exhibits effusion.  Skin:    General: Skin is warm and dry.  Neurological:     Mental Status: She is alert and oriented to person, place, and time.  Psychiatric:        Behavior: Behavior normal.        Thought Content: Thought content normal.        Judgment: Judgment normal.     Left Knee Exam  Left knee exam is normal.  Muscle Strength  The patient has normal left knee strength.  Tenderness  The patient is experiencing tenderness in the lateral joint line and medial joint line.  Range of Motion  Extension: normal  Flexion: normal   Tests  McMurray:  Medial - negative Lateral - positive Valgus: positive Lachman:  Anterior - negative    Posterior - negative Drawer:  Anterior - negative     Posterior - negative  Other  Erythema: absent Scars:  absent Sensation: normal Swelling: moderate Effusion: effusion present  Comments:  -10 to 110      Specialty Comments:  No specialty comments available.  Imaging: No results found.   PMFS History: Patient Active Problem List   Diagnosis Date Noted  . Screening examination for pulmonary tuberculosis 05/05/2016  . Acute bilateral low back pain without sciatica 03/27/2016  . Health care maintenance 04/22/2015  . Essential hypertension, benign 04/22/2015  . Annual physical exam 04/28/2013  . Pap smear for cervical cancer screening 04/28/2013  . 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 High blood pressure 04/28/2013   Past Medical History:  Diagnosis Date  . ADHD (attention deficit hyperactivity disorder)     Family History  Problem Relation Age of Onset  . Hypertension Mother   . Heart disease Mother   . Stroke Mother   . Hypertension Daughter     Past Surgical History:  Procedure Laterality Date  . THROAT SURGERY  patient was 53 years old.    Social History   Occupational History  . Not on file  Tobacco Use  . Smoking status: Never Smoker  . Smokeless tobacco: Never Used  Substance and Sexual Activity  . Alcohol use: No  . Drug use: Yes    Types: Marijuana    Comment: last used marijuana 2 days ago  . Sexual activity: Not on file

## 2019-03-28 NOTE — Addendum Note (Signed)
Addended by: Basil Dess on: 03/28/2019 02:45 PM   Modules accepted: Orders

## 2019-04-08 ENCOUNTER — Encounter (HOSPITAL_BASED_OUTPATIENT_CLINIC_OR_DEPARTMENT_OTHER): Payer: Self-pay | Admitting: *Deleted

## 2019-04-08 ENCOUNTER — Encounter: Payer: Self-pay | Admitting: Orthopaedic Surgery

## 2019-04-08 ENCOUNTER — Other Ambulatory Visit (HOSPITAL_COMMUNITY)
Admission: RE | Admit: 2019-04-08 | Discharge: 2019-04-08 | Disposition: A | Discharge status: Home / Self Care | Payer: Self-pay | Source: Ambulatory Visit | Attending: Orthopaedic Surgery | Admitting: Orthopaedic Surgery

## 2019-04-08 ENCOUNTER — Other Ambulatory Visit: Payer: Self-pay

## 2019-04-08 ENCOUNTER — Ambulatory Visit (INDEPENDENT_AMBULATORY_CARE_PROVIDER_SITE_OTHER): Payer: Self-pay | Admitting: Orthopaedic Surgery

## 2019-04-08 DIAGNOSIS — S83282A Other tear of lateral meniscus, current injury, left knee, initial encounter: Secondary | ICD-10-CM | POA: Insufficient documentation

## 2019-04-08 DIAGNOSIS — Z20828 Contact with and (suspected) exposure to other viral communicable diseases: Secondary | ICD-10-CM | POA: Insufficient documentation

## 2019-04-08 DIAGNOSIS — Z01812 Encounter for preprocedural laboratory examination: Secondary | ICD-10-CM | POA: Insufficient documentation

## 2019-04-08 NOTE — Progress Notes (Signed)
Office Visit Note   Patient: Brandi Collins           Date of Birth: 05-Jan-1966           MRN: [id number] Visit Date: 04/08/2019              Requested by: Tresa Garter, MD [address] [place],  [place] [address] PCP: Tresa Garter, MD   Assessment & Plan: Visit Diagnoses:  1. Acute lateral meniscus tear of left knee, initial encounter     Plan: Impression is symptomatic left acute lateral meniscus tear and recurrent effusions.  The MRI was reviewed with the patient in detail today.  Based on our discussion patient has elected to proceed with partial lateral meniscectomy and debridement as indicated.  Risk benefits alternatives and recovery reviewed with the patient today.  We will schedule her surgery in the near future.  Follow-Up Instructions: Return for 1 week postop visit.   Orders:  No orders of the defined types were placed in this encounter.  No orders of the defined types were placed in this encounter.     Procedures: No procedures performed   Clinical Data: No additional findings.   Subjective: Chief Complaint  Patient presents with  . Left Knee - Pain    Patient is a 52 year old female who has been sent to [place] by Dr. Louanne Collins for surgical evaluation and acute left lateral meniscus tear that she developed earlier this year sometime during the summer.  She states that she has significant discomfort from the effusion.  This has been aspirated and injected with cortisone twice each time with temporary relief.  She states the pain is worse at night.  Denies any true mechanical symptoms.   Review of Systems  Constitutional: Negative.   HENT: Negative.   Eyes: Negative.   Respiratory: Negative.   Cardiovascular: Negative.   Endocrine: Negative.   Musculoskeletal: Negative.   Neurological: Negative.   Hematological: Negative.   Psychiatric/Behavioral: Negative.   All other systems reviewed and are negative.    Objective: Vital Signs:  There were no vitals taken for this visit.  Physical Exam Vitals signs and nursing note reviewed.  Constitutional:      Appearance: She is well-developed.  Pulmonary:     Effort: Pulmonary effort is normal.  Skin:    General: Skin is warm.     Capillary Refill: Capillary refill takes less than 2 seconds.  Neurological:     Mental Status: She is alert and oriented to person, place, and time.  Psychiatric:        Behavior: Behavior normal.        Thought Content: Thought content normal.        Judgment: Judgment normal.     Ortho Exam Left knee exam shows a moderate joint effusion.  She has tenderness of the knee throughout.  She has slightly limited range of motion due to the effusion.  Collaterals and cruciates are stable. Specialty Comments:  No specialty comments available.  Imaging: No results found.   PMFS History: Patient Active Problem List   Diagnosis Date Noted  . Acute lateral meniscus tear of left knee 04/08/2019  . Screening examination for pulmonary tuberculosis 05/05/2016  . Acute bilateral low back pain without sciatica 03/27/2016  . Health care maintenance 04/22/2015  . Essential hypertension, benign 04/22/2015  . Annual physical exam 04/28/2013  . Pap smear for cervical cancer screening 04/28/2013  . 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 High  blood pressure 04/28/2013   Past Medical History:  Diagnosis Date  . ADHD (attention deficit hyperactivity disorder)     Family History  Problem Relation Age of Onset  . Hypertension Mother   . Heart disease Mother   . Stroke Mother   . Hypertension Daughter     Past Surgical History:  Procedure Laterality Date  . THROAT SURGERY     patient was 53 years old.    Social History   Occupational History  . Not on file  Tobacco Use  . Smoking status: Never Smoker  . Smokeless tobacco: Never Used  Substance and Sexual Activity  . Alcohol use: No  . Drug use: Yes    Types: Marijuana    Comment: last  used marijuana 2 days ago  . Sexual activity: Not on file

## 2019-04-09 LAB — NOVEL CORONAVIRUS, NAA (HOSP ORDER, SEND-OUT TO REF LAB; TAT 18-24 HRS): SARS-CoV-2, NAA: NOT DETECTED

## 2019-04-11 ENCOUNTER — Other Ambulatory Visit: Payer: Self-pay | Admitting: Physician Assistant

## 2019-04-11 ENCOUNTER — Encounter: Payer: Self-pay | Admitting: Orthopaedic Surgery

## 2019-04-11 ENCOUNTER — Encounter (HOSPITAL_BASED_OUTPATIENT_CLINIC_OR_DEPARTMENT_OTHER)
Admission: RE | Disposition: A | Discharge status: Home / Self Care | Payer: Self-pay | Source: Home / Self Care | Attending: Orthopaedic Surgery

## 2019-04-11 ENCOUNTER — Ambulatory Visit (HOSPITAL_BASED_OUTPATIENT_CLINIC_OR_DEPARTMENT_OTHER): Payer: Self-pay | Admitting: Anesthesiology

## 2019-04-11 ENCOUNTER — Encounter (HOSPITAL_BASED_OUTPATIENT_CLINIC_OR_DEPARTMENT_OTHER): Payer: Self-pay | Admitting: *Deleted

## 2019-04-11 ENCOUNTER — Ambulatory Visit (HOSPITAL_BASED_OUTPATIENT_CLINIC_OR_DEPARTMENT_OTHER)
Admission: RE | Admit: 2019-04-11 | Discharge: 2019-04-11 | Disposition: A | Discharge status: Home / Self Care | Payer: Self-pay | Attending: Orthopaedic Surgery | Admitting: Orthopaedic Surgery

## 2019-04-11 DIAGNOSIS — M94262 Chondromalacia, left knee: Secondary | ICD-10-CM | POA: Insufficient documentation

## 2019-04-11 DIAGNOSIS — Z791 Long term (current) use of non-steroidal anti-inflammatories (NSAID): Secondary | ICD-10-CM | POA: Insufficient documentation

## 2019-04-11 DIAGNOSIS — F909 Attention-deficit hyperactivity disorder, unspecified type: Secondary | ICD-10-CM | POA: Insufficient documentation

## 2019-04-11 DIAGNOSIS — Z8249 Family history of ischemic heart disease and other diseases of the circulatory system: Secondary | ICD-10-CM | POA: Insufficient documentation

## 2019-04-11 DIAGNOSIS — M659 Synovitis and tenosynovitis, unspecified: Secondary | ICD-10-CM

## 2019-04-11 DIAGNOSIS — Z79899 Other long term (current) drug therapy: Secondary | ICD-10-CM | POA: Insufficient documentation

## 2019-04-11 DIAGNOSIS — M65969 Unspecified synovitis and tenosynovitis, unspecified lower leg: Secondary | ICD-10-CM

## 2019-04-11 DIAGNOSIS — X58XXXA Exposure to other specified factors, initial encounter: Secondary | ICD-10-CM | POA: Insufficient documentation

## 2019-04-11 DIAGNOSIS — F319 Bipolar disorder, unspecified: Secondary | ICD-10-CM | POA: Insufficient documentation

## 2019-04-11 DIAGNOSIS — S83282A Other tear of lateral meniscus, current injury, left knee, initial encounter: Secondary | ICD-10-CM | POA: Diagnosis present

## 2019-04-11 HISTORY — PX: KNEE ARTHROSCOPY WITH LATERAL MENISECTOMY: SHX6193

## 2019-04-11 HISTORY — DX: Other tear of lateral meniscus, current injury, unspecified knee, initial encounter: S83.289A

## 2019-04-11 HISTORY — DX: Bipolar disorder, unspecified: F31.9

## 2019-04-11 HISTORY — DX: Depression, unspecified: F32.A

## 2019-04-11 SURGERY — ARTHROSCOPY, KNEE, WITH LATERAL MENISCECTOMY
Anesthesia: General | Site: Knee | Laterality: Left

## 2019-04-11 MED ORDER — HYDROMORPHONE HCL 1 MG/ML IJ SOLN
INTRAMUSCULAR | Status: AC
  Filled 2019-04-11: qty 0.5

## 2019-04-11 MED ORDER — PHENYLEPHRINE 40 MCG/ML (10ML) SYRINGE FOR IV PUSH (FOR BLOOD PRESSURE SUPPORT)
PREFILLED_SYRINGE | INTRAVENOUS | Status: DC | PRN
  Administered 2019-04-11 (×3): 80 ug via INTRAVENOUS
  Administered 2019-04-11: 40 ug via INTRAVENOUS

## 2019-04-11 MED ORDER — OXYCODONE HCL 5 MG PO TABS: 5.0000 mg | ORAL_TABLET | Freq: Once | ORAL | Status: DC | PRN

## 2019-04-11 MED ORDER — MIDAZOLAM HCL 2 MG/2ML IJ SOLN
INTRAMUSCULAR | Status: DC | PRN
  Administered 2019-04-11: 2 mg via INTRAVENOUS

## 2019-04-11 MED ORDER — LACTATED RINGERS IV SOLN: INTRAVENOUS | Status: DC

## 2019-04-11 MED ORDER — SODIUM CHLORIDE 0.9 % IR SOLN
Status: DC | PRN
  Administered 2019-04-11: 3000 mL

## 2019-04-11 MED ORDER — CEFAZOLIN SODIUM-DEXTROSE 2-4 GM/100ML-% IV SOLN
INTRAVENOUS | Status: AC
  Filled 2019-04-11: qty 100

## 2019-04-11 MED ORDER — LIDOCAINE HCL (CARDIAC) PF 100 MG/5ML IV SOSY
PREFILLED_SYRINGE | INTRAVENOUS | Status: DC | PRN
  Administered 2019-04-11: 60 mg via INTRAVENOUS

## 2019-04-11 MED ORDER — OXYCODONE-ACETAMINOPHEN 5-325 MG PO TABS: 1.0000 | ORAL_TABLET | Freq: Three times a day (TID) | ORAL | 0 refills | Status: DC | PRN

## 2019-04-11 MED ORDER — BUPIVACAINE HCL (PF) 0.25 % IJ SOLN
INTRAMUSCULAR | Status: DC | PRN
  Administered 2019-04-11: 20 mL via INTRA_ARTICULAR

## 2019-04-11 MED ORDER — PROPOFOL 10 MG/ML IV BOLUS
INTRAVENOUS | Status: DC | PRN
  Administered 2019-04-11: 200 mg via INTRAVENOUS

## 2019-04-11 MED ORDER — MIDAZOLAM HCL 2 MG/2ML IJ SOLN: 1.0000 mg | INTRAMUSCULAR | Status: DC | PRN

## 2019-04-11 MED ORDER — FENTANYL CITRATE (PF) 100 MCG/2ML IJ SOLN
INTRAMUSCULAR | Status: AC
  Filled 2019-04-11: qty 2

## 2019-04-11 MED ORDER — ONDANSETRON HCL 4 MG PO TABS: 4.0000 mg | ORAL_TABLET | Freq: Three times a day (TID) | ORAL | 0 refills | Status: DC | PRN

## 2019-04-11 MED ORDER — PROMETHAZINE HCL 25 MG/ML IJ SOLN: 6.2500 mg | INTRAMUSCULAR | Status: DC | PRN

## 2019-04-11 MED ORDER — LIDOCAINE 2% (20 MG/ML) 5 ML SYRINGE
INTRAMUSCULAR | Status: AC
  Filled 2019-04-11: qty 10

## 2019-04-11 MED ORDER — HYDROMORPHONE HCL 1 MG/ML IJ SOLN
0.2500 mg | INTRAMUSCULAR | Status: DC | PRN
  Administered 2019-04-11 (×2): 0.5 mg via INTRAVENOUS

## 2019-04-11 MED ORDER — ACETAMINOPHEN 500 MG PO TABS
1000.0000 mg | ORAL_TABLET | Freq: Once | ORAL | Status: AC
  Administered 2019-04-11: 1000 mg via ORAL

## 2019-04-11 MED ORDER — KETOROLAC TROMETHAMINE 30 MG/ML IJ SOLN: 30.0000 mg | Freq: Once | INTRAMUSCULAR | Status: DC | PRN

## 2019-04-11 MED ORDER — CEFAZOLIN SODIUM-DEXTROSE 2-4 GM/100ML-% IV SOLN
2.0000 g | INTRAVENOUS | Status: AC
  Administered 2019-04-11: 2 g via INTRAVENOUS

## 2019-04-11 MED ORDER — ONDANSETRON HCL 4 MG/2ML IJ SOLN
INTRAMUSCULAR | Status: DC | PRN
  Administered 2019-04-11: 4 mg via INTRAVENOUS

## 2019-04-11 MED ORDER — BUPIVACAINE HCL (PF) 0.25 % IJ SOLN
INTRAMUSCULAR | Status: AC
  Filled 2019-04-11: qty 30

## 2019-04-11 MED ORDER — CHLORHEXIDINE GLUCONATE 4 % EX LIQD: 60.0000 mL | Freq: Once | CUTANEOUS | Status: DC

## 2019-04-11 MED ORDER — FENTANYL CITRATE (PF) 100 MCG/2ML IJ SOLN
INTRAMUSCULAR | Status: DC | PRN
  Administered 2019-04-11 (×3): 50 ug via INTRAVENOUS

## 2019-04-11 MED ORDER — ACETAMINOPHEN 500 MG PO TABS
ORAL_TABLET | ORAL | Status: AC
  Filled 2019-04-11: qty 2

## 2019-04-11 MED ORDER — DEXAMETHASONE SODIUM PHOSPHATE 10 MG/ML IJ SOLN
INTRAMUSCULAR | Status: AC
  Filled 2019-04-11: qty 1

## 2019-04-11 MED ORDER — FENTANYL CITRATE (PF) 100 MCG/2ML IJ SOLN: 50.0000 ug | INTRAMUSCULAR | Status: DC | PRN

## 2019-04-11 MED ORDER — BUPIVACAINE HCL (PF) 0.5 % IJ SOLN
INTRAMUSCULAR | Status: AC
  Filled 2019-04-11: qty 30

## 2019-04-11 MED ORDER — PROPOFOL 10 MG/ML IV BOLUS
INTRAVENOUS | Status: AC
  Filled 2019-04-11: qty 20

## 2019-04-11 MED ORDER — OXYCODONE HCL 5 MG/5ML PO SOLN: 5.0000 mg | Freq: Once | ORAL | Status: DC | PRN

## 2019-04-11 MED ORDER — LACTATED RINGERS IV SOLN
INTRAVENOUS | Status: DC
  Administered 2019-04-11 (×2): via INTRAVENOUS

## 2019-04-11 MED ORDER — ONDANSETRON HCL 4 MG/2ML IJ SOLN
INTRAMUSCULAR | Status: AC
  Filled 2019-04-11: qty 2

## 2019-04-11 MED ORDER — DEXAMETHASONE SODIUM PHOSPHATE 10 MG/ML IJ SOLN
INTRAMUSCULAR | Status: DC | PRN
  Administered 2019-04-11: 6 mg via INTRAVENOUS

## 2019-04-11 MED ORDER — MIDAZOLAM HCL 2 MG/2ML IJ SOLN
INTRAMUSCULAR | Status: AC
  Filled 2019-04-11: qty 2

## 2019-04-11 MED ORDER — HYDROCODONE-ACETAMINOPHEN 5-325 MG PO TABS: 1.0000 | ORAL_TABLET | Freq: Three times a day (TID) | ORAL | 0 refills | Status: DC | PRN

## 2019-04-11 SURGICAL SUPPLY — 43 items
BANDAGE ESMARK 6X9 LF (GAUZE/BANDAGES/DRESSINGS) IMPLANT
BLADE CUDA GRT WHITE 3.5 (BLADE) IMPLANT
BLADE GREAT WHITE 4.2 (BLADE) IMPLANT
BLADE GREAT WHITE 4.2MM (BLADE)
BLADE LANZA CVD 15 DEG (BLADE) IMPLANT
BLADE SHAVER TORPEDO 4X13 (MISCELLANEOUS) ×1 IMPLANT
BNDG CMPR 9X6 STRL LF SNTH (GAUZE/BANDAGES/DRESSINGS)
BNDG ELASTIC 6X5.8 VLCR STR LF (GAUZE/BANDAGES/DRESSINGS) ×6 IMPLANT
BNDG ESMARK 6X9 LF (GAUZE/BANDAGES/DRESSINGS)
COVER WAND RF STERILE (DRAPES) IMPLANT
CUFF TOURN SGL QUICK 34 (TOURNIQUET CUFF) ×3
CUFF TRNQT CYL 34X4.125X (TOURNIQUET CUFF) ×1 IMPLANT
DISSECTOR 3.5MM X 13CM CVD (MISCELLANEOUS) ×2 IMPLANT
DRAPE ARTHROSCOPY W/POUCH 90 (DRAPES) ×3 IMPLANT
DRAPE IMP U-DRAPE 54X76 (DRAPES) ×3 IMPLANT
DRAPE U-SHAPE 47X51 STRL (DRAPES) ×3 IMPLANT
DURAPREP 26ML APPLICATOR (WOUND CARE) ×3 IMPLANT
GAUZE SPONGE 4X4 12PLY STRL (GAUZE/BANDAGES/DRESSINGS) ×3 IMPLANT
GAUZE XEROFORM 1X8 LF (GAUZE/BANDAGES/DRESSINGS) ×3 IMPLANT
GLOVE BIOGEL PI IND STRL 7.0 (GLOVE) ×1 IMPLANT
GLOVE BIOGEL PI INDICATOR 7.0 (GLOVE) ×8
GLOVE ECLIPSE 6.5 STRL STRAW (GLOVE) ×4 IMPLANT
GLOVE ECLIPSE 7.0 STRL STRAW (GLOVE) ×3 IMPLANT
GLOVE SKINSENSE NS SZ7.5 (GLOVE) ×2
GLOVE SKINSENSE STRL SZ7.5 (GLOVE) ×1 IMPLANT
GLOVE SURG SYN 7.5  E (GLOVE) ×2
GLOVE SURG SYN 7.5 E (GLOVE) ×1 IMPLANT
GLOVE SURG SYN 7.5 PF PI (GLOVE) ×1 IMPLANT
GOWN STRL REIN XL XLG (GOWN DISPOSABLE) ×3 IMPLANT
GOWN STRL REUS W/ TWL LRG LVL3 (GOWN DISPOSABLE) ×1 IMPLANT
GOWN STRL REUS W/ TWL XL LVL3 (GOWN DISPOSABLE) ×1 IMPLANT
GOWN STRL REUS W/TWL LRG LVL3 (GOWN DISPOSABLE) ×9
GOWN STRL REUS W/TWL XL LVL3 (GOWN DISPOSABLE) ×3
KNEE WRAP E Z 3 GEL PACK (MISCELLANEOUS) ×3 IMPLANT
MANIFOLD NEPTUNE II (INSTRUMENTS) ×3 IMPLANT
PACK ARTHROSCOPY DSU (CUSTOM PROCEDURE TRAY) ×3 IMPLANT
PACK BASIN DAY SURGERY FS (CUSTOM PROCEDURE TRAY) ×3 IMPLANT
SHAVER 4.2 MM LANZA 9391A (BLADE) ×1 IMPLANT
SUT ETHILON 3 0 PS 1 (SUTURE) ×3 IMPLANT
TAPE STRIPS DRAPE STRL (GAUZE/BANDAGES/DRESSINGS) ×2 IMPLANT
TOWEL GREEN STERILE FF (TOWEL DISPOSABLE) ×3 IMPLANT
TUBING ARTHRO INFLOW-ONLY STRL (TUBING) ×3 IMPLANT
TUBING ARTHROSCOPY IRRIG 16FT (MISCELLANEOUS) IMPLANT

## 2019-04-11 NOTE — Transfer of Care (Signed)
Immediate Anesthesia Transfer of Care Note  Patient: Brandi Collins  Procedure(s) Performed: LEFT KNEE ARTHROSCOPY WITH PARTIAL LATERAL MENISECTOMY (Left Knee)  Patient Location: PACU  Anesthesia Type:General  Level of Consciousness: drowsy and patient cooperative  Airway & Oxygen Therapy: Patient Spontanous Breathing and Patient connected to face mask oxygen  Post-op Assessment: Report given to RN and Post -op Vital signs reviewed and stable  Post vital signs: Reviewed and stable  Last Vitals:  Vitals Value Taken Time  BP 116/71 04/11/19 1239  Temp    Pulse 55 04/11/19 1240  Resp 9 04/11/19 1240  SpO2 99 % 04/11/19 1240  Vitals shown include unvalidated device data.  Last Pain:  Vitals:   04/11/19 0943  TempSrc: Oral  PainSc: 8          Complications: No apparent anesthesia complications

## 2019-04-11 NOTE — Op Note (Signed)
° °  Surgery Date: 04/11/2019  Surgeon(s): Leandrew Koyanagi, MD  ASSIST: Madalyn Rob, [place]; necessary for the timely completion of procedure and due to complexity of procedure.  ANESTHESIA:  general  FLUIDS: Per anesthesia record.   ESTIMATED BLOOD LOSS: minimal  PREOPERATIVE DIAGNOSES:  1. Left knee lateral meniscus tear 2. Left knee synovitis 3. Left knee chondromalacia  POSTOPERATIVE DIAGNOSES:  same  PROCEDURES PERFORMED:  1. Left knee arthroscopy with major synovectomy 2. Left knee arthroscopy with arthroscopic partial lateral meniscectomy 3. Left knee arthroscopy with arthroscopic chondroplasty lateral femoral condyle and lateral tibial plateau and patella  DESCRIPTION OF PROCEDURE: Brandi Collins is a 53 y.o.-year-old female with left knee lateral meniscus tear. Plans are to proceed with partial lateral meniscectomy and diagnostic arthroscopy with debridement as indicated. Full discussion held regarding risks benefits alternatives and complications related surgical intervention. Conservative care options reviewed. All questions answered.  The patient was identified in the preoperative holding area and the operative extremity was marked. The patient was brought to the operating room and transferred to operating table in a supine position. Satisfactory general anesthesia was induced by anesthesiology.    Standard anterolateral, anteromedial arthroscopy portals were obtained. The anteromedial portal was obtained with a spinal needle for localization under direct visualization with subsequent diagnostic findings.   Incisions were made for arthroscopic portals.  Diagnostic knee arthroscopy was performed as well as a major synovectomy in all 3 compartments.  She did exhibit moderate synovitis that was worst in the suprapatellar pouch.  We then positioned the arthroscope in the medial compartment and with a valgus stress the medial compartment was evaluated and it was unremarkable.   The cruciate ligaments were unremarkable.  The knee was then placed in a figure 4 position to address the lateral compartment.  She did have grade 3 chondromalacia of both the lateral femoral condyle and lateral tibial plateau.  She also had a radial tear of the mid body of the lateral meniscus for which a partial lateral meniscectomy was performed using meniscus basket and oscillating shaver back to a stable border.  Chondroplasty was performed for the lateral compartment.  The knee was then placed in full extension.  The gutters were checked for loose bodies.  Synovectomy was performed for the suprapatellar synovitis.  The femoral trochlea was unremarkable but the patellar chondral surface exhibited grade III chondromalacia.  Excess fluid was then removed from the knee joint.  Incisions were closed with interrupted nylon sutures.  Sterile dressings were applied.  Patient tolerated the procedure well had no immediate complications.  Suprapatellar pouch and gutters: moderate synovitis or debris. Patella chondral surface: Grade 3 Trochlear chondral surface: Grade 1 Patellofemoral tracking: normal Medial meniscus: normal.  Medial femoral condyle weight bearing surface: Grade 0 Medial tibial plateau: Grade 0 Anterior cruciate ligament:stable Posterior cruciate ligament:stable Lateral meniscus: radial tear.   Lateral femoral condyle weight bearing surface: Grade 3 Lateral tibial plateau: Grade 3  DISPOSITION: The patient was awakened from general anesthetic, extubated, taken to the recovery room in medically stable condition, no apparent complications. The patient may be weightbearing as tolerated to the operative lower extremity.  Range of motion of right knee as tolerated.  Brandi Cecil, MD [facility] 12:31 PM

## 2019-04-11 NOTE — Anesthesia Procedure Notes (Signed)
Procedure Name: LMA Insertion Date/Time: 04/11/2019 11:44 AM Performed by: Raenette Rover, CRNA Pre-anesthesia Checklist: Patient identified, Emergency Drugs available, Suction available and Patient being monitored Patient Re-evaluated:Patient Re-evaluated prior to induction Oxygen Delivery Method: Circle system utilized Preoxygenation: Pre-oxygenation with 100% oxygen Induction Type: IV induction LMA: LMA inserted LMA Size: 4.0 Number of attempts: 1 Placement Confirmation: positive ETCO2 and breath sounds checked- equal and bilateral Tube secured with: Tape Dental Injury: Teeth and Oropharynx as per pre-operative assessment

## 2019-04-11 NOTE — Anesthesia Postprocedure Evaluation (Signed)
Anesthesia Post Note  Patient: Kawanda A Sangiovanni  Procedure(s) Performed: LEFT KNEE ARTHROSCOPY WITH PARTIAL LATERAL MENISECTOMY (Left Knee)     Patient location during evaluation: PACU Anesthesia Type: General Level of consciousness: awake and alert Pain management: pain level controlled Vital Signs Assessment: post-procedure vital signs reviewed and stable Respiratory status: spontaneous breathing, nonlabored ventilation, respiratory function stable and patient connected to nasal cannula oxygen Cardiovascular status: blood pressure returned to baseline and stable Postop Assessment: no apparent nausea or vomiting Anesthetic complications: no    Last Vitals:  Vitals:   04/11/19 1345 04/11/19 1440  BP: (!) 147/90 134/85  Pulse: 73 66  Resp: 14 18  Temp:  36.8 C  SpO2: 97% 95%    Last Pain:  Vitals:   04/11/19 1440  TempSrc:   PainSc: 1                  Ryan P Ellender

## 2019-04-11 NOTE — Anesthesia Preprocedure Evaluation (Addendum)
Anesthesia Evaluation  Patient identified by MRN, date of birth, ID band Patient awake    Reviewed: Allergy & Precautions, NPO status , Patient's Chart, lab work & pertinent test results  Airway Mallampati: III  TM Distance: >3 FB Neck ROM: Full    Dental  (+) Poor Dentition, Loose,    Pulmonary neg pulmonary ROS,    Pulmonary exam normal breath sounds clear to auscultation       Cardiovascular negative cardio ROS Normal cardiovascular exam Rhythm:Regular Rate:Normal     Neuro/Psych PSYCHIATRIC DISORDERS Depression Bipolar Disorder ADHD (attention deficit hyperactivity disorder)negative neurological ROS     GI/Hepatic negative GI ROS, Neg liver ROS,   Endo/Other  negative endocrine ROS  Renal/GU negative Renal ROS     Musculoskeletal negative musculoskeletal ROS (+)   Abdominal   Peds  Hematology negative hematology ROS (+)   Anesthesia Other Findings left knee lateral meniscal tear  Reproductive/Obstetrics                            Anesthesia Physical Anesthesia Plan  ASA: II  Anesthesia Plan: General   Post-op Pain Management:    Induction: Intravenous  PONV Risk Score and Plan: 3 and Ondansetron, Dexamethasone, Midazolam and Treatment may vary due to age or medical condition  Airway Management Planned: LMA  Additional Equipment:   Intra-op Plan:   Post-operative Plan: Extubation in OR  Informed Consent: I have reviewed the patients History and Physical, chart, labs and discussed the procedure including the risks, benefits and alternatives for the proposed anesthesia with the patient or authorized representative who has indicated his/her understanding and acceptance.     Dental advisory given  Plan Discussed with: CRNA  Anesthesia Plan Comments:        Anesthesia Quick Evaluation

## 2019-04-11 NOTE — Discharge Instructions (Signed)
°Post Anesthesia Home Care Instructions ° °Activity: °Get plenty of rest for the remainder of the day. A responsible individual must stay with you for 24 hours following the procedure.  °For the next 24 hours, DO NOT: °-Drive a car °-Operate machinery °-Drink alcoholic beverages °-Take any medication unless instructed by your physician °-Make any legal decisions or sign important papers. ° °Meals: °Start with liquid foods such as gelatin or soup. Progress to regular foods as tolerated. Avoid greasy, spicy, heavy foods. If nausea and/or vomiting occur, drink only clear liquids until the nausea and/or vomiting subsides. Call your physician if vomiting continues. ° °Special Instructions/Symptoms: °Your throat may feel dry or sore from the anesthesia or the breathing tube placed in your throat during surgery. If this causes discomfort, gargle with warm salt water. The discomfort should disappear within 24 hours. ° °If you had a scopolamine patch placed behind your ear for the management of post- operative nausea and/or vomiting: ° °1. The medication in the patch is effective for 72 hours, after which it should be removed.  Wrap patch in a tissue and discard in the trash. Wash hands thoroughly with soap and water. °2. You may remove the patch earlier than 72 hours if you experience unpleasant side effects which may include dry mouth, dizziness or visual disturbances. °3. Avoid touching the patch. Wash your hands with soap and water after contact with the patch. °  ° ° ° ° ° ° °Post-operative patient instructions  °Knee Arthroscopy  ° °• Ice:  Place intermittent ice or cooler pack over your knee, 30 minutes on and 30 minutes off.  Continue this for the first 72 hours after surgery, then save ice for use after therapy sessions or on more active days.   °• Weight:  You may bear weight on your leg as your symptoms allow. °• Crutches:  Use crutches (or walker) to assist in walking until told to discontinue by your physical  therapist or physician. This will help to reduce pain. °• Strengthening:  Perform simple thigh squeezes (isometric quad contractions) and straight leg lifts as you are able (3 sets of 5 to 10 repetitions, 3 times a day).  For the leg lifts, have someone support under your ankle in the beginning until you have increased strength enough to do this on your own.  To help get started on thigh squeezes, place a pillow under your knee and push down on the pillow with back of knee (sometimes easier to do than with your leg fully straight). °• Motion:  Perform gentle knee motion as tolerated - this is gentle bending and straightening of the knee. Seated heel slides: you can start by sitting in a chair, remove your brace, and gently slide your heel back on the floor - allowing your knee to bend. Have someone help you straighten your knee (or use your other leg/foot hooked under your ankle.  °• Dressing:  Perform 1st dressing change at 2 days postoperative. A moderate amount of blood tinged drainage is to be expected.  So if you bleed through the dressing on the first or second day or if you have fevers, it is fine to change the dressing/check the wounds early and redress wound. Elevate your leg.  If it bleeds through again, or if the incisions are leaking frank blood, please call the office. May change dressing every 1-2 days thereafter to help watch wounds. Can purchase Tegaderm (or 3M Nexcare) water resistant dressings at local pharmacy / [employer]. °• Shower:    Light shower is ok after 2 days.  Please take shower, NO bath. Recover with gauze and ace wrap to help keep wounds protected.   °• Pain medication:  A narcotic pain medication has been prescribed.  Take as directed.  Typically you need narcotic pain medication more regularly during the first 3 to 5 days after surgery.  Decrease your use of the medication as the pain improves.  Narcotics can sometimes cause constipation, even after a few doses.  If you have problems  with constipation, you can take an over the counter stool softener or light laxative.  If you have persistent problems, please notify your physician’s office. °• Physical therapy: Additional activity guidelines to be provided by your physician or physical therapist at follow-up visits.  °• Driving: Do not recommend driving x 2 weeks post surgical, especially if surgery performed on right side. Should not drive while taking narcotic pain medications. It typically takes at least 2 weeks to restore sufficient neuromuscular function for normal reaction times for driving safety.  °• Call [phone] for questions or problems. Evenings you will be forwarded to the hospital operator.  Ask for the orthopaedic physician on call. Please call if you experience:  °  °o Redness, foul smelling, or persistent drainage from the surgical site  °o worsening knee pain and swelling not responsive to medication  °o any calf pain and or swelling of the lower leg  °o temperatures greater than 101.5 F °o other questions or concerns ° ° °Thank you for allowing us to be a part of your care. ° °

## 2019-04-11 NOTE — H&P (Signed)
PREOPERATIVE H&P  Chief Complaint: left knee lateral meniscal tear  HPI: Brandi Collins is a 53 y.o. female who presents for surgical treatment of left knee lateral meniscal tear.  She denies any changes in medical history.  Past Medical History:  Diagnosis Date  . ADHD (attention deficit hyperactivity disorder)   . Bipolar disorder (HCC)   . Depression   . Lateral meniscus tear    left   Past Surgical History:  Procedure Laterality Date  . THROAT SURGERY     patient was 53 years old.    Social History   Socioeconomic History  . Marital status: Divorced    Spouse name: Not on file  . Number of children: Not on file  . Years of education: Not on file  . Highest education level: Not on file  Occupational History  . Not on file  Social Needs  . Financial resource strain: Not on file  . Food insecurity    Worry: Not on file    Inability: Not on file  . Transportation needs    Medical: Not on file    Non-medical: Not on file  Tobacco Use  . Smoking status: Never Smoker  . Smokeless tobacco: Never Used  Substance and Sexual Activity  . Alcohol use: No  . Drug use: Yes    Types: Marijuana    Comment: last used marijuana 2 days ago  . Sexual activity: Not on file  Lifestyle  . Physical activity    Days per week: Not on file    Minutes per session: Not on file  . Stress: Not on file  Relationships  . Social Musician on phone: Not on file    Gets together: Not on file    Attends religious service: Not on file    Active member of club or organization: Not on file    Attends meetings of clubs or organizations: Not on file    Relationship status: Not on file  Other Topics Concern  . Not on file  Social History Narrative  . Not on file   Family History  Problem Relation Age of Onset  . Hypertension Mother   . Heart disease Mother   . Stroke Mother   . Hypertension Daughter    No Known Allergies Prior to Admission medications   Medication  Sig Start Date End Date Taking? Authorizing Provider  diclofenac (VOLTAREN) 75 MG EC tablet Take 1 tablet (75 mg total) by mouth 2 (two) times daily. 12/04/18  Yes Hagler, Arlys John, MD  HYDROcodone-acetaminophen (NORCO) 7.5-325 MG tablet Take 1 tablet by mouth every 6 (six) hours as needed for moderate pain. 03/28/19  Yes Kerrin Champagne, MD  naproxen (NAPROSYN) 500 MG tablet Take 1 tablet (500 mg total) by mouth 2 (two) times daily with a meal. 02/05/19  Yes Kerrin Champagne, MD  oxcarbazepine (TRILEPTAL) 600 MG tablet Take 600 mg by mouth 2 (two) times daily.   Yes [provider]     Positive ROS: All other systems have been reviewed and were otherwise negative with the exception of those mentioned in the HPI and as above.  Physical Exam: General: Alert, no acute distress Cardiovascular: No pedal edema Respiratory: No cyanosis, no use of accessory musculature GI: abdomen soft Skin: No lesions in the area of chief complaint Neurologic: Sensation intact distally Psychiatric: Patient is competent for consent with normal mood and affect Lymphatic: no lymphedema  MUSCULOSKELETAL: exam stable  Assessment: left  knee lateral meniscal tear  Plan: Plan for Procedure(s): LEFT KNEE ARTHROSCOPY WITH PARTIAL LATERAL MENISCECTOMY  The risks benefits and alternatives were discussed with the patient including but not limited to the risks of nonoperative treatment, versus surgical intervention including infection, bleeding, nerve injury,  blood clots, cardiopulmonary complications, morbidity, mortality, among others, and they were willing to proceed.   Eduard Roux, MD   04/11/2019 8:19 AM

## 2019-04-14 ENCOUNTER — Encounter (HOSPITAL_BASED_OUTPATIENT_CLINIC_OR_DEPARTMENT_OTHER): Payer: Self-pay | Admitting: Orthopaedic Surgery

## 2019-04-22 ENCOUNTER — Encounter: Payer: Self-pay | Admitting: Physician Assistant

## 2019-04-22 ENCOUNTER — Ambulatory Visit (INDEPENDENT_AMBULATORY_CARE_PROVIDER_SITE_OTHER): Payer: Self-pay | Admitting: Physician Assistant

## 2019-04-22 ENCOUNTER — Other Ambulatory Visit: Payer: Self-pay

## 2019-04-22 DIAGNOSIS — Z9889 Other specified postprocedural states: Secondary | ICD-10-CM

## 2019-04-22 MED ORDER — OXYCODONE-ACETAMINOPHEN 5-325 MG PO TABS: 1.0000 | ORAL_TABLET | Freq: Two times a day (BID) | ORAL | 0 refills | Status: DC | PRN

## 2019-04-22 NOTE — Progress Notes (Signed)
Post-Op Visit Note   Patient: Brandi Collins           Date of Birth: [birth year]           MRN: [id number] Visit Date: 04/22/2019 PCP: Quentin Angst, MD   Assessment & Plan:  Chief Complaint:  Chief Complaint  Patient presents with  . Left Knee - Pain, Follow-up   Visit Diagnoses:  1. S/P left knee arthroscopy     Plan: Patient is a pleasant 53 year old female who presents our clinic today 11 days status post left knee arthroscopic debridement partial meniscectomy, date of surgery 04/11/2019.  It was noted during operative intervention that she had grade 3 changes to the patella chondral surface as well as to the lateral femoral condyle and lateral tibial plateau.  She has been in a fair amount of pain over the past week.  She notes that she may have overdone things a few days after surgery which may have led to the increase in pain.  No fevers or chills.  Examination of her left knee reveals well-healing surgical portals.  She notes that she accidentally pulled out the medial portal suture.  Both portals look to be well-healed.  She does have a small effusion.  Her calf is soft and nontender.  She is neurovascularly intact distally.  Today, the lateral nylon suture was removed.  Steri-Strips applied.  We will start her in outpatient formal physical therapy and an internal prescription has been sent in.  She has asked for Percocet so I have provided 1 small prescription.  She will follow-up with [place] in 4 weeks time for recheck.  Call with concerns or questions in the meantime.  Follow-Up Instructions: Return in about 4 weeks (around 05/20/2019).   Orders:  Orders Placed This Encounter  Procedures  . Ambulatory referral to Physical Therapy   Meds ordered this encounter  Medications  . oxyCODONE-acetaminophen (PERCOCET) 5-325 MG tablet    Sig: Take 1-2 tablets by mouth 2 (two) times daily as needed for severe pain.    Dispense:  20 tablet    Refill:  0    Imaging: No new  imaging   PMFS History: Patient Active Problem List   Diagnosis Date Noted  . Synovitis of knee   . Acute lateral meniscus tear of left knee 04/08/2019  . Screening examination for pulmonary tuberculosis 05/05/2016  . Acute bilateral low back pain without sciatica 03/27/2016  . Health care maintenance 04/22/2015  . Essential hypertension, benign 04/22/2015  . Annual physical exam 04/28/2013  . Pap smear for cervical cancer screening 04/28/2013  . 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 High blood pressure 04/28/2013   Past Medical History:  Diagnosis Date  . ADHD (attention deficit hyperactivity disorder)   . Bipolar disorder (HCC)   . Depression   . Lateral meniscus tear    left    Family History  Problem Relation Age of Onset  . Hypertension Mother   . Heart disease Mother   . Stroke Mother   . Hypertension Daughter     Past Surgical History:  Procedure Laterality Date  . KNEE ARTHROSCOPY WITH LATERAL MENISECTOMY Left 04/11/2019   Procedure: LEFT KNEE ARTHROSCOPY WITH PARTIAL LATERAL MENISECTOMY;  Surgeon: Tarry Kos, MD;  Location: [facility];  Service: Orthopedics;  Laterality: Left;  . THROAT SURGERY     patient was 53 years old.    Social History   Occupational History  . Not on file  Tobacco  Use  . Smoking status: Never Smoker  . Smokeless tobacco: Never Used  Substance and Sexual Activity  . Alcohol use: No  . Drug use: Yes    Types: Marijuana    Comment: last used marijuana 2 days ago  . Sexual activity: Not on file

## 2019-04-24 ENCOUNTER — Ambulatory Visit: Payer: Self-pay

## 2019-04-25 ENCOUNTER — Ambulatory Visit: Payer: Self-pay | Attending: Physician Assistant | Admitting: Physical Therapy

## 2019-05-27 ENCOUNTER — Other Ambulatory Visit: Payer: Self-pay

## 2019-05-27 ENCOUNTER — Ambulatory Visit (INDEPENDENT_AMBULATORY_CARE_PROVIDER_SITE_OTHER): Payer: Self-pay | Admitting: Orthopaedic Surgery

## 2019-05-27 DIAGNOSIS — Z9889 Other specified postprocedural states: Secondary | ICD-10-CM

## 2019-05-27 MED ORDER — METHYLPREDNISOLONE ACETATE 40 MG/ML IJ SUSP
40.0000 mg | INTRAMUSCULAR | Status: AC | PRN
  Administered 2019-05-27: 40 mg via INTRA_ARTICULAR

## 2019-05-27 MED ORDER — BUPIVACAINE HCL 0.25 % IJ SOLN
2.0000 mL | INTRAMUSCULAR | Status: AC | PRN
  Administered 2019-05-27: 14:00:00 2 mL via INTRA_ARTICULAR

## 2019-05-27 MED ORDER — LIDOCAINE HCL 1 % IJ SOLN
2.0000 mL | INTRAMUSCULAR | Status: AC | PRN
  Administered 2019-05-27: 14:00:00 2 mL

## 2019-05-27 NOTE — Progress Notes (Signed)
Procedure Note  Patient: Brandi Collins             Date of Birth: Jun 18, 1965           MRN: [id number]             Visit Date: 05/27/2019  Procedures: Visit Diagnoses:  1. S/P left knee arthroscopy     Large Joint Inj: L knee on 05/27/2019 2:12 PM Indications: pain Details: 22 G needle, anterolateral approach Medications: 2 mL lidocaine 1 %; 2 mL bupivacaine 0.25 %; 40 mg methylPREDNISolone acetate 40 MG/ML       Post-Op Visit Note   Patient: Brandi Collins           Date of Birth: 11-07-1965           MRN: [id number] Visit Date: 05/27/2019 PCP: Tresa Garter, MD   Assessment & Plan:  Chief Complaint:  Chief Complaint  Patient presents with  . Left Knee - Routine Post Op   Visit Diagnoses:  1. S/P left knee arthroscopy     Plan: Patient is a pleasant 54 year old female who presents to our clinic today 6-1/2 weeks status post left knee arthroscopic debridement lateral meniscus and chondroplasty.  Was noted operative mention she had grade 3 changes to the patellofemoral and lateral compartments.  She comes in today for follow-up.  She has regained a fair amount of range of motion but has continued pain throughout the entire knee.  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  Examination of the left knee reveals a trace effusion.  Range of motion 0 to 130 degrees.  Mild medial lateral joint line tenderness.  Moderate patellofemoral crepitus.  She is neurovascular intact distally.  At this point, believe the majority the patient's symptoms are coming from her underlying arthritis.  We will inject the left knee with cortisone today.  She will follow up with [place] as needed.  Follow-Up Instructions: Return if symptoms worsen or fail to improve.   Orders:  Orders Placed This Encounter  Procedures  . Large Joint Inj: L knee   No orders of the defined types were placed in this encounter.   Imaging: No new imaging  PMFS History: Patient Active Problem  List   Diagnosis Date Noted  . Synovitis of knee   . Acute lateral meniscus tear of left knee 04/08/2019  . Screening examination for pulmonary tuberculosis 05/05/2016  . Acute bilateral low back pain without sciatica 03/27/2016  . Health care maintenance 04/22/2015  . Essential hypertension, benign 04/22/2015  . Annual physical exam 04/28/2013  . Pap smear for cervical cancer screening 04/28/2013  . 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 High blood pressure 04/28/2013   Past Medical History:  Diagnosis Date  . ADHD (attention deficit hyperactivity disorder)   . Bipolar disorder ([place])   . Depression   . Lateral meniscus tear    left    Family History  Problem Relation Age of Onset  . Hypertension Mother   . Heart disease Mother   . Stroke Mother   . Hypertension Daughter     Past Surgical History:  Procedure Laterality Date  . KNEE ARTHROSCOPY WITH LATERAL MENISECTOMY Left 04/11/2019   Procedure: LEFT KNEE ARTHROSCOPY WITH PARTIAL LATERAL MENISECTOMY;  Surgeon: Leandrew Koyanagi, MD;  Location: [facility];  Service: Orthopedics;  Laterality: Left;  . THROAT SURGERY     patient was 54 years old.    Social  History   Occupational History  . Not on file  Tobacco Use  . Smoking status: Never Smoker  . Smokeless tobacco: Never Used  Substance and Sexual Activity  . Alcohol use: No  . Drug use: Yes    Types: Marijuana    Comment: last used marijuana 2 days ago  . Sexual activity: Not on file

## 2019-07-22 IMAGING — DX LEFT KNEE - COMPLETE 4+ VIEW
5 series · 5 of 5 positions shown · non-contrast
Comparison: None.

CLINICAL DATA: Fall 1-2 months ago.  Continued knee pain

EXAM:
LEFT KNEE - COMPLETE 4+ VIEW

[knee ap]
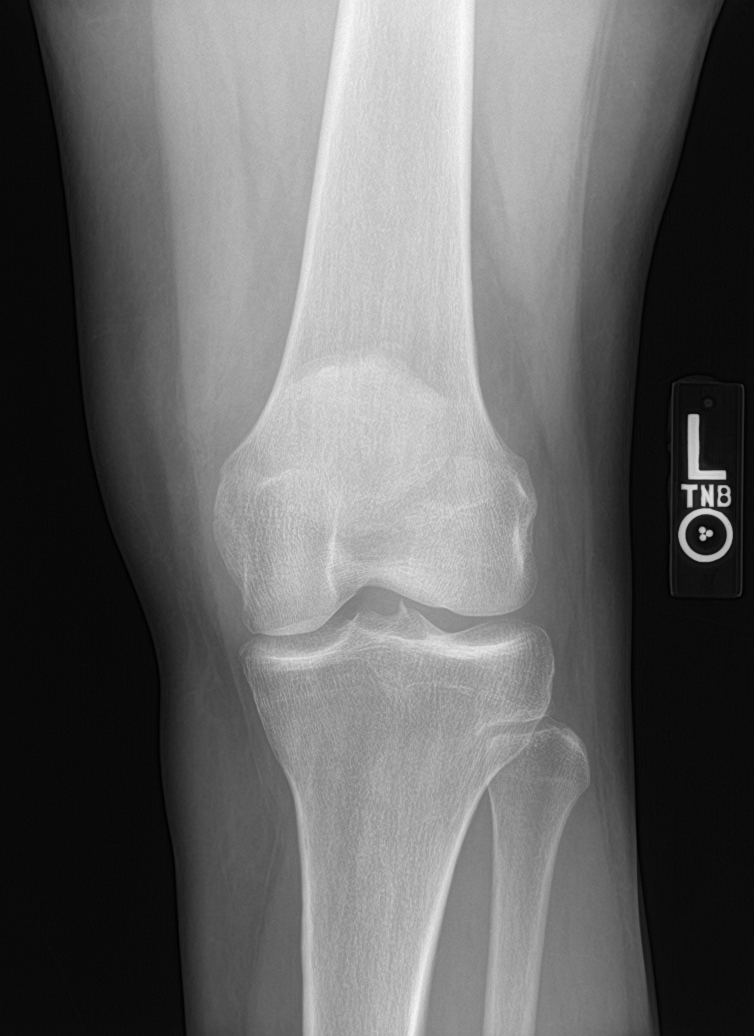

[knee obl (1 of 2)]
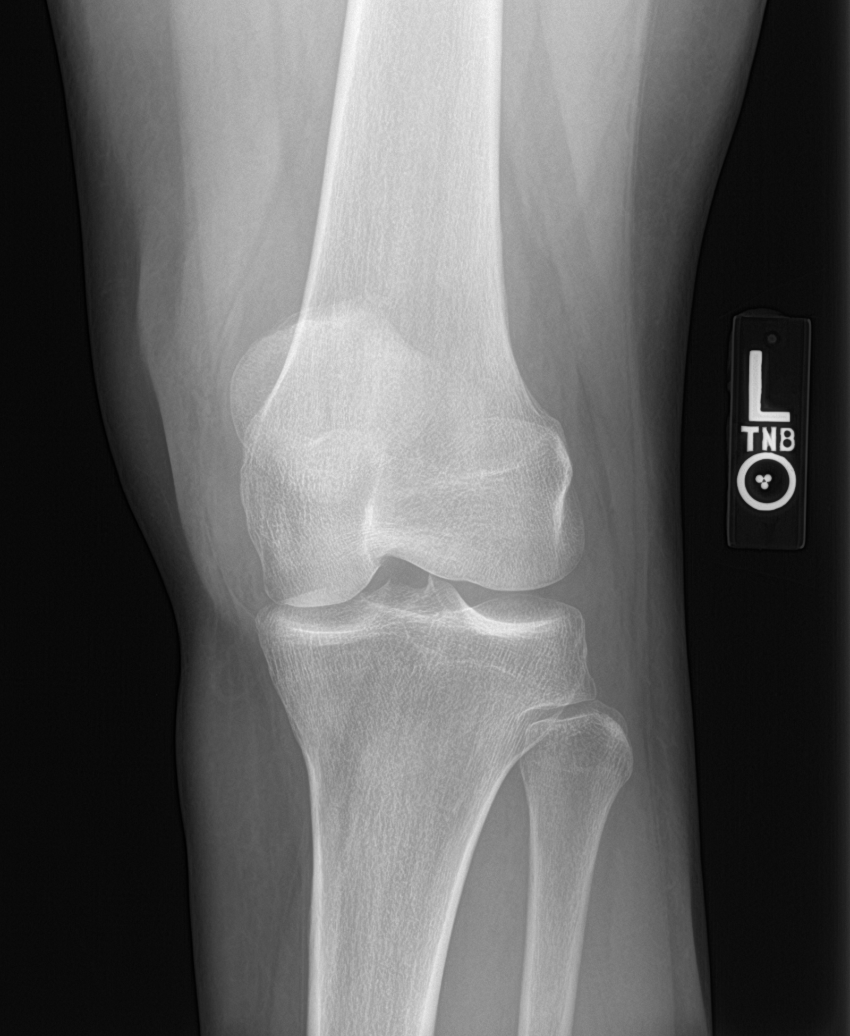

[knee obl (2 of 2)]
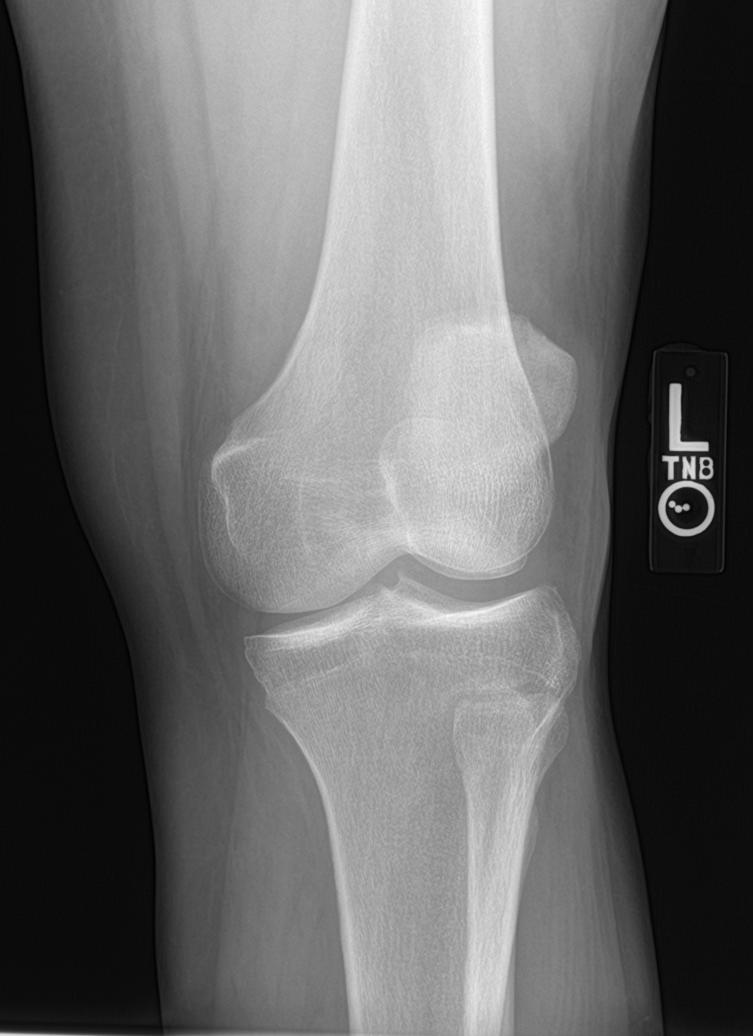

[knee lat]
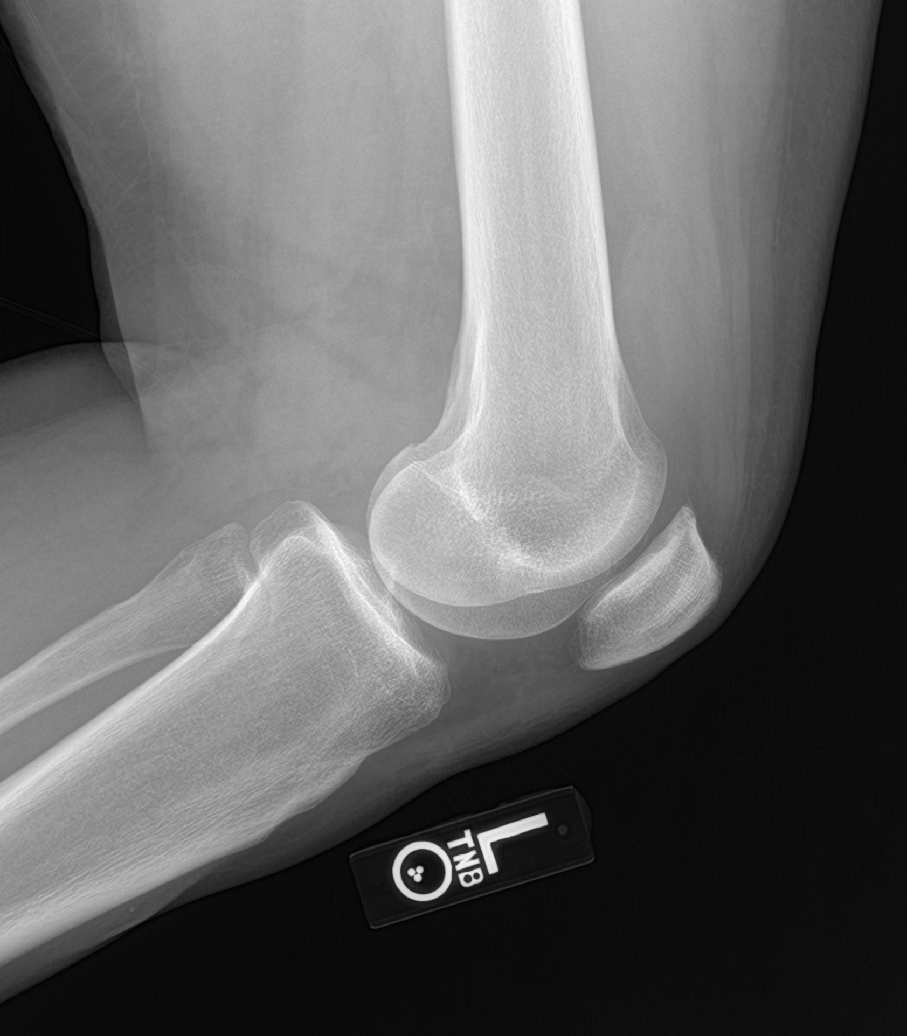

[knee sunrise]
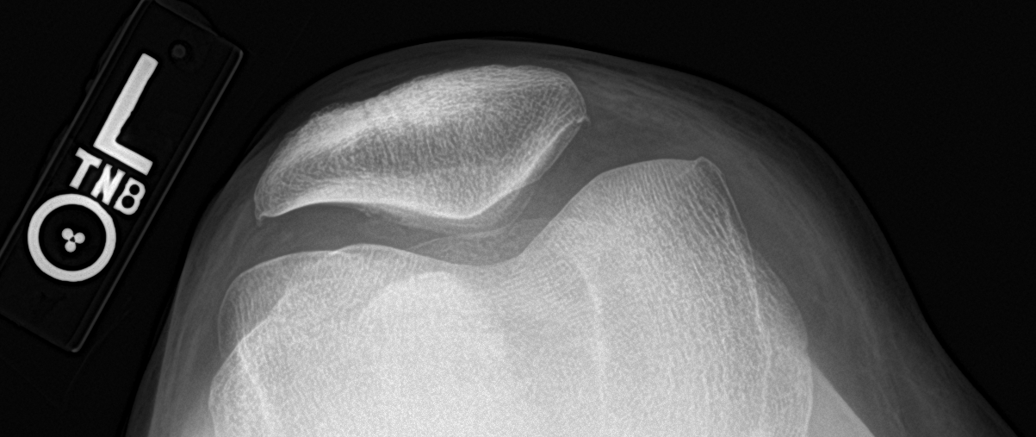

[5 of 5 positions shown; findings below may reference images not displayed]

FINDINGS: No evidence of fracture, dislocation, or joint effusion. No evidence
of arthropathy or other focal bone abnormality. Soft tissues are
unremarkable.
IMPRESSION: Negative.

## 2019-09-24 ENCOUNTER — Ambulatory Visit (INDEPENDENT_AMBULATORY_CARE_PROVIDER_SITE_OTHER): Payer: Self-pay | Admitting: Orthopaedic Surgery

## 2019-09-24 ENCOUNTER — Ambulatory Visit (INDEPENDENT_AMBULATORY_CARE_PROVIDER_SITE_OTHER): Payer: Self-pay

## 2019-09-24 ENCOUNTER — Telehealth: Payer: Self-pay

## 2019-09-24 ENCOUNTER — Encounter: Payer: Self-pay | Admitting: Orthopaedic Surgery

## 2019-09-24 ENCOUNTER — Other Ambulatory Visit: Payer: Self-pay

## 2019-09-24 DIAGNOSIS — M79671 Pain in right foot: Secondary | ICD-10-CM

## 2019-09-24 DIAGNOSIS — M1712 Unilateral primary osteoarthritis, left knee: Secondary | ICD-10-CM

## 2019-09-24 MED ORDER — LIDOCAINE HCL 1 % IJ SOLN
2.0000 mL | INTRAMUSCULAR | Status: AC | PRN
  Administered 2019-09-24: 2 mL

## 2019-09-24 MED ORDER — BUPIVACAINE HCL 0.25 % IJ SOLN
2.0000 mL | INTRAMUSCULAR | Status: AC | PRN
  Administered 2019-09-24: 2 mL via INTRA_ARTICULAR

## 2019-09-24 MED ORDER — METHYLPREDNISOLONE ACETATE 40 MG/ML IJ SUSP
40.0000 mg | INTRAMUSCULAR | Status: AC | PRN
  Administered 2019-09-24: 40 mg via INTRA_ARTICULAR

## 2019-09-24 NOTE — Progress Notes (Signed)
Office Visit Note   Patient: Brandi Collins           Date of Birth: 1966-03-26           MRN: [id number] Visit Date: 09/24/2019              Requested by: Tresa Garter, MD [address],  [place] [address] PCP: Tresa Garter, MD   Assessment & Plan: Visit Diagnoses:  1. Primary osteoarthritis of left knee   2. Pain of right heel     Plan: Impression is left knee arthritis and right heel plantar fasciitis.  In regard to the left knee, we injected this with cortisone today.  We will submit for approval for viscosupplementation injection.  In regards to the right heel, we have discussed appropriate shoewear and orthotics.  I have also provided her with a plantar fasciitis rehab program.  She will follow up with [place] as needed.  Follow-Up Instructions: Return if symptoms worsen or fail to improve.   Orders:  Orders Placed This Encounter  Procedures  . Large Joint Inj: L knee  . XR Knee Complete 4 Views Left  . XR Os Calcis Right   No orders of the defined types were placed in this encounter.     Procedures: Large Joint Inj: L knee on 09/24/2019 3:28 PM Indications: pain Details: 22 G needle, anterolateral approach Medications: 2 mL lidocaine 1 %; 2 mL bupivacaine 0.25 %; 40 mg methylPREDNISolone acetate 40 MG/ML      Clinical Data: No additional findings.   Subjective: Chief Complaint  Patient presents with  . Right Foot - Pain  . Left Knee - Pain    HPI patient is a pleasant 54 year old female who comes in today with recurrent left knee pain and new onset right heel pain.  In regards to her left knee, she is status post left knee arthroscopic debridement lateral meniscus tear several months ago.  It was noted that she had grade 3 changes to the lateral patellofemoral compartments.  She has had continued pain since.  We saw her little over 3 months ago for this where cortisone injection was performed.  She had moderate relief for about 3  weeks.  Her pain has returned once again.  The majority of the pain is to the medial and lateral which is worse if she is sitting, standing or walking for too long period of time.  In regards to her right heel, she has had pain here for the past month.  Her pain is worse when she goes from a seated to standing position or first thing in the morning after she wakes up and takes her first few steps.   Review of Systems as detailed in HPI.  All others reviewed and are negative.   Objective: Vital Signs: There were no vitals taken for this visit.  Physical Exam well-developed well-nourished female no acute distress.  Alert and oriented x3.  Ortho Exam examination of the left knee reveals a trace effusion.  Range of motion 0 to 110 degrees.  Medial and lateral joint line tenderness.  Moderate patellofemoral crepitus.  Ligaments are stable.  She is neurovascular tact distally.  Right foot shows marked tenderness to the plantar fashion insertion of the heel.  She has dorsiflexion to about 15 degrees.  She is neurovascular intact distally.  Specialty Comments:  No specialty comments available.  Imaging: XR Knee Complete 4 Views Left  Result Date: 09/24/2019 Moderate degenerative  changes to the lateral compartment  XR Os Calcis Right  Result Date: 09/24/2019 No acute or structural abnormalities    PMFS History: Patient Active Problem List   Diagnosis Date Noted  . Synovitis of knee   . Acute lateral meniscus tear of left knee 04/08/2019  . Screening examination for pulmonary tuberculosis 05/05/2016  . Acute bilateral low back pain without sciatica 03/27/2016  . Health care maintenance 04/22/2015  . Essential hypertension, benign 04/22/2015  . Annual physical exam 04/28/2013  . Pap smear for cervical cancer screening 04/28/2013  . 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 High blood pressure 04/28/2013   Past Medical History:  Diagnosis Date  . ADHD (attention deficit hyperactivity  disorder)   . Bipolar disorder (HCC)   . Depression   . Lateral meniscus tear    left    Family History  Problem Relation Age of Onset  . Hypertension Mother   . Heart disease Mother   . Stroke Mother   . Hypertension Daughter     Past Surgical History:  Procedure Laterality Date  . KNEE ARTHROSCOPY WITH LATERAL MENISECTOMY Left 04/11/2019   Procedure: LEFT KNEE ARTHROSCOPY WITH PARTIAL LATERAL MENISECTOMY;  Surgeon: Tarry Kos, MD;  Location: [facility];  Service: Orthopedics;  Laterality: Left;  . THROAT SURGERY     patient was 54 years old.    Social History   Occupational History  . Not on file  Tobacco Use  . Smoking status: Never Smoker  . Smokeless tobacco: Never Used  Substance and Sexual Activity  . Alcohol use: No  . Drug use: Yes    Types: Marijuana    Comment: last used marijuana 2 days ago  . Sexual activity: Not on file

## 2019-09-24 NOTE — Telephone Encounter (Signed)
Patient needs Left knee gel inj approved.  [employer] form given to patient today.

## 2019-09-24 NOTE — Telephone Encounter (Signed)
Noted.  [employer] application given to patient to complete and return to the office.

## 2019-09-26 ENCOUNTER — Telehealth: Payer: Self-pay

## 2019-09-26 NOTE — Telephone Encounter (Signed)
Patient given [employer] application in the office on Wednesday, 09/24/2019 for Left Knee, Monovisc.  Patient advised to complete application and return to the office.

## 2019-09-30 ENCOUNTER — Telehealth: Payer: Self-pay | Admitting: Orthopaedic Surgery

## 2019-09-30 ENCOUNTER — Telehealth: Payer: Self-pay

## 2019-09-30 MED ORDER — TRAMADOL HCL 50 MG PO TABS: 50.0000 mg | ORAL_TABLET | Freq: Two times a day (BID) | ORAL | 2 refills | Status: DC | PRN

## 2019-09-30 NOTE — Telephone Encounter (Signed)
Patient called requesting an RX be sent in for pain.  She received an injection, but it did not help.  Patient requests that the RX be sent to [facility] on [address].  She would also like a call back when the RX is sent to the pharmacy.  CB#[phone].  Thank you.

## 2019-09-30 NOTE — Telephone Encounter (Signed)
Patient stopped by.   She is requesting a refill on her pain medicine.   Call back: [phone]

## 2019-09-30 NOTE — Telephone Encounter (Signed)
Received completed [employer] application from patient for Monovisc, left knee. Faxed completed application to [employer] at [phone].

## 2019-10-01 ENCOUNTER — Telehealth: Payer: Self-pay | Admitting: Orthopaedic Surgery

## 2019-10-01 NOTE — Telephone Encounter (Signed)
Patient called for update about pain medication being sent to pharmacy. Please call patient when medication has been sent to pharmacy. Patient phone number is [phone]

## 2019-10-01 NOTE — Telephone Encounter (Signed)
States Rx for Tramadol makes her sleepy.

## 2019-10-01 NOTE — Telephone Encounter (Signed)
Unfortunately oxycodone is not indicated at this point.  She is well outside of the postsurgical period.  If she does need opiate pain medication she will need to ask her PCP or be referred to chronic pain clinic.  Thank you

## 2019-10-02 ENCOUNTER — Other Ambulatory Visit: Payer: Self-pay

## 2019-10-02 DIAGNOSIS — Z9889 Other specified postprocedural states: Secondary | ICD-10-CM

## 2019-10-02 DIAGNOSIS — M1712 Unilateral primary osteoarthritis, left knee: Secondary | ICD-10-CM

## 2019-10-02 NOTE — Telephone Encounter (Signed)
Order made for pain clinic. Called patient she is aware. Someone will call to schedule appointment for pain clinic.

## 2019-10-14 ENCOUNTER — Telehealth: Payer: Self-pay

## 2019-10-14 NOTE — Telephone Encounter (Signed)
Received approval letter and PRF form from [employer] for Monovisc, left knee. Completed and faxed PRF form to [phone].

## 2019-11-07 ENCOUNTER — Telehealth: Payer: Self-pay

## 2019-11-07 NOTE — Telephone Encounter (Signed)
Talked with Daneka at [employer] concerning delivery of Monovisc.  Per Daneka PRF form was received, nut no order had been placed for delivery.   Daneka placed order for delivery and Monovisc should arrive on Friday, 11/14/2019.

## 2019-11-14 ENCOUNTER — Encounter: Payer: Self-pay | Admitting: Orthopaedic Surgery

## 2019-11-14 ENCOUNTER — Ambulatory Visit (INDEPENDENT_AMBULATORY_CARE_PROVIDER_SITE_OTHER): Payer: Self-pay | Admitting: Orthopaedic Surgery

## 2019-11-14 DIAGNOSIS — M1712 Unilateral primary osteoarthritis, left knee: Secondary | ICD-10-CM

## 2019-11-14 MED ORDER — HYALURONAN 88 MG/4ML IX SOSY
88.0000 mg | PREFILLED_SYRINGE | INTRA_ARTICULAR | Status: AC | PRN
  Administered 2019-11-14: 88 mg via INTRA_ARTICULAR

## 2019-11-14 NOTE — Progress Notes (Signed)
   Procedure Note  Patient: Brandi Collins             Date of Birth: 05-25-1965           MRN: [id number]             Visit Date: 11/14/2019  Procedures: Visit Diagnoses:  1. Primary osteoarthritis of left knee     Large Joint Inj: L knee on 11/14/2019 2:05 PM Indications: pain Details: 22 G needle  Arthrogram: No  Medications: 88 mg Hyaluronan 88 MG/4ML Outcome: tolerated well, no immediate complications Patient was prepped and draped in the usual sterile fashion.

## 2019-11-17 ENCOUNTER — Telehealth: Payer: Self-pay

## 2019-11-17 NOTE — Telephone Encounter (Signed)
Received Monovisc from [employer] on Friday, 11/14/2019.

## 2020-05-13 ENCOUNTER — Ambulatory Visit: Payer: Self-pay | Admitting: Internal Medicine

## 2020-05-21 ENCOUNTER — Ambulatory Visit (HOSPITAL_COMMUNITY): Payer: Self-pay

## 2021-11-25 ENCOUNTER — Ambulatory Visit (HOSPITAL_COMMUNITY)
Admission: RE | Admit: 2021-11-25 | Discharge: 2021-11-25 | Disposition: A | Discharge status: Home / Self Care | Payer: Self-pay | Source: Ambulatory Visit | Attending: Physician Assistant | Admitting: Physician Assistant

## 2021-11-25 ENCOUNTER — Encounter (HOSPITAL_COMMUNITY): Payer: Self-pay

## 2021-11-25 VITALS — BP 127/74 | HR 100 | Temp 98.7°F | Resp 16

## 2021-11-25 DIAGNOSIS — L237 Allergic contact dermatitis due to plants, except food: Secondary | ICD-10-CM

## 2021-11-25 MED ORDER — HYDROXYZINE HCL 10 MG PO TABS: 10.0000 mg | ORAL_TABLET | Freq: Three times a day (TID) | ORAL | 0 refills | Status: DC | PRN

## 2021-11-25 MED ORDER — TRIAMCINOLONE ACETONIDE 0.5 % EX CREA: 1.0000 | TOPICAL_CREAM | Freq: Three times a day (TID) | CUTANEOUS | 0 refills | Status: DC

## 2021-11-25 MED ORDER — METHYLPREDNISOLONE SODIUM SUCC 125 MG IJ SOLR
80.0000 mg | Freq: Once | INTRAMUSCULAR | Status: AC
  Administered 2021-11-25: 80 mg via INTRAMUSCULAR

## 2021-11-25 MED ORDER — METHYLPREDNISOLONE SODIUM SUCC 125 MG IJ SOLR
INTRAMUSCULAR | Status: AC
  Filled 2021-11-25: qty 2

## 2021-11-25 NOTE — ED Provider Notes (Signed)
[facility]    CSN: [id number] Arrival date & time: 11/25/21  1800      History   Chief Complaint Chief Complaint  Patient presents with   Poison Ivy    Entered by patient    HPI Brandi Collins is a 56 y.o. female who presents to the urgent care tonight for concerns of possible poison ivy or poison oak. States that she was working in her yard last weekend and about 48 to 72 hours later started to notice an itchy rash developing on her forearms.  She now has a few fluid-filled blisters that are starting to arise on the itchy rash on her right forearm.  She also feels like she is starting to have a rash coming up on her chest.  She has not tried any medications by mouth to help with the itching.  She has tried over-the-counter hydrocortisone cream without much relief of the itching.  She denies any wheezing or chest tightness.  No fever or chills.  No chest pain.  She has never had any kind of rash like this before.  Denies any other changes including soaps or detergents.   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     OB History   No obstetric history on file.      Home Medications    Prior to Admission medications   Medication Sig Start Date End Date Taking? Authorizing Provider  hydrOXYzine (ATARAX) 10 MG tablet Take 1 tablet (10 mg total) by mouth 3 (three) times daily as needed. Caution of drowsiness. 11/25/21  Yes Jaiyana Canale M, PA-C  triamcinolone cream (KENALOG) 0.5 % Apply 1 Application topically 3 (three) times daily. Apply to affected areas except face. 11/25/21  Yes Emelynn Rance M, PA-C  diclofenac (VOLTAREN) 75 MG EC tablet Take 1 tablet (75 mg total) by mouth 2 (two) times daily. 12/04/18   Mardella Layman, MD  naproxen (NAPROSYN) 500 MG tablet Take 1 tablet (500 mg total) by mouth 2 (two) times daily with a meal. 02/05/19   Kerrin Champagne, MD  ondansetron (ZOFRAN) 4 MG tablet Take 1 tablet (4 mg total) by mouth every 8 (eight) hours as needed for nausea or vomiting. 04/11/19   Cristie Hem, PA-C  oxcarbazepine (TRILEPTAL) 600 MG tablet Take 600 mg by mouth 2 (two) times daily.    [provider]  oxyCODONE-acetaminophen (PERCOCET) 5-325 MG tablet Take 1-2 tablets by mouth 2 (two) times daily as needed for severe pain.  04/22/19   Cristie Hem, PA-C  traMADol (ULTRAM) 50 MG tablet Take 1-2 tablets (50-100 mg total) by mouth 2 (two) times daily as needed. 09/30/19   Tarry Kos, MD    Family History Family History  Problem Relation Age of Onset   Hypertension Mother    Heart disease Mother    Stroke Mother    Hypertension Daughter     Social History Social History   Tobacco Use   Smoking status: Never   Smokeless tobacco: Never  Substance Use Topics   Alcohol use: No   Drug use: Yes    Types: Marijuana    Comment: last used marijuana 2 days ago     Allergies   Patient has no known allergies.   Review of Systems Review of Systems   Physical Exam Triage  Vital Signs ED Triage Vitals [11/25/21 1841]  Enc Vitals Group     BP 127/74     Pulse Rate 100     Resp 16     Temp 98.7 F (37.1 C)     Temp Source Oral     SpO2 98 %     Weight      Height      Head Circumference      Peak Flow      Pain Score      Pain Loc      Pain Edu?      Excl. in GC?    No data found.  Updated Vital Signs BP 127/74 (BP Location: Left Arm)   Pulse 100   Temp 98.7 F (37.1 C) (Oral)   Resp 16   SpO2 98%    Physical Exam Constitutional:      Appearance: Normal appearance.  Cardiovascular:     Rate and Rhythm: Normal rate and regular rhythm.     Pulses: Normal pulses.  Pulmonary:     Effort: Pulmonary effort is normal.     Breath sounds: Normal breath sounds.  Skin:    Findings: Rash (patches of erythema bilateral forearms, with some clear-fluid vesicles on R forearm, evidence of scratching) present.  Neurological:     Mental Status: She is alert.      UC Treatments / Results  Labs (all labs ordered are listed, but only abnormal results are displayed) Labs Reviewed - No data to display  EKG   Radiology No results found.  Procedures Procedures (including critical care time)  Medications Ordered in UC Medications  methylPREDNISolone sodium succinate (SOLU-MEDROL) 125 mg/2 mL injection 80 mg (80 mg Intramuscular Given 11/25/21 1917)    Initial Impression / Assessment and Plan / UC Course  I have reviewed the triage vital signs and the nursing notes.  Pertinent labs & imaging results that were available during my care of the patient were reviewed by me and considered in my medical decision making (see chart for details).    Reassured patient that I agree this looks like a dermatitis probably secondary to poison oak or poison ivy.  She was given an injection of Depo-Medrol 80 mg IM tonight.  She was also given a prescription for hydroxyzine and triamcinolone cream to help with symptoms.  Instructed to keep cool and hydrated.   Informed this may take a week or 2 to actually resolve and she may start to have rash that is spreading despite steroid injections and best efforts.  She needs to follow-up if worse or no improvement. Final Clinical Impressions(s) / UC Diagnoses  Final diagnoses:  Allergic contact dermatitis due to plants, except food     Discharge Instructions      You were given a steroid injection for likely poison oak rash this evening.  I have also sent a prescription to take by mouth as needed to help with itching, caution with drowsiness.  You may also use triamcinolone cream on the affected areas.  Try to keep blisters intact.  Keep cool and hydrated.  Recheck with your primary care if worse or not improving.     ED Prescriptions     Medication Sig Dispense Auth. Provider   hydrOXYzine (ATARAX) 10 MG tablet Take 1 tablet (10 mg total) by mouth 3 (three) times daily as needed. Caution of drowsiness. 30 tablet Ardean Simonich M, PA-C   triamcinolone cream (KENALOG) 0.5 % Apply 1 Application topically 3 (three) times daily. Apply to affected areas except face. 30 g Fae Blossom, Crist Infante, PA-C      PDMP not reviewed this encounter.   Gotham Raden, Crist Infante, PA-C 11/25/21 2032

## 2021-11-25 NOTE — ED Triage Notes (Signed)
Pt reports rash all over her arms x 2-days.

## 2021-11-25 NOTE — Discharge Instructions (Addendum)
You were given a steroid injection for likely poison oak rash this evening.  I have also sent a prescription to take by mouth as needed to help with itching, caution with drowsiness.  You may also use triamcinolone cream on the affected areas.  Try to keep blisters intact.  Keep cool and hydrated.  Recheck with your primary care if worse or not improving.

## 2022-03-20 ENCOUNTER — Other Ambulatory Visit: Payer: Self-pay | Admitting: Family Medicine

## 2022-03-23 ENCOUNTER — Encounter (HOSPITAL_COMMUNITY): Payer: Self-pay

## 2022-03-23 ENCOUNTER — Other Ambulatory Visit: Payer: Self-pay

## 2022-03-23 ENCOUNTER — Emergency Department (HOSPITAL_COMMUNITY)
Admission: EM | Admit: 2022-03-23 | Discharge: 2022-03-23 | Discharge status: Against Medical Advice | Payer: Self-pay | Attending: Emergency Medicine | Admitting: Emergency Medicine

## 2022-03-23 DIAGNOSIS — R000 Tachycardia, unspecified: Secondary | ICD-10-CM | POA: Insufficient documentation

## 2022-03-23 DIAGNOSIS — R002 Palpitations: Secondary | ICD-10-CM | POA: Insufficient documentation

## 2022-03-23 DIAGNOSIS — Z5321 Procedure and treatment not carried out due to patient leaving prior to being seen by health care provider: Secondary | ICD-10-CM | POA: Insufficient documentation

## 2022-03-23 LAB — BASIC METABOLIC PANEL
Anion gap: 7 (ref 5–15)
BUN: 23 mg/dL — ABNORMAL HIGH (ref 6–20)
CO2: 23 mmol/L (ref 22–32)
Calcium: 8.8 mg/dL — ABNORMAL LOW (ref 8.9–10.3)
Chloride: 111 mmol/L (ref 98–111)
Creatinine, Ser: 1.27 mg/dL — ABNORMAL HIGH (ref 0.44–1.00)
GFR, Estimated: 49 mL/min — ABNORMAL LOW (ref >60)
Glucose, Bld: 81 mg/dL (ref 70–99)
Potassium: 4.2 mmol/L (ref 3.5–5.1)
Sodium: 141 mmol/L (ref 135–145)

## 2022-03-23 LAB — TSH: TSH: 0.907 u[IU]/mL (ref 0.350–4.500)

## 2022-03-23 LAB — CBC
HCT: 42.4 % (ref 36.0–46.0)
Hemoglobin: 13.4 g/dL (ref 12.0–15.0)
MCH: 28.6 pg (ref 26.0–34.0)
MCHC: 31.6 g/dL (ref 30.0–36.0)
MCV: 90.6 fL (ref 80.0–100.0)
Platelets: 214 10*3/uL (ref 150–400)
RBC: 4.68 MIL/uL (ref 3.87–5.11)
RDW: 15.6 % — ABNORMAL HIGH (ref 11.5–15.5)
WBC: 6.5 10*3/uL (ref 4.0–10.5)
nRBC: 0 % (ref 0.0–0.2)

## 2022-03-23 NOTE — ED Triage Notes (Signed)
Pt c/o feeling like her heart is racing. Pt states she drank a 5-hour energy at 1450 and drank some green tea. Pt states she checked her BP and HR at work is it was elevated.

## 2022-03-23 NOTE — ED Provider Triage Note (Signed)
Emergency Medicine Provider Triage Evaluation Note  Brandi Collins , a 56 y.o. female  was evaluated in triage.  Pt complains of heart palpitation. Sts she drank a 5 hr energy drink follows with green tea earlier in the day and shortly after she developed heart palpitation.  No hx of thyroid disease, no prior PE/DVT, no drug use, no cp, sob, fever, cough  Review of Systems  Positive: As above Negative: As above  Physical Exam  BP (!) 175/125   Pulse 98   Temp 98.3 F (36.8 C) (Oral)   Resp 20   SpO2 99%  Gen:   Awake, no distress   Resp:  Normal effort  MSK:   Moves extremities without difficulty  Other:    Medical Decision Making  Medically screening exam initiated at 6:33 PM.  Appropriate orders placed.  Brandi Collins was informed that the remainder of the evaluation will be completed by another provider, this initial triage assessment does not replace that evaluation, and the importance of remaining in the ED until their evaluation is complete.     Domenic Moras, PA-C 03/23/22 1843

## 2022-03-31 ENCOUNTER — Encounter (HOSPITAL_COMMUNITY): Payer: Self-pay

## 2022-03-31 ENCOUNTER — Ambulatory Visit (HOSPITAL_COMMUNITY)
Admission: EM | Admit: 2022-03-31 | Discharge: 2022-03-31 | Disposition: A | Discharge status: Home / Self Care | Payer: No Typology Code available for payment source | Attending: Internal Medicine | Admitting: Internal Medicine

## 2022-03-31 DIAGNOSIS — Z1152 Encounter for screening for COVID-19: Secondary | ICD-10-CM | POA: Diagnosis not present

## 2022-03-31 DIAGNOSIS — R059 Cough, unspecified: Secondary | ICD-10-CM | POA: Insufficient documentation

## 2022-03-31 DIAGNOSIS — R0981 Nasal congestion: Secondary | ICD-10-CM

## 2022-03-31 DIAGNOSIS — J069 Acute upper respiratory infection, unspecified: Secondary | ICD-10-CM | POA: Diagnosis not present

## 2022-03-31 LAB — RESP PANEL BY RT-PCR (FLU A&B, COVID) ARPGX2
Influenza A by PCR: POSITIVE — AB
Influenza B by PCR: NEGATIVE
SARS Coronavirus 2 by RT PCR: NEGATIVE

## 2022-03-31 MED ORDER — ONDANSETRON 4 MG PO TBDP
ORAL_TABLET | ORAL | Status: AC
  Filled 2022-03-31: qty 1

## 2022-03-31 MED ORDER — ONDANSETRON 4 MG PO TBDP
4.0000 mg | ORAL_TABLET | Freq: Once | ORAL | Status: AC
  Administered 2022-03-31: 4 mg via ORAL

## 2022-03-31 MED ORDER — ONDANSETRON 4 MG PO TBDP: 4.0000 mg | ORAL_TABLET | Freq: Three times a day (TID) | ORAL | 0 refills | Status: DC | PRN

## 2022-03-31 MED ORDER — BENZONATATE 100 MG PO CAPS: 100.0000 mg | ORAL_CAPSULE | Freq: Three times a day (TID) | ORAL | 0 refills | Status: DC

## 2022-03-31 NOTE — Discharge Instructions (Signed)
You have a viral upper respiratory infection.  COVID-19 and flu testing is pending. We will call you with results if positive. If your COVID test is positive, you must stay at home until day 6 of symptoms. On day 6, you may go out into public and go back to work, but you must wear a mask until day 11 of symptoms to prevent spread to others.  Take guaifenesin 1200 mg  every 12 hours to thin your mucous so that you can get it out of your body easier with coughing/blowing your nose. Drink plenty of water while taking this medication so that it works well in your body (at least 8 cups a day).   Take tessalon pearles every 8 hours as needed for cough.  You may continue using Aleve every 12 hours as needed for headache, fever/chills, and overall body aches.  You may do salt water and baking soda gargles every 4 hours as needed for your throat pain.  Please put 1 teaspoon of salt and 1/2 teaspoon of baking soda in 8 ounces of warm water then gargle and spit the water out. You may also put 1 tablespoon of honey in warm water and drink this to soothe your throat.  Place a humidifier in your room at night to help decrease dry air that can irritate your airway and cause you to have a sore throat and cough.  Please try to eat a well-balanced diet while you are sick so that your body gets proper nutrition to heal.  If you develop any new or worsening symptoms, please return.  If your symptoms are severe, please go to the emergency room.  Follow-up with your primary care provider for further evaluation and management of your symptoms as well as ongoing wellness visits.  I hope you feel better!

## 2022-03-31 NOTE — ED Provider Notes (Signed)
[facility]    CSN: [id number] Arrival date & time: 03/31/22  1114      History   Chief Complaint Chief Complaint  Patient presents with   Nasal Congestion   Cough    HPI Brandi Collins is a 56 y.o. female.   Patient presents to urgent care for evaluation of sore throat, nasal congestion, body aches, fever/chills, headache, cough, fatigue that started 2 days ago on Wednesday, March 29, 2022. Cough is mild and productive/worse at nighttime.  Head pain is generalized and is currently a 10 on a scale of 0-10.  Denies vision changes and dizziness.  Reports chills without known temperature due to lack of thermometer at home.  She reports decreased appetite and feeling mildly dehydrated due to nausea without vomiting.  No diarrhea or constipation reported. Denies shortness of breath, chest pain, abdominal pain, diarrhea, and eye drainage. 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  She has been exposed to a known COVID-positive patient recently.  Denies history of asthma or chronic respiratory problems.  Patient is not a smoker and denies drug use.  They are vaccinated against COVID-19 and they have not received their seasonal flu vaccine this year.  Has attempted use of Mucinex 600 mg and Aleve prior to arrival at urgent care for relief of symptoms with some relief.  Last dose of Aleve was this morning at approximately 2:30 AM.  Patient continues to have a headache despite taking Aleve.  She has not taken a COVID-19 home test prior to arrival urgent care.   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     OB History   No obstetric history on file.      Home Medications    Prior to Admission medications   Medication Sig Start Date End Date Taking? Authorizing Provider  benzonatate (TESSALON) 100 MG capsule Take 1 capsule (100 mg total) by mouth every 8 (eight) hours. 03/31/22  Yes Carlisle Beers, FNP  ondansetron (ZOFRAN-ODT) 4 MG disintegrating tablet Take 1 tablet (4 mg total) by mouth every 8 (eight) hours as needed for nausea or vomiting. 03/31/22  Yes StanhopeDonavan Burnet, FNP    Family History Family History  Problem Relation Age of Onset   Hypertension Mother    Heart disease Mother    Stroke Mother    Hypertension Daughter     Social History Social History   Tobacco Use  Smoking status: Never   Smokeless tobacco: Never  Substance Use Topics   Alcohol use: No   Drug use: Yes    Types: Marijuana    Comment: last used marijuana 2 days ago     Allergies   Patient has no known allergies.   Review of Systems Review of Systems  Respiratory:  Positive for cough.   Per HPI   Physical Exam Triage Vital Signs ED Triage Vitals  Enc Vitals Group     BP 03/31/22 1138 (!) 167/84     Pulse Rate 03/31/22 1138 70     Resp 03/31/22 1138 18     Temp 03/31/22 1138 98.4 F (36.9 C)     Temp Source 03/31/22 1138 Oral      SpO2 03/31/22 1138 98 %     Weight --      Height --      Head Circumference --      Peak Flow --      Pain Score 03/31/22 1139 10     Pain Loc --      Pain Edu? --      Excl. in GC? --    No data found.  Updated Vital Signs BP (!) 167/84 (BP Location: Left Arm)   Pulse 70   Temp 98.4 F (36.9 C) (Oral)   Resp 18   SpO2 98%   Visual Acuity Right Eye Distance:   Left Eye Distance:   Bilateral Distance:    Right Eye Near:   Left Eye Near:    Bilateral Near:     Physical Exam Vitals and nursing note reviewed.  Constitutional:      Appearance: She is ill-appearing. She is not toxic-appearing.  HENT:     Head: Normocephalic and atraumatic.     Right Ear: Hearing, tympanic membrane, ear canal and external ear normal.     Left Ear: Hearing, tympanic membrane, ear canal and external ear normal.     Nose: Congestion present.     Mouth/Throat:     Lips: Pink.     Mouth: Mucous membranes are moist.     Pharynx: Posterior oropharyngeal erythema present.     Comments: Small amount of clear postnasal drainage visualized to the posterior oropharynx.  Eyes:     General: Lids are normal. Vision grossly intact. Gaze aligned appropriately.        Right eye: No discharge.        Left eye: No discharge.     Extraocular Movements: Extraocular movements intact.     Conjunctiva/sclera: Conjunctivae normal.     Pupils: Pupils are equal, round, and reactive to light.  Cardiovascular:     Rate and Rhythm: Normal rate and regular rhythm.     Heart sounds: Normal heart sounds, S1 normal and S2 normal.  Pulmonary:     Effort: Pulmonary effort is normal. No respiratory distress.     Breath sounds: Normal breath sounds and air entry. No wheezing or rales.  Abdominal:     General: Bowel sounds are normal.     Palpations: Abdomen is soft.     Tenderness: There is no abdominal tenderness. There is no right CVA tenderness, left CVA tenderness or guarding.  Musculoskeletal:     Cervical back:  Normal range of motion and neck supple.  Lymphadenopathy:     Cervical: No cervical adenopathy.  Skin:    General: Skin is warm and dry.     Capillary Refill: Capillary refill takes less than  2 seconds.     Findings: No rash.  Neurological:     General: No focal deficit present.     Mental Status: She is alert and oriented to person, place, and time. Mental status is at baseline.     Cranial Nerves: No dysarthria or facial asymmetry.     Motor: No weakness.     Gait: Gait normal.  Psychiatric:        Mood and Affect: Mood normal.        Speech: Speech normal.        Behavior: Behavior normal.        Thought Content: Thought content normal.        Judgment: Judgment normal.      UC Treatments / Results  Labs (all labs ordered are listed, but only abnormal results are displayed) Labs Reviewed  RESP PANEL BY RT-PCR (FLU A&B, COVID) ARPGX2    EKG   Radiology No results found.  Procedures Procedures (including critical care time)  Medications Ordered in UC Medications  ondansetron (ZOFRAN-ODT) disintegrating tablet 4 mg (4 mg Oral Given 03/31/22 1254)    Initial Impression / Assessment and Plan / UC Course  I have reviewed the triage vital signs and the nursing notes.  Pertinent labs & imaging results that were available during my care of the patient were reviewed by me and considered in my medical decision making (see chart for details).   Viral URI with cough Symptoms and physical exam consistent with a viral upper respiratory tract infection that will likely resolve with rest, fluids, and prescriptions for symptomatic relief. No indication for imaging today based on stable cardiopulmonary exam and hemodynamically stable vital signs.  COVID-19 and flu testing is pending.  We will call patient if this is positive.  Quarantine guidelines discussed. Currently on day 3 of symptoms and does qualify for antiviral therapy.    Patient given Zofran in clinic today for  decreased appetite and nausea.  Zofran 4 mg every 8 hours as needed for nausea and Tessalon Perles sent to pharmacy for symptomatic relief to be taken as prescribed.   Patient to continue using Mucinex but increase the dose to 1200 mg every 12 hours as needed for nasal congestion and cough at home.  She is to continue using Aleve every 12 hours as needed for headache and fever/chills.  She may add on Tylenol every 6 hours as needed for worsening or breakthrough discomfort.  Nonpharmacologic interventions for symptom relief provided and after visit summary below.   Strict ED/urgent care return precautions given.  Patient verbalizes understanding and agreement with plan.  Counseled patient regarding possible side effects and uses of all medications prescribed at today's visit.  Patient verbalizes understanding and agreement with plan.  All questions answered.  Patient discharged from urgent care in stable condition.        Final Clinical Impressions(s) / UC Diagnoses   Final diagnoses:  Viral URI with cough  Nasal congestion     Discharge Instructions      You have a viral upper respiratory infection.  COVID-19 and flu testing is pending. We will call you with results if positive. If your COVID test is positive, you must stay at home until day 6 of symptoms. On day 6, you may go out into public and go back to work, but you must wear a mask until day 11 of symptoms to prevent spread to others.  Take guaifenesin 1200 mg  every 12 hours to thin  your mucous so that you can get it out of your body easier with coughing/blowing your nose. Drink plenty of water while taking this medication so that it works well in your body (at least 8 cups a day).   Take tessalon pearles every 8 hours as needed for cough.  You may continue using Aleve every 12 hours as needed for headache, fever/chills, and overall body aches.  You may do salt water and baking soda gargles every 4 hours as needed for your  throat pain.  Please put 1 teaspoon of salt and 1/2 teaspoon of baking soda in 8 ounces of warm water then gargle and spit the water out. You may also put 1 tablespoon of honey in warm water and drink this to soothe your throat.  Place a humidifier in your room at night to help decrease dry air that can irritate your airway and cause you to have a sore throat and cough.  Please try to eat a well-balanced diet while you are sick so that your body gets proper nutrition to heal.  If you develop any new or worsening symptoms, please return.  If your symptoms are severe, please go to the emergency room.  Follow-up with your primary care provider for further evaluation and management of your symptoms as well as ongoing wellness visits.  I hope you feel better!     ED Prescriptions     Medication Sig Dispense Auth. Provider   benzonatate (TESSALON) 100 MG capsule Take 1 capsule (100 mg total) by mouth every 8 (eight) hours. 21 capsule Reita May M, FNP   ondansetron (ZOFRAN-ODT) 4 MG disintegrating tablet Take 1 tablet (4 mg total) by mouth every 8 (eight) hours as needed for nausea or vomiting. 20 tablet Carlisle Beers, FNP      PDMP not reviewed this encounter.   Carlisle Beers, [place] 03/31/22 1259

## 2022-03-31 NOTE — ED Triage Notes (Signed)
Patient with c/o head cold. States head and ears hurt. States she just "doesn't feel good".

## 2022-04-19 ENCOUNTER — Encounter: Payer: Self-pay | Admitting: Family Medicine

## 2022-04-19 DIAGNOSIS — Z1231 Encounter for screening mammogram for malignant neoplasm of breast: Secondary | ICD-10-CM

## 2022-05-02 ENCOUNTER — Other Ambulatory Visit: Payer: Self-pay | Admitting: Family Medicine

## 2022-05-02 DIAGNOSIS — Z1231 Encounter for screening mammogram for malignant neoplasm of breast: Secondary | ICD-10-CM

## 2022-05-04 ENCOUNTER — Ambulatory Visit
Admission: RE | Admit: 2022-05-04 | Discharge: 2022-05-04 | Disposition: A | Discharge status: Home / Self Care | Payer: No Typology Code available for payment source | Source: Ambulatory Visit | Attending: Family Medicine | Admitting: Family Medicine

## 2022-05-04 DIAGNOSIS — Z1231 Encounter for screening mammogram for malignant neoplasm of breast: Secondary | ICD-10-CM

## 2022-08-22 DIAGNOSIS — F3160 Bipolar disorder, current episode mixed, unspecified: Secondary | ICD-10-CM | POA: Insufficient documentation

## 2022-08-25 ENCOUNTER — Other Ambulatory Visit: Payer: Self-pay | Admitting: Family Medicine

## 2022-08-25 DIAGNOSIS — Z13820 Encounter for screening for osteoporosis: Secondary | ICD-10-CM

## 2022-09-27 ENCOUNTER — Encounter (HOSPITAL_COMMUNITY): Payer: Self-pay

## 2022-09-27 ENCOUNTER — Ambulatory Visit (HOSPITAL_COMMUNITY)
Admission: EM | Admit: 2022-09-27 | Discharge: 2022-09-27 | Disposition: A | Discharge status: Home / Self Care | Payer: Medicaid Other | Attending: Internal Medicine | Admitting: Internal Medicine

## 2022-09-27 ENCOUNTER — Ambulatory Visit (INDEPENDENT_AMBULATORY_CARE_PROVIDER_SITE_OTHER): Payer: Medicaid Other

## 2022-09-27 DIAGNOSIS — S92515A Nondisplaced fracture of proximal phalanx of left lesser toe(s), initial encounter for closed fracture: Secondary | ICD-10-CM

## 2022-09-27 DIAGNOSIS — J029 Acute pharyngitis, unspecified: Secondary | ICD-10-CM | POA: Insufficient documentation

## 2022-09-27 LAB — POCT RAPID STREP A (OFFICE): Rapid Strep A Screen: NEGATIVE

## 2022-09-27 MED ORDER — KETOROLAC TROMETHAMINE 30 MG/ML IJ SOLN
30.0000 mg | Freq: Once | INTRAMUSCULAR | Status: AC
  Administered 2022-09-27: 30 mg via INTRAMUSCULAR

## 2022-09-27 MED ORDER — KETOROLAC TROMETHAMINE 30 MG/ML IJ SOLN
INTRAMUSCULAR | Status: AC
  Filled 2022-09-27: qty 1

## 2022-09-27 NOTE — ED Triage Notes (Signed)
Patient c/o left pinky toe injury that happened yesterday. Patient states she hit the corner of her chair with her toe.  Patient also reports that she has had a sore throat x 2 days. Patient states her daughter is positive for strep throat.

## 2022-09-27 NOTE — Discharge Instructions (Addendum)
Your x-rays did show a fracture of your pinky toe.  We have buddy taped it today in clinic and given you a postop shoe for comfort.  Please rest, ice, and elevate your toe.  You can follow-up with an orthopedic if your pain persist beyond the next few weeks to ensure proper healing.  Your rapid strep was negative today in clinic, we have sent this off for culture and we will contact you if positive.  In the meantime, please alternate between Tylenol and ibuprofen for any aches or discomforts.  Please do warm saline gargles, tea with honey and sleep with a humidifier for symptomatic relief.

## 2022-09-27 NOTE — ED Provider Notes (Signed)
[facility]    CSN: [id number] Arrival date & time: 09/27/22  1836      History   Chief Complaint Chief Complaint  Patient presents with   Toe Injury   Sore Throat    HPI Brandi Collins is a 57 y.o. female.   Patient presents to clinic over concerns of left pinky toe injury, she jammed it against a chair yesterday.  Reports it is painful and swollen.  Has not taken anything for pain.  Able to move her toe.  Sensation intact.  She also has had a sore throat for the past 2 days.  Her daughter is sick with strep throat.  Patient denies trouble swallowing, shortness of breath or fevers.   The history is provided by the patient and medical records.  Sore Throat Pertinent negatives include no chest pain, no abdominal pain and no shortness of breath.    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     OB History   No obstetric history on file.      Home Medications    Prior to Admission medications   Medication Sig  Start Date End Date Taking? Authorizing Provider  benzonatate (TESSALON) 100 MG capsule Take 1 capsule (100 mg total) by mouth every 8 (eight) hours. 03/31/22   Carlisle Beers, FNP  ondansetron (ZOFRAN-ODT) 4 MG disintegrating tablet Take 1 tablet (4 mg total) by mouth every 8 (eight) hours as needed for nausea or vomiting. 03/31/22   Carlisle Beers, FNP    Family History Family History  Problem Relation Age of Onset   Hypertension Mother    Heart disease Mother    Stroke Mother    Hypertension Daughter     Social History Social History   Tobacco Use   Smoking status: Never   Smokeless tobacco: Never  Vaping Use   Vaping Use: Never used  Substance Use Topics   Alcohol use: No   Drug use: Not Currently    Types: Marijuana    Comment: last used marijuana 2 days ago     Allergies   Patient has no known allergies.   Review of Systems Review of Systems  Constitutional:  Negative for chills and fever.  HENT:  Positive for sore throat.   Respiratory:  Negative for cough and shortness of breath.   Cardiovascular:  Negative for chest pain.  Gastrointestinal:  Negative for abdominal pain.  Musculoskeletal:  Positive for gait problem and joint swelling.  Physical Exam Triage Vital Signs ED Triage Vitals  Enc Vitals Group     BP 09/27/22 1935 127/89     Pulse Rate 09/27/22 1935 76     Resp 09/27/22 1935 16     Temp 09/27/22 1935 97.9 F (36.6 C)     Temp Source 09/27/22 1935 Oral     SpO2 09/27/22 1935 93 %     Weight --      Height --      Head Circumference --      Peak Flow --      Pain Score 09/27/22 1937 10     Pain Loc --      Pain Edu? --      Excl. in GC? --    No data found.  Updated Vital Signs BP 127/89 (BP Location: Left Arm)   Pulse 76   Temp 97.9 F (36.6 C) (Oral)   Resp 16   SpO2 93%   Visual Acuity Right Eye Distance:   Left Eye Distance:   Bilateral Distance:    Right Eye Near:   Left Eye Near:    Bilateral  Near:     Physical Exam Vitals and nursing note reviewed.  Constitutional:      Appearance: Normal appearance. She is well-developed.  HENT:     Head: Normocephalic and atraumatic.     Nose: No congestion or rhinorrhea.     Mouth/Throat:     Mouth: Mucous membranes are moist.     Pharynx: Uvula midline. Posterior oropharyngeal erythema present.     Tonsils: No tonsillar exudate or tonsillar abscesses. 2+ on the right. 2+ on the left.  Eyes:     Conjunctiva/sclera: Conjunctivae normal.  Cardiovascular:     Rate and Rhythm: Normal rate and regular rhythm.     Pulses:          Dorsalis pedis pulses are 2+ on the left side.       Posterior tibial pulses are 2+ on the left side.  Pulmonary:     Effort: Pulmonary effort is normal. No respiratory distress.  Musculoskeletal:     Cervical back: Normal range of motion.       Feet:  Feet:     Left foot:     Skin integrity: Skin integrity normal.     Toenail Condition: Left toenails are normal.     Comments: Tenderness and swelling to left fifth phalanx.  Skin:    General: Skin is warm and dry.     Capillary Refill: Capillary refill takes less than 2 seconds.  Neurological:     General: No focal deficit present.     Mental Status: She is alert and oriented to person, place, and time.  Psychiatric:        Mood and Affect: Mood normal.        Behavior: Behavior normal.      UC Treatments / Results  Labs (all labs ordered are listed, but only abnormal results are displayed) Labs Reviewed  CULTURE, GROUP A STREP [facility])  POCT RAPID STREP A (OFFICE)    EKG   Radiology DG Toe 5th Left  Result Date: 09/27/2022 CLINICAL DATA:  Left fifth toe injury. EXAM: DG TOE 5TH LEFT COMPARISON:  None Available. FINDINGS: There is a lucency in the base of the proximal phalanx of the fifth digit with intra-articular extension suggesting nondisplaced fracture. There is no evidence of arthropathy or other focal bone abnormality. Soft tissue  swelling is present at  the fifth digit. IMPRESSION: Nondisplaced fracture of the proximal phalanx of the fifth digit. Electronically Signed   By: Thornell Sartorius M.D.   On: 09/27/2022 20:19    Procedures Procedures (including critical care time)  Medications Ordered in UC Medications  ketorolac (TORADOL) 30 MG/ML injection 30 mg (has no administration in time range)    Initial Impression / Assessment and Plan / UC Course  I have reviewed the triage vital signs and the nursing notes.  Pertinent labs & imaging results that were available during my care of the patient were reviewed by me and considered in my medical decision making (see chart for details).  Vitals and triage reviewed, patient is hemodynamically stable.  Recent exposure to strep with a sore throat for the past 2 days.  Rapid strep in clinic negative, will send for culture.  Discussed symptomatic management for pharyngitis.  Reports left pinky toe pain after hitting it on a chair.  Area is painful, swollen and tender.  Left fifth toe imaging positive for nondisplaced fracture of the proximal phalanx of the fifth digit, buddy taped in clinic and given post-op shoe for comfort.  Given information to follow-up with orthopedic.  Symptomatic management discussed.  IM Toradol for pain management in clinic.  Follow-up and return precautions discussed, no questions at this time.     Final Clinical Impressions(s) / UC Diagnoses   Final diagnoses:  Pharyngitis, unspecified etiology  Closed nondisplaced fracture of proximal phalanx of lesser toe of left foot, initial encounter     Discharge Instructions      Your x-rays did show a fracture of your pinky toe.  We have buddy taped it today in clinic and given you a postop shoe for comfort.  Please rest, ice, and elevate your toe.  You can follow-up with an orthopedic if your pain persist beyond the next few weeks to ensure proper healing.  Your rapid strep was negative today in clinic,  we have sent this off for culture and we will contact you if positive.  In the meantime, please alternate between Tylenol and ibuprofen for any aches or discomforts.  Please do warm saline gargles, tea with honey and sleep with a humidifier for symptomatic relief.       ED Prescriptions   None    PDMP not reviewed this encounter.   Tymika Grilli, [place] N, [place] 09/27/22 2035

## 2022-09-28 ENCOUNTER — Telehealth (HOSPITAL_COMMUNITY): Payer: Self-pay

## 2022-09-28 LAB — CULTURE, GROUP A STREP (THRC)

## 2022-09-28 MED ORDER — AMOXICILLIN 500 MG PO CAPS: 500.0000 mg | ORAL_CAPSULE | Freq: Two times a day (BID) | ORAL | 0 refills | Status: AC

## 2022-11-03 ENCOUNTER — Encounter (HOSPITAL_COMMUNITY): Payer: Self-pay

## 2022-11-03 ENCOUNTER — Ambulatory Visit (HOSPITAL_COMMUNITY)
Admission: EM | Admit: 2022-11-03 | Discharge: 2022-11-03 | Disposition: A | Discharge status: Home / Self Care | Payer: Medicaid Other

## 2022-11-03 DIAGNOSIS — H1033 Unspecified acute conjunctivitis, bilateral: Secondary | ICD-10-CM

## 2022-11-03 MED ORDER — OFLOXACIN 0.3 % OP SOLN: 1.0000 [drp] | Freq: Four times a day (QID) | OPHTHALMIC | 0 refills | Status: AC

## 2022-11-03 NOTE — ED Triage Notes (Signed)
Pt states woke up this morning with a style to rt lower eye lid and lt eye watering.   Pt c/o a knot to lt wrist for years. States wants is removed or referred where to go.

## 2022-11-03 NOTE — ED Provider Notes (Signed)
[facility]    CSN: [id number] Arrival date & time: 11/03/22  0841      History   Chief Complaint Chief Complaint  Patient presents with   Eye Pain    HPI Brandi Collins is a 57 y.o. female.   Patient presents to clinic for complaints of right eye redness and discharge.  Reports her left eye started to be irritated this morning as well.  She thinks she has a stye to her right lower eyelid and is unsure how to treat it.  She does wear lashes every day, removes them every night.  Also wears contacts daily, removes them at night.  Denies any vision changes or pain.  She has a cystic structure to her left distal forearm that has been present for years.  Feels like the area is growing and would like to inquire about a referral for removal.  Reports she has had this biopsied in the past, and it was benign.  She does have a PCP.   The history is provided by the patient and medical records.  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     OB History   No obstetric history on file.      Home Medications    Prior to Admission medications   Medication Sig Start Date End Date Taking? Authorizing Provider  FLUoxetine (PROZAC) 10 MG capsule Take by mouth. 10/04/22 01/02/23 Yes [provider]  hydrochlorothiazide (HYDRODIURIL) 25 MG tablet Take 1 tablet by mouth daily. 09/07/22 09/07/23 Yes [provider]  ofloxacin (OCUFLOX) 0.3 % ophthalmic solution Place 1 drop into both eyes 4 (four) times daily for 5 days. 11/03/22 11/08/22 Yes Maeby Vankleeck, [place] N, FNP    Family History Family History  Problem Relation Age of Onset   Hypertension Mother    Heart disease Mother    Stroke Mother    Hypertension Daughter     Social History Social History   Tobacco Use   Smoking status: Never   Smokeless tobacco: Never  Vaping Use   Vaping Use: Never used  Substance Use Topics   Alcohol use: No   Drug use: Not Currently    Types: Marijuana    Comment: last used marijuana 2 days ago     Allergies   Patient has no known allergies.   Review of Systems Review of Systems  Eyes:  Positive for discharge, redness and itching. Negative for photophobia, pain and visual disturbance.  Physical Exam Triage Vital Signs ED Triage Vitals  Enc Vitals Group     BP 11/03/22 0939 113/87     Pulse Rate 11/03/22 0939 76     Resp 11/03/22 0939 18     Temp 11/03/22 0939 98.5 F (36.9 C)     Temp Source 11/03/22 0939 Oral     SpO2 11/03/22 0939 95 %     Weight --      Height --      Head Circumference --      Peak Flow --      Pain Score 11/03/22 0940 0     Pain Loc --      Pain Edu? --      Excl. in GC? --    No data found.  Updated Vital Signs BP 113/87 (BP Location: Left Arm)   Pulse 76   Temp 98.5 F (36.9 C) (Oral)   Resp 18   SpO2 95%   Visual Acuity Right Eye Distance:   Left Eye Distance:   Bilateral Distance:    Right Eye Near:   Left Eye Near:     Bilateral Near:     Physical Exam Vitals and nursing note reviewed.  Constitutional:      Appearance: Normal appearance.  HENT:     Head: Normocephalic and atraumatic.     Right Ear: External ear normal.     Left Ear: External ear normal.     Nose: Nose normal.     Mouth/Throat:     Mouth: Mucous membranes are moist.  Eyes:     General: Lids are normal. Lids are everted, no foreign bodies appreciated. Vision grossly intact. Gaze aligned appropriately.        Right eye: Discharge present.     Extraocular Movements: Extraocular movements intact.     Conjunctiva/sclera:     Right eye: Right conjunctiva is injected.     Left eye: Left conjunctiva is injected.     Pupils: Pupils are equal, round, and reactive to light.  Cardiovascular:     Rate and Rhythm: Normal rate.  Pulmonary:     Effort: Pulmonary effort is normal. No respiratory distress.  Musculoskeletal:        General: No swelling. Normal range of motion.  Skin:    General: Skin is warm and dry.     Findings: Lesion present.          Comments: Cystic structure to left distal forearm, approximately 2 cm x 2 cm.  Nontender and mobile.  Neurological:     General: No focal deficit present.     Mental Status: She is alert.  Psychiatric:        Mood and Affect: Mood normal.        Behavior: Behavior is cooperative.      UC Treatments / Results  Labs (all labs ordered are listed, but only abnormal results are displayed) Labs Reviewed - No data to display  EKG   Radiology No results found.  Procedures Procedures (including critical care time)  Medications Ordered in UC Medications - No data to display  Initial Impression / Assessment and Plan / UC Course  I have reviewed the triage vital signs and the nursing notes.  Pertinent labs & imaging results that were available during my care of the patient were reviewed by me and considered in my medical decision making (see chart for details).  Vitals and  triage reviewed, patient is hemodynamically stable.  Conjunctival irritation with  scant to discharge noted bilaterally, will cover with ofloxacin due to the fact that she wears contacts.  Cystic structure to left distal forearm, nontender and mobile.  Advised to follow-up with PCP for dermatology referral and potential removal.  Symptomatic management for conjunctivitis discussed, plan of care, follow-up care and return precautions given, no questions at this time.     Final Clinical Impressions(s) / UC Diagnoses   Final diagnoses:  Acute conjunctivitis of both eyes, unspecified acute conjunctivitis type     Discharge Instructions      Please use the antibiotic drops as prescribed and until finished.  For symptomatic management of your stye, you can do a warm compress with a clean rag for 10 minutes 3 times daily.  For any pain or discomfort you can alternate between Tylenol and ibuprofen every 4-6 hours.  Please do not wear eye make-up or fake lashes until all antibiotics are completed.  Please refrain from contact use until antibiotics are completed as well.  You do have a nontender, mobile cystic structure to your left distal forearm.  Please follow-up with your primary care, who can then refer you as appropriate for potential removal.  Please return to clinic for any new or concerning symptoms.      ED Prescriptions     Medication Sig Dispense Auth. Provider   ofloxacin (OCUFLOX) 0.3 % ophthalmic solution Place 1 drop into both eyes 4 (four) times daily for 5 days. 5 mL Vanderbilt Ranieri, [place] N, FNP      PDMP not reviewed this encounter.   Madeleyn Schwimmer, [place] N, [place] 11/03/22 1006

## 2022-11-03 NOTE — Discharge Instructions (Addendum)
Please use the antibiotic drops as prescribed and until finished.  For symptomatic management of your stye, you can do a warm compress with a clean rag for 10 minutes 3 times daily.  For any pain or discomfort you can alternate between Tylenol and ibuprofen every 4-6 hours.  Please do not wear eye make-up or fake lashes until all antibiotics are completed.  Please refrain from contact use until antibiotics are completed as well.  You do have a nontender, mobile cystic structure to your left distal forearm.  Please follow-up with your primary care, who can then refer you as appropriate for potential removal.  Please return to clinic for any new or concerning symptoms.

## 2022-12-07 ENCOUNTER — Other Ambulatory Visit: Payer: Self-pay | Admitting: Family Medicine

## 2022-12-07 DIAGNOSIS — Z1231 Encounter for screening mammogram for malignant neoplasm of breast: Secondary | ICD-10-CM

## 2022-12-28 ENCOUNTER — Ambulatory Visit (HOSPITAL_COMMUNITY)
Admission: EM | Admit: 2022-12-28 | Discharge: 2022-12-28 | Disposition: A | Discharge status: Home / Self Care | Payer: Medicaid Other

## 2022-12-28 ENCOUNTER — Encounter (HOSPITAL_COMMUNITY): Payer: Self-pay

## 2022-12-28 DIAGNOSIS — M545 Low back pain, unspecified: Secondary | ICD-10-CM | POA: Diagnosis not present

## 2022-12-28 HISTORY — DX: Essential (primary) hypertension: I10

## 2022-12-28 LAB — POCT URINALYSIS DIP (MANUAL ENTRY)
Bilirubin, UA: NEGATIVE
Blood, UA: NEGATIVE
Glucose, UA: NEGATIVE mg/dL
Ketones, POC UA: NEGATIVE mg/dL
Leukocytes, UA: NEGATIVE
Nitrite, UA: NEGATIVE
Protein Ur, POC: NEGATIVE mg/dL
Spec Grav, UA: 1.02 (ref 1.010–1.025)
Urobilinogen, UA: 0.2 E.U./dL
pH, UA: 6.5 (ref 5.0–8.0)

## 2022-12-28 MED ORDER — KETOROLAC TROMETHAMINE 30 MG/ML IJ SOLN
15.0000 mg | Freq: Once | INTRAMUSCULAR | Status: AC
  Administered 2022-12-28: 15 mg via INTRAMUSCULAR

## 2022-12-28 MED ORDER — CYCLOBENZAPRINE HCL 5 MG PO TABS: 5.0000 mg | ORAL_TABLET | Freq: Three times a day (TID) | ORAL | 0 refills | Status: DC | PRN

## 2022-12-28 MED ORDER — KETOROLAC TROMETHAMINE 30 MG/ML IJ SOLN
INTRAMUSCULAR | Status: AC
  Filled 2022-12-28: qty 1

## 2022-12-28 NOTE — ED Triage Notes (Addendum)
Low Back Pain onset at work last night and getting worse. No history of back problems. Patient has been a cma for 20 years. Patient had nut pulled or lifted anything heavy last night.  No relief with taking Aleve earlier today.

## 2022-12-28 NOTE — ED Provider Notes (Signed)
[facility]    CSN: [id number] Arrival date & time: 12/28/22  1546      History   Chief Complaint Chief Complaint  Patient presents with   Back Pain    HPI Brandi Collins is a 57 y.o. female.   Patient presents for back pain that started suddenly yesterday while at work.  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  She does not remember what she was doing at work when the pain started.  Denies recent trauma accident or injury to the back.  Reports the pain is severe and sharp.  The pain does not radiate down either leg.  Denies new numbness or tingling going down the legs, saddle anesthesia, bowel or bladder incontinence that is new, fever, nausea/vomiting, dysuria, urinary frequency, hematuria since the pain began.  No lower extremity weakness or decreased sensation of the lower extremities.  Has taken Aleve for the pain without improvement.  No history of similar pain.      Past Medical History:  Diagnosis Date   ADHD (attention deficit hyperactivity disorder)    Bipolar disorder (HCC)    Depression    Hypertension    Lateral meniscus tear    left    Patient Active Problem List   Diagnosis Date Noted   Synovitis of knee    Acute lateral meniscus tear of left knee 04/08/2019   Screening examination for pulmonary tuberculosis 05/05/2016   Acute bilateral low back pain without sciatica 03/27/2016   Health care maintenance 04/22/2015   Essential hypertension, benign 04/22/2015   Annual physical exam 04/28/2013   Pap smear for cervical cancer screening 04/28/2013   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     OB History   No obstetric history on file.      Home Medications    Prior to Admission medications   Medication Sig Start Date End Date Taking? Authorizing Provider  cyclobenzaprine (FLEXERIL) 5 MG tablet Take 1 tablet (5 mg total) by mouth 3 (three) times daily as needed for muscle spasms. Do not take with alcohol or while driving or operating heavy machinery.  May cause drowsiness. 12/28/22  Yes Cathlean Marseilles A, NP  hydrochlorothiazide (HYDRODIURIL) 25 MG tablet Take 1 tablet by mouth daily. 09/07/22 09/07/23 Yes [provider]  NIFEdipine (ADALAT CC) 30 MG 24 hr tablet Take 30 mg by mouth daily.   Yes [provider]  penicillin v potassium (VEETID) 500 MG tablet Take 500 mg by mouth 4 (four) times daily. 07/30/22  Yes [provider]  topiramate (TOPAMAX) 50 MG tablet Take 50 mg by mouth at bedtime. 12/20/22  Yes [provider]  FLUoxetine (PROZAC) 10 MG capsule Take by mouth. 10/04/22 01/02/23  [provider]    Family History Family History  Problem Relation Age of Onset   Hypertension Mother    Heart disease Mother    Stroke Mother    Hypertension Daughter     Social History Social History   Tobacco Use   Smoking status:  Never   Smokeless tobacco: Never  Vaping Use   Vaping status: Never Used  Substance Use Topics   Alcohol use: No   Drug use: Not Currently    Types: Marijuana    Comment: last used marijuana 2 days ago     Allergies   Patient has no known allergies.   Review of Systems Review of Systems Per HPI  Physical Exam Triage Vital Signs ED Triage Vitals  Encounter Vitals Group     BP 12/28/22 1640 (!) 136/91     Systolic BP Percentile --      Diastolic BP Percentile --      Pulse Rate 12/28/22 1640 62     Resp 12/28/22 1640 18     Temp 12/28/22 1640 97.9 F (36.6 C)     Temp Source 12/28/22  1640 Oral     SpO2 12/28/22 1640 95 %     Weight 12/28/22 1639 213 lb (96.6 kg)     Height 12/28/22 1639 5\' 9"  (1.753 m)     Head Circumference --      Peak Flow --      Pain Score 12/28/22 1637 10     Pain Loc --      Pain Education --      Exclude from Growth Chart --    No data found.  Updated Vital Signs BP (!) 136/91 (BP Location: Left Arm) Comment: Did not take BP meds today  Pulse 62   Temp 97.9 F (36.6 C) (Oral)   Resp 18   Ht 5\' 9"  (1.753 m)   Wt 213 lb (96.6 kg)   SpO2 95%   BMI 31.45 kg/m   Visual Acuity Right Eye Distance:   Left Eye Distance:   Bilateral Distance:    Right Eye Near:   Left Eye Near:    Bilateral Near:     Physical Exam Vitals and nursing note reviewed.  Constitutional:      General: She is not in acute distress.    Appearance: Normal appearance. She is not toxic-appearing.  HENT:     Mouth/Throat:     Mouth: Mucous membranes are moist.     Pharynx: Oropharynx is clear.  Pulmonary:     Effort: Pulmonary effort is normal. No respiratory distress.  Musculoskeletal:       Back:     Comments: Inspection: no swelling, bruising, obvious deformity or redness to lumbar spine Palpation: tender to palpation along entire low back in area marked; no obvious deformities palpated ROM: Full ROM to lumbar spine; straight leg raise negative bilaterally Strength: 5/5 bilateral lower extremities Neurovascular: neurovascularly intact in bilateral lower extremities  Skin:    General: Skin is warm and dry.     Capillary Refill: Capillary refill takes less than 2 seconds.     Coloration: Skin is not jaundiced or pale.     Findings: No erythema.  Neurological:     Mental Status: She is alert and oriented to person, place, and time.  Psychiatric:        Behavior: Behavior is cooperative.      UC Treatments / Results  Labs (all labs ordered are listed, but only abnormal results are displayed) Labs Reviewed  POCT URINALYSIS DIP (MANUAL ENTRY) -  Normal    EKG   Radiology No results found.  Procedures Procedures (including critical care time)  Medications Ordered in UC Medications  ketorolac (TORADOL) 30 MG/ML injection 15 mg (15 mg Intramuscular Given 12/28/22 1720)  Initial Impression / Assessment and Plan / UC Course  I have reviewed the triage vital signs and the nursing notes.  Pertinent labs & imaging results that were available during my care of the patient were reviewed by me and considered in my medical decision making (see chart for details).   Patient is well-appearing, afebrile, not tachycardic, not tachypneic, oxygenating well on room air.  Patient is mildly hypertensive and has not taken her antihypertensive yet today.  1. Acute midline low back pain without sciatica No red flags in history or on exam-vitals are stable Toradol 15 mg IM given today in urgent care given history of CKD Start cyclobenzaprine 5 mg every hours as needed for muscular pain Recommended rest, ice/heat, light range of motion/stretching exercises Work excuse given Strict ER precautions discussed  The patient was given the opportunity to ask questions.  All questions answered to their satisfaction.  The patient is in agreement to this plan.    Final Clinical Impressions(s) / UC Diagnoses   Final diagnoses:  Acute midline low back pain without sciatica     Discharge Instructions      We gave you a shot of Toradol today to help with pain in your back.  You can take the Flexeril (muscle relaxant) as needed to help with back pain.  Also start heat and ice and light range of motion exercises.  Seek care if ER if your symptoms worsen.    ED Prescriptions     Medication Sig Dispense Auth. Provider   cyclobenzaprine (FLEXERIL) 5 MG tablet Take 1 tablet (5 mg total) by mouth 3 (three) times daily as needed for muscle spasms. Do not take with alcohol or while driving or operating heavy machinery.  May cause drowsiness. 30 tablet  Valentino Nose, NP      PDMP not reviewed this encounter.   Valentino Nose, NP 12/28/22 [phone]

## 2022-12-28 NOTE — Discharge Instructions (Signed)
We gave you a shot of Toradol today to help with pain in your back.  You can take the Flexeril (muscle relaxant) as needed to help with back pain.  Also start heat and ice and light range of motion exercises.  Seek care if ER if your symptoms worsen.

## 2022-12-31 ENCOUNTER — Emergency Department (HOSPITAL_COMMUNITY): Payer: Medicaid Other

## 2022-12-31 ENCOUNTER — Emergency Department (HOSPITAL_COMMUNITY)
Admission: EM | Admit: 2022-12-31 | Discharge: 2022-12-31 | Disposition: A | Discharge status: Home / Self Care | Payer: Medicaid Other | Attending: Emergency Medicine | Admitting: Emergency Medicine

## 2022-12-31 ENCOUNTER — Other Ambulatory Visit: Payer: Self-pay

## 2022-12-31 ENCOUNTER — Encounter (HOSPITAL_COMMUNITY): Payer: Self-pay

## 2022-12-31 DIAGNOSIS — I10 Essential (primary) hypertension: Secondary | ICD-10-CM | POA: Insufficient documentation

## 2022-12-31 DIAGNOSIS — M545 Low back pain, unspecified: Secondary | ICD-10-CM | POA: Diagnosis present

## 2022-12-31 DIAGNOSIS — Z79899 Other long term (current) drug therapy: Secondary | ICD-10-CM | POA: Insufficient documentation

## 2022-12-31 DIAGNOSIS — R0602 Shortness of breath: Secondary | ICD-10-CM | POA: Insufficient documentation

## 2022-12-31 MED ORDER — KETOROLAC TROMETHAMINE 15 MG/ML IJ SOLN
15.0000 mg | Freq: Once | INTRAMUSCULAR | Status: AC
  Administered 2022-12-31: 15 mg via INTRAVENOUS
  Filled 2022-12-31: qty 1

## 2022-12-31 MED ORDER — LIDOCAINE 5 % EX PTCH: 1.0000 | MEDICATED_PATCH | CUTANEOUS | 0 refills | Status: DC

## 2022-12-31 MED ORDER — ACETAMINOPHEN 325 MG PO TABS
650.0000 mg | ORAL_TABLET | Freq: Once | ORAL | Status: AC
  Administered 2022-12-31: 650 mg via ORAL
  Filled 2022-12-31: qty 2

## 2022-12-31 MED ORDER — LIDOCAINE 5 % EX PTCH
1.0000 | MEDICATED_PATCH | CUTANEOUS | Status: DC
  Administered 2022-12-31: 1 via TRANSDERMAL
  Filled 2022-12-31: qty 1

## 2022-12-31 NOTE — ED Provider Notes (Signed)
[place] [facility] Provider Note   CSN: [id number] Arrival date & time: 12/31/22  1400     History  Chief Complaint  Patient presents with   Back Pain    Brandi Collins is a 57 y.o. female with medical history of hypertension, depression, bipolar disorder, ADHD.  Patient reports to ED for evaluation of low back pain.  Patient reports that this pain began on 8/7 at work.  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  Patient states that when the pain occurred she was not doing any heavy lifting, pulling or pushing.  She reports that the pain is been steady and progressively worsening ever since onset on 8/7.  Patient reports that she went to urgent care on 8/8 and was given Toradol shot and muscle relaxers.  She states that she is been taking the muscle relaxer along with Tylenol but it is not alleviating her back pain.  She states that the pain is only bad when she is sitting in her car.  She states the pain is worse with movement, better with rest.  She denies fevers, lower extremity weakness, groin numbness, bowel or bladder incontinence associated with her low back pain.  She denies a history of low back pain.  She states that she is also trying heating pad at home which is not alleviating her pain.  She reports that she does not know what else to do.  She denies a history of pilonidal abscess, denies dysuria, fever, numbness or weakness.   Back Pain Associated symptoms: no dysuria, no fever, no numbness and no weakness        Home Medications Prior to Admission medications   Medication Sig Start Date End Date Taking? Authorizing Provider  lidocaine (LIDODERM) 5 % Place 1 patch onto the skin daily. Remove & Discard patch within 12 hours or as directed by MD 12/31/22  Yes Al Decant, PA-C  cyclobenzaprine (FLEXERIL) 5 MG tablet Take 1 tablet (5 mg total) by mouth 3 (three) times daily as needed for muscle spasms. Do not take with alcohol  or while driving or operating heavy machinery.  May cause drowsiness. 12/28/22   Valentino Nose, NP  FLUoxetine (PROZAC) 10 MG capsule Take by mouth. 10/04/22 01/02/23  [provider]  hydrochlorothiazide (HYDRODIURIL) 25 MG tablet Take 1 tablet by mouth daily. 09/07/22 09/07/23  [provider]  NIFEdipine (ADALAT CC) 30 MG 24 hr tablet Take 30 mg by mouth daily.    [provider]  penicillin v potassium (VEETID) 500 MG tablet Take 500 mg by mouth 4 (four) times daily. 07/30/22   [provider]  topiramate (TOPAMAX) 50 MG tablet Take 50 mg by mouth at bedtime. 12/20/22   [provider]      Allergies    Patient has no known allergies.    Review of Systems   Review of Systems  Constitutional:  Negative for fever.  Genitourinary:  Negative for dysuria.  Musculoskeletal:  Positive for back pain.  Neurological:  Negative for weakness and numbness.  All other systems reviewed and are negative.   Physical Exam Updated Vital Signs BP 135/89 (BP Location: Right Arm)   Pulse 74   Temp 98.1 F (36.7 C) (Oral)   Resp 16   Ht 5\' 9"  (1.753 m)   Wt 96.6 kg   SpO2 98%   BMI 31.45 kg/m  Physical Exam Vitals and nursing note reviewed.  Constitutional:  General: She is not in acute distress.    Appearance: Normal appearance. She is not ill-appearing, toxic-appearing or diaphoretic.  HENT:     Head: Normocephalic and atraumatic.     Nose: Nose normal.     Mouth/Throat:     Mouth: Mucous membranes are moist.     Pharynx: Oropharynx is clear.  Eyes:     Extraocular Movements: Extraocular movements intact.     Conjunctiva/sclera: Conjunctivae normal.     Pupils: Pupils are equal, round, and reactive to light.  Cardiovascular:     Rate and Rhythm: Normal rate and regular rhythm.  Pulmonary:     Effort: Pulmonary effort is normal.     Breath sounds: Normal breath sounds. No wheezing.  Abdominal:     General: Abdomen is flat. Bowel sounds  are normal.     Palpations: Abdomen is soft.     Tenderness: There is no abdominal tenderness.  Musculoskeletal:       Arms:     Cervical back: Normal range of motion and neck supple. No tenderness.     Comments: Pain in this area.  No overlying skin change to patient lumbar spine.  Patient is a forage motion of lumbar spine.  Straight leg raise negative bilaterally.  Skin:    General: Skin is warm and dry.     Capillary Refill: Capillary refill takes less than 2 seconds.  Neurological:     General: No focal deficit present.     Mental Status: She is alert and oriented to person, place, and time.     GCS: GCS eye subscore is 4. GCS verbal subscore is 5. GCS motor subscore is 6.     Cranial Nerves: Cranial nerves 2-12 are intact. No cranial nerve deficit.     Sensory: Sensation is intact. No sensory deficit.     Motor: Motor function is intact. No weakness.     Comments: 5 out of 5 strength bilateral lower extremities     ED Results / Procedures / Treatments   Labs (all labs ordered are listed, but only abnormal results are displayed) Labs Reviewed - No data to display  EKG None  Radiology DG Lumbar Spine Complete  Result Date: 12/31/2022 CLINICAL DATA:  Back pain. EXAM: LUMBAR SPINE - COMPLETE 5 VIEW COMPARISON:  Lumbar spine radiographs 03/27/2016 FINDINGS: Five non rib-bearing lumbar type vertebral bodies are present. The vertebral body heights and alignment are normal. Facet degenerative changes are most evident at L4-5 and L5-S1 on the left. IMPRESSION: Facet degenerative changes at L4-5 and L5-S1 on the left. No acute abnormality. Electronically Signed   By: Marin Roberts M.D.   On: 12/31/2022 15:21    Procedures Procedures   Medications Ordered in ED Medications  lidocaine (LIDODERM) 5 % 1 patch (1 patch Transdermal Patch Applied 12/31/22 1655)  ketorolac (TORADOL) 15 MG/ML injection 15 mg (15 mg Intravenous Given 12/31/22 1654)  acetaminophen (TYLENOL) tablet 650  mg (650 mg Oral Given 12/31/22 1654)    ED Course/ Medical Decision Making/ A&P  Medical Decision Making Amount and/or Complexity of Data Reviewed Radiology: ordered.  Risk OTC drugs. Prescription drug management.   57 year old female presents to ED for evaluation.  Please see HPI for further details.  On examination the patient is afebrile and nontachycardic.  Her lung sounds are clear bilaterally and she is not hypoxic on room air.  Abdomen is soft and compressible throughout.  Neurological examination at baseline without focal neurodeficits.  Patient has 5 out of  5 strength bilateral lower extremities.  She has tenderness to her low back that does not radiate.  Patient x-ray imaging of lumbar spine shows no obvious deformity or fracture.  The patient was given 50 mg of Toradol one-time.  Patient advised that due to CKD she should not be taking NSAIDs consistently.  The patient was also been Tylenol and Lidoderm patch.  Patient requesting discharge at this time.  Patient requesting Lidoderm patches to be sent with her which is fine.  The patient already has muscle relaxers at home.  Patient be advised to continue taking Tylenol for pain.  Advised to follow-up with her PCP for reevaluation.  She will be set up with a work note as well.  Return precautions given and she voiced understanding.  She had all her questions answered to her satisfaction.  She is stable to discharge home at this time.   Final Clinical Impression(s) / ED Diagnoses Final diagnoses:  Acute midline low back pain without sciatica    Rx / DC Orders ED Discharge Orders          Ordered    lidocaine (LIDODERM) 5 %  Every 24 hours        12/31/22 1710              Al Decant, PA-C 12/31/22 1710    Franne Forts, DO 01/03/23 [phone]

## 2022-12-31 NOTE — Discharge Instructions (Signed)
It was a pleasure taking part in your care today.  As we discussed, the x-ray of your back was unremarkable.  He was likely have musculoskeletal pain from a result of your job.  I am writing you a work note excusing from work until Wednesday.  Please continue taking Tylenol every 6 hours as needed for pain.  Please also continue taking muscle relaxers twice daily as needed.  Please pick up lidocaine patches and use them as prescribed.  Return to the ED with any new or worsening signs or symptoms such as groin numbness, lower extremity weakness or bowel or bladder incontinence.

## 2022-12-31 NOTE — ED Triage Notes (Signed)
Pt c/o tailbone painx4d. Pt denies injury. Pt states the muscle relaxer and heating pad is not helping the pain. Pt denies numbness and tingling. Pt denies loss of bowel or bladder.

## 2023-01-02 ENCOUNTER — Encounter (HOSPITAL_COMMUNITY): Payer: Self-pay | Admitting: Emergency Medicine

## 2023-01-02 ENCOUNTER — Emergency Department (HOSPITAL_COMMUNITY): Payer: Medicaid Other

## 2023-01-02 ENCOUNTER — Ambulatory Visit (HOSPITAL_COMMUNITY)
Admission: EM | Admit: 2023-01-02 | Discharge: 2023-01-02 | Disposition: A | Discharge status: Home / Self Care | Payer: Medicaid Other

## 2023-01-02 ENCOUNTER — Other Ambulatory Visit: Payer: Self-pay

## 2023-01-02 ENCOUNTER — Encounter (HOSPITAL_COMMUNITY): Payer: Self-pay

## 2023-01-02 ENCOUNTER — Emergency Department (HOSPITAL_COMMUNITY)
Admission: EM | Admit: 2023-01-02 | Discharge: 2023-01-02 | Disposition: A | Discharge status: Home / Self Care | Payer: Medicaid Other | Attending: Emergency Medicine | Admitting: Emergency Medicine

## 2023-01-02 DIAGNOSIS — R159 Full incontinence of feces: Secondary | ICD-10-CM | POA: Diagnosis not present

## 2023-01-02 DIAGNOSIS — M545 Low back pain, unspecified: Secondary | ICD-10-CM | POA: Diagnosis not present

## 2023-01-02 DIAGNOSIS — R200 Anesthesia of skin: Secondary | ICD-10-CM | POA: Diagnosis not present

## 2023-01-02 DIAGNOSIS — G834 Cauda equina syndrome: Secondary | ICD-10-CM | POA: Diagnosis not present

## 2023-01-02 DIAGNOSIS — I10 Essential (primary) hypertension: Secondary | ICD-10-CM | POA: Insufficient documentation

## 2023-01-02 DIAGNOSIS — R531 Weakness: Secondary | ICD-10-CM | POA: Diagnosis not present

## 2023-01-02 LAB — URINALYSIS, ROUTINE W REFLEX MICROSCOPIC
Bilirubin Urine: NEGATIVE
Glucose, UA: NEGATIVE mg/dL
Hgb urine dipstick: NEGATIVE
Ketones, ur: NEGATIVE mg/dL
Nitrite: NEGATIVE
Protein, ur: NEGATIVE mg/dL
Specific Gravity, Urine: 1.02 (ref 1.005–1.030)
pH: 7 (ref 5.0–8.0)

## 2023-01-02 LAB — CBC WITH DIFFERENTIAL/PLATELET
Abs Immature Granulocytes: 0.02 10*3/uL (ref 0.00–0.07)
Basophils Absolute: 0.1 10*3/uL (ref 0.0–0.1)
Basophils Relative: 1 %
Eosinophils Absolute: 0.2 10*3/uL (ref 0.0–0.5)
Eosinophils Relative: 2 %
HCT: 42.6 % (ref 36.0–46.0)
Hemoglobin: 13.4 g/dL (ref 12.0–15.0)
Immature Granulocytes: 0 %
Lymphocytes Relative: 30 %
Lymphs Abs: 2.3 10*3/uL (ref 0.7–4.0)
MCH: 28.7 pg (ref 26.0–34.0)
MCHC: 31.5 g/dL (ref 30.0–36.0)
MCV: 91.2 fL (ref 80.0–100.0)
Monocytes Absolute: 0.7 10*3/uL (ref 0.1–1.0)
Monocytes Relative: 8 %
Neutro Abs: 4.6 10*3/uL (ref 1.7–7.7)
Neutrophils Relative %: 59 %
Platelets: 227 10*3/uL (ref 150–400)
RBC: 4.67 MIL/uL (ref 3.87–5.11)
RDW: 15.7 % — ABNORMAL HIGH (ref 11.5–15.5)
WBC: 7.8 10*3/uL (ref 4.0–10.5)
nRBC: 0 % (ref 0.0–0.2)

## 2023-01-02 LAB — COMPREHENSIVE METABOLIC PANEL
ALT: 20 U/L (ref 0–44)
AST: 20 U/L (ref 15–41)
Albumin: 3.6 g/dL (ref 3.5–5.0)
Alkaline Phosphatase: 57 U/L (ref 38–126)
Anion gap: 11 (ref 5–15)
BUN: 17 mg/dL (ref 6–20)
CO2: 25 mmol/L (ref 22–32)
Calcium: 9 mg/dL (ref 8.9–10.3)
Chloride: 106 mmol/L (ref 98–111)
Creatinine, Ser: 1.51 mg/dL — ABNORMAL HIGH (ref 0.44–1.00)
GFR, Estimated: 40 mL/min — ABNORMAL LOW (ref >60)
Glucose, Bld: 100 mg/dL — ABNORMAL HIGH (ref 70–99)
Potassium: 3.8 mmol/L (ref 3.5–5.1)
Sodium: 142 mmol/L (ref 135–145)
Total Bilirubin: 0.2 mg/dL — ABNORMAL LOW (ref 0.3–1.2)
Total Protein: 6.8 g/dL (ref 6.5–8.1)

## 2023-01-02 MED ORDER — DEXAMETHASONE SODIUM PHOSPHATE 10 MG/ML IJ SOLN
10.0000 mg | Freq: Once | INTRAMUSCULAR | Status: DC
  Filled 2023-01-02: qty 1

## 2023-01-02 MED ORDER — GADOBUTROL 1 MMOL/ML IV SOLN
10.0000 mL | Freq: Once | INTRAVENOUS | Status: AC | PRN
  Administered 2023-01-02: 10 mL via INTRAVENOUS

## 2023-01-02 MED ORDER — PREDNISONE 20 MG PO TABS: 40.0000 mg | ORAL_TABLET | Freq: Every day | ORAL | 0 refills | Status: AC

## 2023-01-02 MED ORDER — KETOROLAC TROMETHAMINE 15 MG/ML IJ SOLN
15.0000 mg | Freq: Once | INTRAMUSCULAR | Status: AC
  Administered 2023-01-02: 15 mg via INTRAVENOUS
  Filled 2023-01-02: qty 1

## 2023-01-02 NOTE — ED Notes (Signed)
Patient returned from MRI, RN gave urine specimen cup to attempt to give urine.

## 2023-01-02 NOTE — ED Provider Notes (Signed)
[place] EMERGENCY DEPARTMENT AT [facility] Provider Note   CSN: [id number] Arrival date & time: 01/02/23  1027     History  Chief Complaint  Patient presents with   Back Pain   Incontinent BM    Brandi Collins is a 57 y.o. female.  With past medical history of hypertension, bipolar who presents to the emergency department with back pain from urgent care.  States symptoms began on 12/27/22.  She describes having onset of mid low back pain.  Pain has been progressively worsening since then and is now severe.  She states that about 2 days ago she began having numbness to the back of her thighs and numbness and tingling in her feet.  She states that she had been to the ER in urgent care twice during this period of time and received an x-ray which was negative and received pain medication.  She went to urgent care again today, and while there had an episode of fecal incontinence.  She also states that she noticed the urge to pee but had urinated on herself a small amount prior to being able to go to the restroom.  On questioning, she does endorse some mild numbness to the groin.  No history of back trauma or previous back surgeries.  No IV drug use or fevers.  No recent falls.  On chart review, seen at urgent care this morning with low back pain.  This morning was complaining of inner thigh numbness.  She had the sensation of needing to urinate at urgent care and when she went to the bathroom had an episode of fecal incontinence.  Previous to that was seen in the ED on 12/31/2022 for same.  They had a plain film performed of her low back which showed some degenerative changes but no acute findings.  Had no cauda equina symptoms at that time.  Was given Toradol, Tylenol, lidocaine patch.   Back Pain Associated symptoms: numbness and weakness        Home Medications Prior to Admission medications   Medication Sig Start Date End Date Taking? Authorizing Provider  predniSONE  (DELTASONE) 20 MG tablet Take 2 tablets (40 mg total) by mouth daily for 4 days. 01/03/23 01/07/23 Yes Cristopher Peru, PA-C  cyclobenzaprine (FLEXERIL) 5 MG tablet Take 1 tablet (5 mg total) by mouth 3 (three) times daily as needed for muscle spasms. Do not take with alcohol or while driving or operating heavy machinery.  May cause drowsiness. 12/28/22   Valentino Nose, NP  FLUoxetine (PROZAC) 10 MG capsule Take by mouth. 10/04/22 01/02/23  [provider]  hydrochlorothiazide (HYDRODIURIL) 25 MG tablet Take 1 tablet by mouth daily. 09/07/22 09/07/23  [provider]  lidocaine (LIDODERM) 5 % Place 1 patch onto the skin daily. Remove & Discard patch within 12 hours or as directed by MD 12/31/22   Al Decant, PA-C  NIFEdipine (ADALAT CC) 30 MG 24 hr tablet Take 30 mg by mouth daily.    [provider]  penicillin v potassium (VEETID) 500 MG tablet Take 500 mg by mouth 4 (four) times daily. 07/30/22   [provider]  topiramate (TOPAMAX) 50 MG tablet Take 50 mg by mouth at bedtime. 12/20/22   [provider]      Allergies    Patient has no known allergies.    Review of Systems   Review of Systems  Genitourinary:  Positive for urgency.  Musculoskeletal:  Positive for back pain  and gait problem.  Neurological:  Positive for weakness and numbness.  All other systems reviewed and are negative.   Physical Exam Updated Vital Signs BP 110/72   Pulse 65   Temp 98.2 F (36.8 C) (Oral)   Resp 17   SpO2 96%  Physical Exam Vitals and nursing note reviewed.  Constitutional:      General: She is not in acute distress.    Appearance: Normal appearance. She is not ill-appearing or toxic-appearing.  HENT:     Head: Normocephalic.  Eyes:     General: No scleral icterus. Cardiovascular:     Rate and Rhythm: Normal rate and regular rhythm.  Pulmonary:     Effort: Pulmonary effort is normal.     Breath sounds: Normal breath sounds.  Abdominal:      General: Bowel sounds are normal.     Palpations: Abdomen is soft.  Musculoskeletal:     Cervical back: Normal range of motion and neck supple.  Skin:    General: Skin is warm and dry.     Capillary Refill: Capillary refill takes less than 2 seconds.  Neurological:     General: No focal deficit present.     Mental Status: She is alert and oriented to person, place, and time.     Cranial Nerves: Cranial nerves 2-12 are intact.     Sensory: No sensory deficit.     Motor: Weakness present.     Comments: Strength is 5/5 in the bilateral upper extremities. Strength is 3/5 in the bilateral lower extremities.  Sensation intact. Midline L-spine tenderness to palpation without any obvious step-offs or deformities.  Psychiatric:        Mood and Affect: Mood normal.        Behavior: Behavior normal.     ED Results / Procedures / Treatments   Labs (all labs ordered are listed, but only abnormal results are displayed) Labs Reviewed  COMPREHENSIVE METABOLIC PANEL - Abnormal; Notable for the following components:      Result Value   Glucose, Bld 100 (*)    Creatinine, Ser 1.51 (*)    Total Bilirubin 0.2 (*)    GFR, Estimated 40 (*)    All other components within normal limits  CBC WITH DIFFERENTIAL/PLATELET - Abnormal; Notable for the following components:   RDW 15.7 (*)    All other components within normal limits  URINALYSIS, ROUTINE W REFLEX MICROSCOPIC - Abnormal; Notable for the following components:   Leukocytes,Ua TRACE (*)    Bacteria, UA RARE (*)    All other components within normal limits    EKG None  Radiology MR Lumbar Spine W Wo Contrast (assess for abscess, cord compression)  Result Date: 01/02/2023 CLINICAL DATA:  Low back pain with cauda equina syndrome suspected. Suspected incontinence. Numbness and weakness. EXAM: MRI LUMBAR SPINE WITHOUT AND WITH CONTRAST TECHNIQUE: Multiplanar and multiecho pulse sequences of the lumbar spine were obtained without and with  intravenous contrast. CONTRAST:  10mL GADAVIST GADOBUTROL 1 MMOL/ML IV SOLN COMPARISON:  11/14/2015 FINDINGS: Segmentation:  Standard. Alignment:  Slight degenerative anterolisthesis at L3-4. Vertebrae:  No fracture, evidence of discitis, or bone lesion. Conus medullaris and cauda equina: Conus extends to the T12-L1 level. Conus and cauda equina appear normal. Paraspinal and other soft tissues: Negative for perispinal mass or inflammation. Disc levels: T12- L1: Mild facet spurring without impingement L1-L2: Mild degenerative facet spurring. L2-L3: Degenerative facet spurring to a mild or moderate degree. L3-L4: Degenerative facet spurring with mild anterolisthesis. Slight  disc height loss and bulge with no neural impingement. L4-L5: Mild degenerative facet spurring. L5-S1:Degenerative facet spurring to a moderate degree on the left. Left paracentral protrusion impinging on the descending left S1 nerve root. The foramina are patent IMPRESSION: 1. Generalized lumbar spine degeneration especially affecting facets with mild L3-4 anterolisthesis. 2. L5-S1 left paracentral herniation impinging on the left S1 nerve root Electronically Signed   By: Tiburcio Pea M.D.   On: 01/02/2023 14:50    Procedures Procedures   Medications Ordered in ED Medications  dexamethasone (DECADRON) injection 10 mg (has no administration in time range)  ketorolac (TORADOL) 15 MG/ML injection 15 mg (15 mg Intravenous Given 01/02/23 1117)  gadobutrol (GADAVIST) 1 MMOL/ML injection 10 mL (10 mLs Intravenous Contrast Given 01/02/23 1409)    ED Course/ Medical Decision Making/ A&P Clinical Course as of 01/02/23 1521  Tue Jan 02, 2023  1249 Recheck. Pain is still present after toradol, but patient does not want narcotic pain medication. States she would like to hold off for now.  [LA]  1254 Pre void bladder scan: 90-149mL. Post-void 9-83mL [LA]    Clinical Course User Index [LA] Cristopher Peru, PA-C    Medical Decision  Making Amount and/or Complexity of Data Reviewed Labs: ordered. Radiology: ordered.  Risk Prescription drug management.  Initial Impression and Ddx 57 year old female who presents to the emergency department with back pain Patient PMH that increases complexity of ED encounter: Hypertension Differential: Fracture, subluxation, musculoskeletal strain, epidural abscess, cauda equina, muscle spasm, sciatica or radiculopathy, etc.    Interpretation of Diagnostics I independent reviewed and interpreted the labs as followed: CBC without leukocytosis or anemia.  CMP with creatinine of 1.51, appears to be more chronically elevated recently.  UA without UTI  - I independently visualized the following imaging with scope of interpretation limited to determining acute life threatening conditions related to emergency care: MRI with and without lumbar spine, which revealed no cauda equina.  Patient Reassessment and Ultimate Disposition/Management 57 year old female who presents to the emergency department with back pain.  Her symptoms are concerning for cauda equina.  I have ordered labs, MRI.  I offered her opiate pain control for her severe back pain which she declined.  She is agreeable to Toradol at this time.  I have instructed nursing to attempt a postvoid residual bladder scan.  If unable to do so we will get a regular bladder scan.  CBC and CMP with no significant findings. UA without UTI  MRI does not show evidence of cauda equina.  She does have general lumbar spine degeneration as well as L5-S1 left paracentral herniation impinging on the left S1 nerve root.  No other acute findings.  No IV drug use or fever concerning for epidural abscess.  No acute fractures.  No evidence of cauda equina on MRI.  Do not feel that this is likely muscle spasm.  She does not have specific sciatica down 1 leg, rather general numbness.  There is no focal weakness.  Will give her a dose of Decadron here in the  emergency department.  Will also send a referral to neurosurgery and week of steroids to help with symptoms.  She does not wish to have any narcotic pain medication at this time.  We did discuss using ibuprofen or Tylenol for pain relief as well as resting, heat and massage.  She verbalized understanding.  Given return precautions for worsening symptoms.  Otherwise feel that she is appropriate for discharge.  The patient has been  appropriately medically screened and/or stabilized in the ED. I have low suspicion for any other emergent medical condition which would require further screening, evaluation or treatment in the ED or require inpatient management. At time of discharge the patient is hemodynamically stable and in no acute distress. I have discussed work-up results and diagnosis with patient and answered all questions. Patient is agreeable with discharge plan. We discussed strict return precautions for returning to the emergency department and they verbalized understanding.    Patient management required discussion with the following services or consulting groups:  None  Complexity of Problems Addressed Acute complicated illness or Injury  Additional Data Reviewed and Analyzed Further history obtained from: Past medical history and medications listed in the EMR, Prior ED visit notes, and Care Everywhere  Patient Encounter Risk Assessment Prescriptions, SDOH impact on management, and Consideration of hospitalization  Final Clinical Impression(s) / ED Diagnoses Final diagnoses:  Acute midline low back pain without sciatica    Rx / DC Orders ED Discharge Orders          Ordered    predniSONE (DELTASONE) 20 MG tablet  Daily        01/02/23 1500    Ambulatory referral to Neurosurgery        01/02/23 1500              Cristopher Peru, PA-C 01/02/23 1523    Long, Arlyss Repress, MD 01/05/23 [phone]

## 2023-01-02 NOTE — ED Triage Notes (Signed)
Pt states she is having lower back pain with numbness on her legs and some incontinents on urine. Pt is been seen multiple times for same and is requesting referral to a neurologist.

## 2023-01-02 NOTE — Discharge Instructions (Addendum)
[facility] [address] [place], [address] [place], [place] [address] [phone]   Dr Jordan Likes is the surgeon on call if the scan is negative, give the office a call

## 2023-01-02 NOTE — ED Notes (Signed)
Patient transported to MRI 

## 2023-01-02 NOTE — Discharge Instructions (Addendum)
You were seen in the emergency department today for low back pain.  Your MRI does show that you have a herniated disc.  I am sending a referral to neurosurgery for them to evaluate you.  I am also starting you on steroids for the next week.  Please return to the emergency department if you have significantly worsening symptoms.

## 2023-01-02 NOTE — ED Provider Notes (Signed)
[facility]    CSN: [id number] Arrival date & time: 01/02/23  0854      History   Chief Complaint Chief Complaint  Patient presents with   Back Pain    HPI Brandi Collins is a 57 y.o. female who presents with unresolved central lower back pain and was seen twice on 08/08 and 08/11 but at that time she did not have radicular symptoms. Since this am she has  bilateral inner thigh numbness and both feet feel numb. While here she thought she had to void, and when she went to the  bathroom, she had fecal incontinence on herself which she did not feel it coming. Pt spoke with [facility] and was told she needs a referral from Urgent care or ER to be seen there. She does not want to go to ER.   Past Medical History:  Diagnosis Date   ADHD (attention deficit hyperactivity disorder)    Bipolar disorder (HCC)    Depression    Hypertension    Lateral meniscus tear    left    Patient Active Problem List   Diagnosis Date Noted   Synovitis of knee    Acute lateral meniscus tear of left knee 04/08/2019   Screening examination for pulmonary tuberculosis 05/05/2016   Acute bilateral low back pain without sciatica 03/27/2016   Health care maintenance 04/22/2015   Essential hypertension, benign 04/22/2015   Annual physical exam 04/28/2013   Pap smear for cervical cancer screening 04/28/2013   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     OB History   No obstetric history on file.      Home Medications    Prior to Admission medications   Medication Sig Start Date End Date Taking? Authorizing  Provider  cyclobenzaprine (FLEXERIL) 5 MG tablet Take 1 tablet (5 mg total) by mouth 3 (three) times daily as needed for muscle spasms. Do not take with alcohol or while driving or operating heavy machinery.  May cause drowsiness. 12/28/22   Valentino Nose, NP  FLUoxetine (PROZAC) 10 MG capsule Take by mouth. 10/04/22 01/02/23  [provider]  hydrochlorothiazide (HYDRODIURIL) 25 MG tablet Take 1 tablet by mouth daily. 09/07/22 09/07/23  [provider]  lidocaine (LIDODERM) 5 % Place 1 patch onto the skin daily. Remove & Discard patch within 12 hours or as directed by MD 12/31/22   Al Decant, PA-C  NIFEdipine (ADALAT CC) 30 MG 24 hr tablet Take 30 mg by mouth daily.    [provider]  penicillin v potassium (VEETID) 500 MG tablet Take 500 mg by mouth 4 (four) times daily. 07/30/22   [provider]  topiramate (TOPAMAX) 50 MG tablet Take 50 mg by mouth at bedtime. 12/20/22   [provider]    Family History Family History  Problem Relation Age of Onset   Hypertension Mother    Heart disease Mother    Stroke Mother    Hypertension Daughter     Social History Social History   Tobacco Use   Smoking status: Never   Smokeless tobacco: Never  Vaping Use  Vaping status: Never Used  Substance Use Topics   Alcohol use: No   Drug use: Not Currently    Types: Marijuana    Comment: last used marijuana 2 days ago     Allergies   Patient has no known allergies.   Review of Systems Review of Systems As noted in HPI  Physical Exam Triage Vital Signs ED Triage Vitals  Encounter Vitals Group     BP 01/02/23 0932 119/78     Systolic BP Percentile --      Diastolic BP Percentile --      Pulse Rate 01/02/23 0932 75     Resp 01/02/23 0932 15     Temp 01/02/23 0932 98.3 F (36.8 C)     Temp Source 01/02/23 0932 Oral     SpO2 01/02/23 0932 95 %     Weight 01/02/23 0933 213 lb 13.5 oz (97 kg)     Height 01/02/23 0933 5\' 9"   (1.753 m)     Head Circumference --      Peak Flow --      Pain Score 01/02/23 0932 10     Pain Loc --      Pain Education --      Exclude from Growth Chart --    No data found.  Updated Vital Signs BP 119/78 (BP Location: Right Arm)   Pulse 75   Temp 98.3 F (36.8 C) (Oral)   Resp 15   Ht 5\' 9"  (1.753 m)   Wt 213 lb 13.5 oz (97 kg)   SpO2 95%   BMI 31.58 kg/m   Visual Acuity Right Eye Distance:   Left Eye Distance:   Bilateral Distance:    Right Eye Near:   Left Eye Near:    Bilateral Near:     Physical Exam Vitals and nursing note reviewed.  HENT:     Right Ear: External ear normal.     Left Ear: External ear normal.  Eyes:     General: No scleral icterus.    Conjunctiva/sclera: Conjunctivae normal.  Pulmonary:     Effort: Pulmonary effort is normal.  Musculoskeletal:     Cervical back: Neck supple.     Comments: BACK - with local tenderness on lower lumbar region, centrally with palpation. ROM provoked pain. + SLR  Skin:    General: Skin is warm and dry.  Neurological:     Mental Status: She is alert and oriented to person, place, and time.     Motor: No weakness.     Gait: Gait normal.     Deep Tendon Reflexes: Reflexes normal.  Psychiatric:        Mood and Affect: Mood normal.        Behavior: Behavior normal.        Thought Content: Thought content normal.        Judgment: Judgment normal.      UC Treatments / Results  Labs (all labs ordered are listed, but only abnormal results are displayed) Labs Reviewed - No data to display  EKG   Radiology DG Lumbar Spine Complete  Result Date: 12/31/2022 CLINICAL DATA:  Back pain. EXAM: LUMBAR SPINE - COMPLETE 5 VIEW COMPARISON:  Lumbar spine radiographs 03/27/2016 FINDINGS: Five non rib-bearing lumbar type vertebral bodies are present. The vertebral body heights and alignment are normal. Facet degenerative changes are most evident at L4-5 and L5-S1 on the left. IMPRESSION: Facet degenerative changes  at L4-5 and L5-S1 on the left. No acute  abnormality. Electronically Signed   By: Marin Roberts M.D.   On: 12/31/2022 15:21    Procedures Procedures (including critical care time)  Medications Ordered in UC Medications - No data to display  Initial Impression / Assessment and Plan / UC Course  I have reviewed the triage vital signs and the nursing notes. I have read her prior back pain visits I called a neurology and neurosurgeon group to get her worked in. The neurologist suggested pt consult with a neurosurgeon. I spoke with Dr Lindalou Hose office and they said without a scan pt can't be worked in and advised to goes to ER.  She was sent to ER and if the scan is negative, she can call Dr Lindalou Hose office( per the receptionist) and make an appointment.  Pt agreed to this plan and will go right now. Her daughter-in -law was present during the visit.   Final Clinical Impressions(s) / UC Diagnoses   Final diagnoses:  Cauda equina syndrome with neurogenic bladder [facility])  Full incontinence of feces     Discharge Instructions      [facility] [address] [place], [address] [place], [place] [address] [phone]   Dr Jordan Likes is the surgeon on call if the scan is negative, give the office a call     ED Prescriptions   None    I have reviewed the PDMP during this encounter.   Garey Ham, PA-C 01/02/23 1027

## 2023-01-02 NOTE — ED Triage Notes (Signed)
Pt came in via POV from UC d/t recent back pain since the 7th this month. Stated she was seen by UC then & was given Toradol & the she came to ED yesterday & was given the same med plus a pain patch then d/c. Then she came back today d/t an incontinence of BM today plus feeling numb in the back of her thighs on both legs. A/Ox4, rates her lower mid back pain 10/10.

## 2023-01-10 ENCOUNTER — Ambulatory Visit: Payer: Medicaid Other | Attending: Nurse Practitioner | Admitting: Nurse Practitioner

## 2023-01-10 ENCOUNTER — Encounter: Payer: Self-pay | Admitting: Nurse Practitioner

## 2023-01-10 VITALS — BP 115/75 | HR 80 | Ht 69.0 in | Wt 220.6 lb

## 2023-01-10 DIAGNOSIS — Z1211 Encounter for screening for malignant neoplasm of colon: Secondary | ICD-10-CM

## 2023-01-10 DIAGNOSIS — M545 Low back pain, unspecified: Secondary | ICD-10-CM

## 2023-01-10 DIAGNOSIS — Z7689 Persons encountering health services in other specified circumstances: Secondary | ICD-10-CM

## 2023-01-10 DIAGNOSIS — G8929 Other chronic pain: Secondary | ICD-10-CM

## 2023-01-10 DIAGNOSIS — I10 Essential (primary) hypertension: Secondary | ICD-10-CM | POA: Diagnosis not present

## 2023-01-10 DIAGNOSIS — Z23 Encounter for immunization: Secondary | ICD-10-CM | POA: Diagnosis not present

## 2023-01-10 DIAGNOSIS — R7303 Prediabetes: Secondary | ICD-10-CM | POA: Diagnosis not present

## 2023-01-10 DIAGNOSIS — E669 Obesity, unspecified: Secondary | ICD-10-CM

## 2023-01-10 MED ORDER — CYCLOBENZAPRINE HCL 10 MG PO TABS
10.0000 mg | ORAL_TABLET | Freq: Three times a day (TID) | ORAL | 1 refills | Status: DC | PRN
Start: 2023-01-10 — End: 2023-03-09

## 2023-01-10 NOTE — Progress Notes (Signed)
Assessment & Plan:  Diagnoses and all orders for this visit:  Encounter to establish care  Prediabetes -     Hemoglobin A1c  Primary hypertension Continue nifedipine and HCTZ as prescribed.  Reminded to bring in blood pressure log for follow  up appointment.  RECOMMENDATIONS: DASH/Mediterranean Diets are healthier choices for HTN.    Need for shingles vaccine -     Varicella-zoster vaccine IM  Colon cancer screening -     Ambulatory referral to Gastroenterology  Obesity (BMI 30-39.9) -     Amb ref to Medical Nutrition Therapy-MNT  Chronic bilateral low back pain without sciatica -     cyclobenzaprine (FLEXERIL) 10 MG tablet; Take 1 tablet (10 mg total) by mouth 3 (three) times daily as needed for muscle spasms. Do not take with alcohol or while driving or operating heavy machinery.  May cause drowsiness.    Patient has been counseled on age-appropriate routine health concerns for screening and prevention. These are reviewed and up-to-date. Referrals have been placed accordingly. Immunizations are up-to-date or declined.    Subjective:  No chief complaint on file.  HPI Brandi Collins 57 y.o. female presents to office today to establish care. She was a previous patient of [facility].  She has a past medical history of ADHD, Bipolar disorder, Depression, Hypertension, prediabetes, low back pain, and Lateral meniscus tear.  Currently being followed by [facility] for her mental health    Chronic Back pain She has an appointment with neurosurgery on the 29th of August for her back pain. She is requesting to increase the dosage of flexeril for her back pain. Based on review of records she has been dealing with back pain and cervical neck pain and seen in the ER for this on numerous occasions in the past as far back as 2017. She does have a history of bowel and bladder incontinence associated with her back pain.  MRI 01-02-2023 1. 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 2. 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  BP Readings from Last 3 Encounters:  01/10/23 115/75  01/02/23 133/89  01/02/23 119/78    Prediabetes Last A1c 5.8 on 12-07-2022. Well controlled with diet only at this time. She would like to be referred to a nutritionist to help her with weight loss. BMI 32.58  Review of Systems  Constitutional:  Negative for fever, malaise/fatigue and weight loss.  HENT: Negative.  Negative for nosebleeds.   Eyes: Negative.  Negative for blurred vision, double vision and photophobia.  Respiratory: Negative.  Negative for cough and shortness of breath.   Cardiovascular: Negative.  Negative for chest pain, palpitations and leg swelling.  Gastrointestinal: Negative.  Negative for heartburn, nausea and vomiting.  Musculoskeletal:  Positive for back pain. Negative for myalgias.  Neurological: Negative.  Negative for dizziness, focal weakness, seizures and headaches.  Psychiatric/Behavioral: Negative.  Negative for suicidal ideas.     Past Medical History:  Diagnosis Date   ADHD (attention deficit hyperactivity disorder)    Bipolar disorder (HCC)    Depression    Hypertension    Lateral meniscus tear    left    Past Surgical History:  Procedure Laterality Date   KNEE ARTHROSCOPY WITH LATERAL MENISECTOMY Left 04/11/2019   Procedure: LEFT KNEE ARTHROSCOPY WITH PARTIAL LATERAL MENISECTOMY;  Surgeon: Tarry Kos, MD;  Location: [facility];  Service: Orthopedics;  Laterality: Left;   THROAT SURGERY     patient was 57 years old.     Family History  Problem Relation Age of Onset   Hypertension Mother    Heart disease Mother    Stroke Mother    Hypertension Daughter     Social History Reviewed with no changes to be made today.   Outpatient Medications  Prior to Visit  Medication Sig Dispense Refill   FLUoxetine (PROZAC) 10 MG capsule Take by mouth.     hydrochlorothiazide (HYDRODIURIL) 25 MG tablet Take 1 tablet by mouth daily.     NIFEdipine (ADALAT CC) 30 MG 24 hr tablet Take 30 mg by mouth daily.     topiramate (TOPAMAX) 50 MG tablet Take 50 mg by mouth at bedtime.     cyclobenzaprine (FLEXERIL) 5 MG tablet Take 1 tablet (5 mg total) by mouth 3 (three) times daily as needed for muscle spasms. Do not take with alcohol or while driving or operating heavy machinery.  May cause drowsiness. 30 tablet 0   lidocaine (LIDODERM) 5 % Place 1 patch onto the skin daily. Remove & Discard patch within 12 hours or as directed by MD 30 patch 0   penicillin v potassium (VEETID) 500 MG tablet Take 500 mg by mouth 4 (four) times daily.     No facility-administered medications prior to visit.    No Known Allergies     Objective:    BP 115/75 (BP Location: Left Arm, Patient Position: Sitting, Cuff Size: Normal)   Pulse 80   Ht 5\' 9"  (1.753 m)   Wt 220 lb 9.6 oz (100.1 kg)   SpO2 97%   BMI 32.58 kg/m  Wt Readings from Last 3 Encounters:  01/10/23 220 lb 9.6 oz (100.1 kg)  01/02/23 213 lb 13.5 oz (97 kg)  12/31/22 212 lb 15.4 oz (96.6 kg)    Physical Exam Vitals and nursing note reviewed.  Constitutional:      Appearance: She is well-developed.  HENT:     Head: Normocephalic and atraumatic.  Cardiovascular:     Rate and Rhythm: Normal rate and regular rhythm.     Heart sounds: Normal heart sounds. No murmur heard.    No friction rub. No gallop.  Pulmonary:     Effort: Pulmonary effort is normal. No tachypnea or respiratory distress.     Breath sounds: Normal breath sounds. No decreased breath sounds, wheezing, rhonchi or rales.  Chest:     Chest wall: No tenderness.  Abdominal:     General: Bowel sounds are normal.     Palpations: Abdomen is soft.  Musculoskeletal:        General: Normal range of motion.     Cervical back: Normal  range of motion.  Skin:    General: Skin is warm and dry.  Neurological:     Mental Status: She is alert and oriented to person, place, and time.     Coordination: Coordination normal.  Psychiatric:        Behavior: Behavior normal. Behavior is cooperative.        Thought Content: Thought content normal.        Judgment: Judgment normal.          Patient has been counseled extensively about nutrition and exercise as well as the importance of adherence with medications and regular follow-up. The patient was given clear instructions to go to ER or return to medical center if symptoms don't improve,  worsen or new problems develop. The patient verbalized understanding.   Follow-up: Return in about 3 months (around 04/12/2023) for htn.   Claiborne Rigg, FNP-BC [facility] [place], [place] [phone]   01/10/2023, 11:58 AM

## 2023-01-11 LAB — HEMOGLOBIN A1C
Est. average glucose Bld gHb Est-mCnc: 117 mg/dL
Hgb A1c MFr Bld: 5.7 % — ABNORMAL HIGH (ref 4.8–5.6)

## 2023-01-26 ENCOUNTER — Ambulatory Visit (HOSPITAL_COMMUNITY)
Admission: EM | Admit: 2023-01-26 | Discharge: 2023-01-26 | Disposition: A | Discharge status: Home / Self Care | Payer: Medicaid Other | Attending: Emergency Medicine | Admitting: Emergency Medicine

## 2023-01-26 ENCOUNTER — Encounter (HOSPITAL_COMMUNITY): Payer: Self-pay

## 2023-01-26 DIAGNOSIS — M545 Low back pain, unspecified: Secondary | ICD-10-CM

## 2023-01-26 MED ORDER — KETOROLAC TROMETHAMINE 30 MG/ML IJ SOLN
INTRAMUSCULAR | Status: AC
  Filled 2023-01-26: qty 1

## 2023-01-26 MED ORDER — KETOROLAC TROMETHAMINE 30 MG/ML IJ SOLN
30.0000 mg | Freq: Once | INTRAMUSCULAR | Status: AC
  Administered 2023-01-26: 30 mg via INTRAMUSCULAR

## 2023-01-26 NOTE — ED Triage Notes (Signed)
Presents to the office for lower back pain. Pt is requesting a steroid shot.

## 2023-01-26 NOTE — ED Provider Notes (Signed)
[facility]    CSN: [id number] Arrival date & time: 01/26/23  1954      History   Chief Complaint Chief Complaint  Patient presents with   Back Pain    HPI Brandi Collins is a 57 y.o. female.   Patient presents to clinic for complaint of low back pain. Pain has been ongoing, she has an appointment soon with neurology for a spinal injection of steroids, has a herniated disc.   She denies numbness, tingling or incontinence. No new injuries.   She is requesting a steroid injection.   The history is provided by the patient and medical records.  Back Pain Associated symptoms: no weakness     Past Medical History:  Diagnosis Date   ADHD (attention deficit hyperactivity disorder)    Bipolar disorder (HCC)    Depression    Hypertension    Lateral meniscus tear    left    Patient Active Problem List   Diagnosis Date Noted   Bipolar disorder, mixed (HCC) 08/22/2022   Synovitis of knee    Acute lateral meniscus tear of left knee 04/08/2019   Pain of cervical spine 03/04/2018   Screening examination for pulmonary tuberculosis 05/05/2016   Acute bilateral low back pain without sciatica 03/27/2016   Health care maintenance 04/22/2015   Essential hypertension, benign 04/22/2015   Annual physical exam 04/28/2013   Pap smear for cervical cancer screening 04/28/2013   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     OB History   No obstetric history on file.      Home Medications    Prior to Admission medications   Medication Sig  Start Date End Date Taking? Authorizing Provider  cyclobenzaprine (FLEXERIL) 10 MG tablet Take 1 tablet (10 mg total) by mouth 3 (three) times daily as needed for muscle spasms. Do not take with alcohol or while driving or operating heavy machinery.  May cause drowsiness. 01/10/23  Yes Claiborne Rigg, NP  hydrochlorothiazide (HYDRODIURIL) 25 MG tablet Take 1 tablet by mouth daily. 09/07/22 09/07/23 Yes [provider]  NIFEdipine (ADALAT CC) 30 MG 24 hr tablet Take 30 mg by mouth daily.   Yes [provider]  topiramate (TOPAMAX) 50 MG tablet Take 50 mg by mouth at bedtime. 12/20/22  Yes [provider]  FLUoxetine (PROZAC) 10 MG capsule Take by mouth. 10/04/22 01/10/23  [provider]    Family History Family History  Problem Relation Age of Onset   Hypertension Mother    Heart disease Mother    Stroke Mother    Hypertension Daughter     Social History Social History   Tobacco Use   Smoking status: Never   Smokeless tobacco: Never  Vaping Use   Vaping status: Never Used  Substance Use Topics   Alcohol use: No   Drug use: Not Currently    Types: Marijuana    Comment: I don't use marijuana anymore     Allergies   Patient has no known allergies.   Review of Systems Review of Systems  Musculoskeletal:  Positive for back pain.  Neurological:  Negative for weakness.     Physical Exam Triage Vital Signs ED Triage Vitals [01/26/23 2001]  Encounter Vitals Group     BP 115/81     Systolic BP Percentile      Diastolic BP Percentile      Pulse Rate 82     Resp 16     Temp 98.1 F (36.7 C)     Temp Source Oral     SpO2 98 %     Weight      Height      Head Circumference      Peak Flow      Pain Score      Pain Loc      Pain Education      Exclude from Growth Chart    No data found.  Updated Vital Signs BP 115/81 (BP Location: Left Arm)   Pulse 82   Temp 98.1 F (36.7 C) (Oral)   Resp 16   SpO2 98%   Visual Acuity Right  Eye Distance:   Left Eye Distance:   Bilateral Distance:    Right Eye Near:   Left Eye Near:    Bilateral Near:     Physical Exam Vitals and nursing note reviewed.  Constitutional:      Appearance: Normal appearance.  HENT:     Head: Normocephalic and atraumatic.     Right Ear: External ear normal.     Left Ear: External ear normal.     Nose: Nose normal.     Mouth/Throat:     Mouth: Mucous membranes are moist.  Eyes:     Conjunctiva/sclera: Conjunctivae normal.  Cardiovascular:     Rate and Rhythm: Normal rate.  Pulmonary:     Effort: Pulmonary effort is normal. No respiratory distress.  Musculoskeletal:        General: Tenderness present. No swelling, deformity or signs of injury.     Cervical back: Normal.     Thoracic back: Normal.     Lumbar back: Tenderness and bony tenderness present.       Back:     Comments: Midline lumbar TTP. Denies numbness, tingling, or radiation of pain.  Without concern for cauda equina, no saddle anesthesia.  No incontinence.  Atraumatic.  Skin:    General: Skin is warm and dry.  Neurological:     General: No focal deficit present.     Mental Status: She is alert.  Psychiatric:        Mood and Affect: Mood normal.      UC Treatments / Results  Labs (all labs ordered are listed, but only abnormal results are displayed) Labs Reviewed - No data to display  EKG   Radiology No results found.  Procedures Procedures (including critical care time)  Medications Ordered in UC Medications  ketorolac (TORADOL) 30 MG/ML injection 30 mg (30 mg Intramuscular Given 01/26/23 2028)    Initial Impression / Assessment and Plan / UC Course  I have reviewed the triage vital signs and the nursing notes.  Pertinent labs & imaging results that were available during my care of the patient were reviewed by me and considered in my medical decision making (see chart for details).  Vitals and triage reviewed, patient is hemodynamically stable.  Known L5-S1 left paracentral herniation impinging on the left S1 nerve root, neurosurgery appointment 10/13.  Without red flag symptoms of numbness, tingling, cauda equina or incontinence.  Discussed we will  have to withhold steroids as that she is going to get a steroid injection at this appointment, and steroid injections must be spaced out, do not want to interfere with neurosurgery's plan.  Last creatinine was slightly elevated, will give one-time dose of Toradol in clinic for pain management.  Plan of care, follow-up care and return precautions given, no questions at this time.     Final Clinical Impressions(s) / UC Diagnoses   Final diagnoses:  Acute midline low back pain without sciatica     Discharge Instructions      We have given you a one-time dose of Toradol in clinic, if your pain persist you can take 500 mg of Tylenol every 8 hours.  We withheld steroids today, as you are getting a steroid injection in your spine upcoming and any steroids in your system will interfere with this upcoming dose.  Please call neurology on Monday to see if there are any other interventions, or any sooner appointments they can offer.  Return to clinic for any new or urgent symptoms.    ED Prescriptions   None    PDMP not reviewed this encounter.   Emillio Ngo, [place], [place] 01/26/23 2029

## 2023-01-26 NOTE — Discharge Instructions (Signed)
We have given you a one-time dose of Toradol in clinic, if your pain persist you can take 500 mg of Tylenol every 8 hours.  We withheld steroids today, as you are getting a steroid injection in your spine upcoming and any steroids in your system will interfere with this upcoming dose.  Please call neurology on Monday to see if there are any other interventions, or any sooner appointments they can offer.  Return to clinic for any new or urgent symptoms.

## 2023-02-01 ENCOUNTER — Encounter: Payer: Self-pay | Admitting: Skilled Nursing Facility1

## 2023-02-01 ENCOUNTER — Encounter: Payer: Medicaid Other | Attending: Nurse Practitioner | Admitting: Skilled Nursing Facility1

## 2023-02-01 VITALS — Ht 69.0 in | Wt 221.0 lb

## 2023-02-01 DIAGNOSIS — Z6830 Body mass index (BMI) 30.0-30.9, adult: Secondary | ICD-10-CM | POA: Insufficient documentation

## 2023-02-01 DIAGNOSIS — Z713 Dietary counseling and surveillance: Secondary | ICD-10-CM | POA: Diagnosis not present

## 2023-02-01 DIAGNOSIS — E669 Obesity, unspecified: Secondary | ICD-10-CM | POA: Diagnosis present

## 2023-02-01 NOTE — Progress Notes (Signed)
Medical Nutrition Therapy  Appointment Start time:  3:35  Appointment End time:  4:00  Primary concerns today: uncontrolled eating   Referral diagnosis: e66.09   NUTRITION ASSESSMENT    Clinical Medical Hx: bipolar, prediabetes  Medications: see list  Labs: A1C 5.7, creatinine 1.51 Notable Signs/Symptoms: extreme appetite   Lifestyle & Dietary Hx  Pt states she cannot seem to stop herself from compulsive eating. Pt states she recently joined a gym. Pt states she is one point over for being prediabetic. Pt states she is finally sleeping. Pt states she thinks they got her on the right medication for her bipolar and is feeling much better.     Estimated daily fluid intake:  oz Supplements:  Sleep:  Stress / self-care:  Current average weekly physical activity: ADL's  24-Hr Dietary Recall First Meal: cereal Snack: fruit Second Meal: carrots and brown rice and [place] Snack: pop tart Third Meal:  Snack:  Beverages: 64 oz water + lemon   NUTRITION INTERVENTION  Nutrition education (E-1) on the following topics:  Dietitian worked with pt on their emotional relationship with food as this is integral to the pts maintenance of a healthy lifestyle long term.  Identify Triggers: What leads one to emotionally eat? Is there a sudden onset of cravings? Is there rapid consumption?  Awareness  Explore and identify emotional triggers that lead to certain eating behaviors. These triggers could be stress, boredom, sadness, loneliness, or a plethora of other emotions.  Mindful Eating: Encouraged mindfulness during meals. This involves paying attention to the sensory experience of eating, such as the taste, texture, and smell of food. It can help increase awareness of hunger and satisfaction cues. Gave pt mindfulness exercise   Emotional Awareness: Fostered emotional awareness by helping the patient recognize and express their emotions in ways other than turning to food (moving through the  emotion rather than stuffing it down with food). Encouraged journaling, talking to a therapist, or engaging in activities that provide emotional support and assist the pt in more appropriately dealing with their feelings.  Cognitive Behavioral Techniques: Introduce cognitive-behavioral techniques to challenge and change negative thought patterns related to food. Help the patient develop healthier coping mechanisms for dealing with emotions. Establish Healthy Coping Strategies:  Assist the patient in finding alternative coping strategies for managing emotions without resorting to food. This could include exercise, meditation, deep breathing, or engaging in hobbies.  Support System: Encourage the patient to build a strong support system, whether it's through friends, family, or support groups. Having a network can provide emotional support during challenging times.  Self-Compassion: Foster self-compassion and encourage the patient to be kind to themselves. Building a positive self-image can contribute to healthier relationships with food.  Gradual Changes: Support the patient in making gradual, sustainable changes to their eating habits. Small steps over time are more likely to lead to long-term success.  Professional Counseling: Advised the patient work with a Armed forces technical officer. Professional guidance can provide additional tools and strategies for addressing emotional issues. As a dietitian I cannot offer mental health advice  Nutritional Education: Provided education on the nutritional aspects of food and help the patient understand the role of food in nourishing the body rather than solely as a way to cope with emotions.  Handouts Provided Include  Detailed MyPlate Should I eat sheet  Learning Style & Readiness for Change Teaching method utilized: Visual & Auditory  Demonstrated degree of understanding via: Teach Back  Barriers to learning/adherence to lifestyle  change: compulsive eating  Goals Established by Pt Get back to getting to the gym for 30 minutes most days a week Identify the different between appetite and hunger   MONITORING & EVALUATION Dietary intake, weekly physical activity  Next Steps  Patient is to return in January

## 2023-02-23 ENCOUNTER — Other Ambulatory Visit: Payer: Self-pay | Admitting: Nurse Practitioner

## 2023-02-23 DIAGNOSIS — Z1212 Encounter for screening for malignant neoplasm of rectum: Secondary | ICD-10-CM

## 2023-02-23 DIAGNOSIS — Z1211 Encounter for screening for malignant neoplasm of colon: Secondary | ICD-10-CM

## 2023-03-01 ENCOUNTER — Ambulatory Visit
Admission: RE | Admit: 2023-03-01 | Discharge: 2023-03-01 | Disposition: A | Discharge status: Home / Self Care | Payer: Medicaid Other | Source: Ambulatory Visit | Attending: Family Medicine | Admitting: Family Medicine

## 2023-03-01 DIAGNOSIS — Z13820 Encounter for screening for osteoporosis: Secondary | ICD-10-CM

## 2023-03-06 LAB — COLOGUARD

## 2023-03-09 ENCOUNTER — Ambulatory Visit (AMBULATORY_SURGERY_CENTER): Payer: Medicaid Other

## 2023-03-09 ENCOUNTER — Other Ambulatory Visit: Payer: Self-pay | Admitting: Nurse Practitioner

## 2023-03-09 VITALS — Ht 69.0 in

## 2023-03-09 DIAGNOSIS — Z1211 Encounter for screening for malignant neoplasm of colon: Secondary | ICD-10-CM

## 2023-03-09 MED ORDER — NA SULFATE-K SULFATE-MG SULF 17.5-3.13-1.6 GM/177ML PO SOLN
1.0000 | Freq: Once | ORAL | 0 refills | Status: AC
Start: 2023-03-09 — End: 2023-03-09

## 2023-03-09 NOTE — Progress Notes (Signed)
No egg or soy allergy known to patient  No issues known to pt with past sedation with any surgeries or procedures Patient denies ever being told they had issues or difficulty with intubation  No FH of Malignant Hyperthermia Pt is not on diet pills Pt is not on  home 02  Pt is not on blood thinners  Pt denies issues with constipation  No A fib or A flutter Have any cardiac testing pending--no  LOA: independent  Prep: Suprep   Patient's chart reviewed by Cathlyn Parsons CNRA prior to previsit and patient appropriate for the [facility].  Previsit completed and red dot placed by patient's name on their procedure day (on provider's schedule).     PV competed with patient. Prep instructions sent via mychart and home address. Goodrx coupon for [facility] provided to use for price reduction if needed.

## 2023-03-23 LAB — COLOGUARD: COLOGUARD: NEGATIVE

## 2023-04-03 ENCOUNTER — Ambulatory Visit: Payer: Medicaid Other | Admitting: Gastroenterology

## 2023-04-03 ENCOUNTER — Encounter: Payer: Self-pay | Admitting: Gastroenterology

## 2023-04-03 VITALS — BP 114/78 | HR 62 | Temp 97.3°F | Resp 13 | Ht 69.0 in | Wt 207.4 lb

## 2023-04-03 DIAGNOSIS — Z1211 Encounter for screening for malignant neoplasm of colon: Secondary | ICD-10-CM

## 2023-04-03 DIAGNOSIS — K635 Polyp of colon: Secondary | ICD-10-CM | POA: Diagnosis not present

## 2023-04-03 DIAGNOSIS — D123 Benign neoplasm of transverse colon: Secondary | ICD-10-CM | POA: Diagnosis not present

## 2023-04-03 MED ORDER — SODIUM CHLORIDE 0.9 % IV SOLN: 500.0000 mL | Freq: Once | INTRAVENOUS | Status: DC

## 2023-04-03 NOTE — Progress Notes (Signed)
To pacu VSS. Report to [email or web address] 

## 2023-04-03 NOTE — Progress Notes (Signed)
Pt's states no medical or surgical changes since previsit or office visit. 

## 2023-04-03 NOTE — Progress Notes (Signed)
[facility] History and Physical   Primary Care Physician:  Claiborne Rigg, NP   Reason for Procedure:   Colon cancer screening  Plan:    colonoscopy     HPI: Brandi Collins is a 57 y.o. female  here for colonoscopy screening -  first time exam.   Patient denies any bowel symptoms at this time. No family history of colon cancer known. Otherwise feels well without any cardiopulmonary symptoms.   I have discussed risks / benefits of anesthesia and endoscopic procedure with [name] and they wish to proceed with the exams as outlined today.    Past Medical History:  Diagnosis Date   ADHD (attention deficit hyperactivity disorder)    Bipolar disorder (HCC)    Depression    Hypertension    Lateral meniscus tear    left    Past Surgical History:  Procedure Laterality Date   KNEE ARTHROSCOPY WITH LATERAL MENISECTOMY Left 04/11/2019   Procedure: LEFT KNEE ARTHROSCOPY WITH PARTIAL LATERAL MENISECTOMY;  Surgeon: Tarry Kos, MD;  Location: [facility];  Service: Orthopedics;  Laterality: Left;   THROAT SURGERY     patient was 57 years old.     Prior to Admission medications   Medication Sig Start Date End Date Taking? Authorizing Provider  FLUoxetine (PROZAC) 20 MG capsule Take 20 mg by mouth every morning. 03/14/23  Yes [provider]  hydrochlorothiazide (HYDRODIURIL) 25 MG tablet Take 1 tablet by mouth daily. 09/07/22 09/07/23 Yes [provider]  NIFEdipine (ADALAT CC) 30 MG 24 hr tablet Take 30 mg by mouth daily.   Yes [provider]  topiramate (TOPAMAX) 50 MG tablet Take 50 mg by mouth at bedtime. 12/20/22  Yes [provider]  FLUoxetine (PROZAC) 10 MG capsule Take by mouth. 10/04/22 03/09/23  [provider]    Current Outpatient Medications  Medication Sig Dispense Refill   FLUoxetine (PROZAC) 20 MG capsule Take 20 mg by mouth every morning.     hydrochlorothiazide (HYDRODIURIL) 25 MG  tablet Take 1 tablet by mouth daily.     NIFEdipine (ADALAT CC) 30 MG 24 hr tablet Take 30 mg by mouth daily.     topiramate (TOPAMAX) 50 MG tablet Take 50 mg by mouth at bedtime.     FLUoxetine (PROZAC) 10 MG capsule Take by mouth.     Current Facility-Administered Medications  Medication Dose Route Frequency Provider Last Rate Last Admin   0.9 %  sodium chloride infusion  500 mL Intravenous Once Amanii Snethen, Willaim Rayas, MD        Allergies as of 04/03/2023   (No Known Allergies)    Family History  Problem Relation Age of Onset   Hypertension Mother    Heart disease Mother    Stroke Mother    Hypertension Daughter    Colon cancer Neg Hx    Colon polyps Neg Hx    Esophageal cancer Neg Hx    Rectal cancer Neg Hx    Stomach cancer Neg Hx     Social History   Socioeconomic History   Marital status: Divorced    Spouse name: Not on file   Number of children: Not on file   Years of education: Not on file   Highest education level: GED or equivalent  Occupational History   Not on file  Tobacco Use   Smoking status: Never   Smokeless tobacco: Never  Vaping Use   Vaping status: Never Used  Substance and  Sexual Activity   Alcohol use: No   Drug use: Not Currently    Types: Marijuana    Comment: I don't use marijuana anymore   Sexual activity: Not Currently    Birth control/protection: Post-menopausal  Other Topics Concern   Not on file  Social History Narrative   Not on file   Social Determinants of Health   Financial Resource Strain: Low Risk  (01/06/2023)   Overall Financial Resource Strain (CARDIA)    Difficulty of Paying Living Expenses: Not hard at all  Food Insecurity: Not on File (02/15/2023)   Received from [facility] Insecurity    Food: 0  Transportation Needs: No Transportation Needs (01/06/2023)   PRAPARE - Administrator, Civil Service (Medical): No    Lack of Transportation (Non-Medical): No  Physical Activity: Sufficiently Active  (01/06/2023)   Exercise Vital Sign    Days of Exercise per Week: 5 days    Minutes of Exercise per Session: 30 min  Stress: No Stress Concern Present (01/06/2023)   [employer] of Occupational Health - Occupational Stress Questionnaire    Feeling of Stress : Not at all  Social Connections: Not on File (02/01/2023)   Received from [facility]   Social Connections    Connectedness: 0  Recent Concern: Social Connections - Moderately Isolated (01/06/2023)   Social Connection and Isolation Panel [NHANES]    Frequency of Communication with Friends and Family: More than three times a week    Frequency of Social Gatherings with Friends and Family: More than three times a week    Attends Religious Services: More than 4 times per year    Active Member of [employer] or Organizations: No    Attends Engineer, structural: Not on file    Marital Status: Separated  Intimate Partner Violence: Not on file    Review of Systems: All other review of systems negative except as mentioned in the HPI.  Physical Exam: Vital signs BP 111/74   Pulse 64   Temp (!) 97.3 F (36.3 C)   Ht 5\' 9"  (1.753 m)   Wt 207 lb 6.4 oz (94.1 kg)   SpO2 95%   BMI 30.63 kg/m   General:   Alert,  Well-developed, pleasant and cooperative in NAD Lungs:  Clear throughout to auscultation.   Heart:  Regular rate and rhythm Abdomen:  Soft, nontender and nondistended.   Neuro/Psych:  Alert and cooperative. Normal mood and affect. A and O x 3  Harlin Rain, MD [facility]

## 2023-04-03 NOTE — Op Note (Signed)
[facility] Patient Name: Brandi Collins Procedure Date: 04/03/2023 11:14 AM MRN: [id number] Endoscopist: Viviann Spare P. Adela Lank , MD, [id number] Age: 57 Referring MD:  Date of Birth: [birth year] Gender: Female Account #: [id number] Procedure:                Colonoscopy Indications:              Screening for colorectal malignant neoplasm, This                            is the patient's first colonoscopy Medicines:                Monitored Anesthesia Care Procedure:                Pre-Anesthesia Assessment:                           - Prior to the procedure, a History and Physical                            was performed, and patient medications and                            allergies were reviewed. The patient's tolerance of                            previous anesthesia was also reviewed. The risks                            and benefits of the procedure and the sedation                            options and risks were discussed with the patient.                            All questions were answered, and informed consent                            was obtained. Prior Anticoagulants: The patient has                            taken no anticoagulant or antiplatelet agents. ASA                            Grade Assessment: II - A patient with mild systemic                            disease. After reviewing the risks and benefits,                            the patient was deemed in satisfactory condition to                            undergo the procedure.  After obtaining informed consent, the colonoscope                            was passed under direct vision. Throughout the                            procedure, the patient's blood pressure, pulse, and                            oxygen saturations were monitored continuously. The                            CF [id number] #[id number] was introduced through the anus                            and advanced  to the the cecum, identified by                            appendiceal orifice and ileocecal valve. The                            colonoscopy was performed without difficulty. The                            patient tolerated the procedure well. The quality                            of the bowel preparation was good. The ileocecal                            valve, appendiceal orifice, and rectum were                            photographed. Scope In: 11:25:59 AM Scope Out: 11:51:09 AM Scope Withdrawal Time: 0 hours 20 minutes 39 seconds  Total Procedure Duration: 0 hours 25 minutes 10 seconds  Findings:                 The perianal and digital rectal examinations were                            normal.                           A 15 mm polyp was found in the transverse colon.                            The polyp was sessile wrapped around the back side                            of a fold. The polyp was removed with a cold snare.                            Resection and retrieval were complete.  A 10 mm polyp was found in the transverse colon.                            The polyp was flat. The polyp was removed with a                            cold snare. Resection and retrieval were complete.                           A few small-mouthed diverticula were found in the                            sigmoid colon.                           Internal hemorrhoids were found during retroflexion.                           The exam was otherwise without abnormality. Complications:            No immediate complications. Estimated blood loss:                            Minimal. Estimated Blood Loss:     Estimated blood loss was minimal. Impression:               - One 15 mm polyp in the transverse colon, removed                            with a cold snare. Resected and retrieved.                           - One 10 mm polyp in the transverse colon, removed                             with a cold snare. Resected and retrieved.                           - Diverticulosis in the sigmoid colon.                           - Internal hemorrhoids.                           - The examination was otherwise normal. Recommendation:           - Patient has a contact number available for                            emergencies. The signs and symptoms of potential                            delayed complications were discussed with the  patient. Return to normal activities tomorrow.                            Written discharge instructions were provided to the                            patient.                           - Resume previous diet.                           - Continue present medications.                           - Await pathology results. Viviann Spare P. Kammie Scioli, MD 04/03/2023 11:55:02 AM This report has been signed electronically.

## 2023-04-03 NOTE — Progress Notes (Signed)
Called to room to assist during endoscopic procedure.  Patient ID and intended procedure confirmed with present staff. Received instructions for my participation in the procedure from the performing physician.  

## 2023-04-03 NOTE — Patient Instructions (Signed)
YOU HAD AN ENDOSCOPIC PROCEDURE TODAY AT THE [facility]:   Refer to the procedure report that was given to you for any specific questions about what was found during the examination.  If the procedure report does not answer your questions, please call your gastroenterologist to clarify.  If you requested that your care partner not be given the details of your procedure findings, then the procedure report has been included in a sealed envelope for you to review at your convenience later.  YOU SHOULD EXPECT: Some feelings of bloating in the abdomen. Passage of more gas than usual.  Walking can help get rid of the air that was put into your GI tract during the procedure and reduce the bloating. If you had a lower endoscopy (such as a colonoscopy or flexible sigmoidoscopy) you may notice spotting of blood in your stool or on the toilet paper. If you underwent a bowel prep for your procedure, you may not have a normal bowel movement for a few days.  Please Note:  You might notice some irritation and congestion in your nose or some drainage.  This is from the oxygen used during your procedure.  There is no need for concern and it should clear up in a day or so.  SYMPTOMS TO REPORT IMMEDIATELY:  Following lower endoscopy (colonoscopy or flexible sigmoidoscopy):  Excessive amounts of blood in the stool  Significant tenderness or worsening of abdominal pains  Swelling of the abdomen that is new, acute  Fever of 100F or higher   For urgent or emergent issues, a gastroenterologist can be reached at any hour by calling [phone]. Do not use MyChart messaging for urgent concerns.    DIET:  We do recommend a small meal at first, but then you may proceed to your regular diet.  Drink plenty of fluids but you should avoid alcoholic beverages for 24 hours.  MEDICATIONS: Continue present medications.  FOLLOW UP: Await pathology results.  Please see handouts given to you by your recovery  nurse: Polyps, Diverticulosis, Hemorrhoids.  Thank you for allowing us to provide for your healthcare needs today.  ACTIVITY:  You should plan to take it easy for the rest of today and you should NOT DRIVE or use heavy machinery until tomorrow (because of the sedation medicines used during the test).    FOLLOW UP: Our staff will call the number listed on your records the next business day following your procedure.  We will call around 7:15- 8:00 am to check on you and address any questions or concerns that you may have regarding the information given to you following your procedure. If we do not reach you, we will leave a message.     If any biopsies were taken you will be contacted by phone or by letter within the next 1-3 weeks.  Please call us at [phone] if you have not heard about the biopsies in 3 weeks.    SIGNATURES/CONFIDENTIALITY: You and/or your care partner have signed paperwork which will be entered into your electronic medical record.  These signatures attest to the fact that that the information above on your After Visit Summary has been reviewed and is understood.  Full responsibility of the confidentiality of this discharge information lies with you and/or your care-partner. 

## 2023-04-04 ENCOUNTER — Telehealth: Payer: Self-pay

## 2023-04-04 NOTE — Telephone Encounter (Signed)
  Follow up Call-     04/03/2023   11:06 AM  Call back number  Post procedure Call Back phone  # [phone]  Permission to leave phone message Yes     Patient questions:  Do you have a fever, pain , or abdominal swelling? No. Pain Score  0 *  Have you tolerated food without any problems? Yes.    Have you been able to return to your normal activities? Yes.    Do you have any questions about your discharge instructions: Diet   No. Medications  No. Follow up visit  No.  Do you have questions or concerns about your Care? No.  Actions: * If pain score is 4 or above: No action needed, pain <4.

## 2023-04-05 LAB — SURGICAL PATHOLOGY

## 2023-04-09 ENCOUNTER — Ambulatory Visit: Payer: Self-pay

## 2023-04-09 NOTE — Telephone Encounter (Signed)
Will see her on 11-22. If she develops chest pain she needs to go to the hospital or if palpitations are continuous she needs to go the hospital.

## 2023-04-09 NOTE — Telephone Encounter (Signed)
     Chief Complaint: "Hear flutters that lasts 1 minute." Has appointment 04/13/23, but asking to be seen sooner. Symptoms: Above Frequency: 2 days ago Pertinent Negatives: Patient denies chest pain or SOB. Disposition: [] ED /[] Urgent Care (no appt availability in office) / [] Appointment(In office/virtual)/ []  [place] [facility]/ [] Home Care/ [] Refused Recommended Disposition /[] [facility]/ [x]  Follow-up with PCP Additional Notes: Please advise pt.  Reason for Disposition  [1] Palpitations AND [2] no improvement after using Care Advice  Answer Assessment - Initial Assessment Questions 1. DESCRIPTION: "Please describe your heart rate or heartbeat that you are having" (e.g., fast/slow, regular/irregular, skipped or extra beats, "palpitations")     Flutters 2. ONSET: "When did it start?" (Minutes, hours or days)      2 days ago 3. DURATION: "How long does it last" (e.g., seconds, minutes, hours)     1 minute 4. PATTERN "Does it come and go, or has it been constant since it started?"  "Does it get worse with exertion?"   "Are you feeling it now?"     Comes and goes 5. TAP: "Using your hand, can you tap out what you are feeling on a chair or table in front of you, so that I can hear?" (Note: not all patients can do this)       No 6. HEART RATE: "Can you tell me your heart rate?" "How many beats in 15 seconds?"  (Note: not all patients can do this)       82 7. RECURRENT SYMPTOM: "Have you ever had this before?" If Yes, ask: "When was the last time?" and "What happened that time?"      No 8. CAUSE: "What do you think is causing the palpitations?"     Unsure 9. CARDIAC HISTORY: "Do you have any history of heart disease?" (e.g., heart attack, angina, bypass surgery, angioplasty, arrhythmia)      Murmur 10. OTHER SYMPTOMS: "Do you have any other symptoms?" (e.g., dizziness, chest pain, sweating, difficulty breathing)       No 11. PREGNANCY: "Is there any chance you are  pregnant?" "When was your last menstrual period?"       No  Protocols used: Heart Rate and Heartbeat Questions-A-AH

## 2023-04-10 ENCOUNTER — Encounter: Payer: Self-pay | Admitting: Gastroenterology

## 2023-04-10 NOTE — Telephone Encounter (Signed)
Patient identified by name and date of birth.   Patient aware of response and voiced understanding.   

## 2023-04-12 ENCOUNTER — Encounter (HOSPITAL_COMMUNITY): Payer: Self-pay

## 2023-04-12 ENCOUNTER — Ambulatory Visit (HOSPITAL_COMMUNITY): Payer: Medicaid Other

## 2023-04-12 ENCOUNTER — Other Ambulatory Visit: Payer: Self-pay

## 2023-04-12 ENCOUNTER — Ambulatory Visit (HOSPITAL_COMMUNITY)
Admission: EM | Admit: 2023-04-12 | Discharge: 2023-04-12 | Disposition: A | Discharge status: Home / Self Care | Payer: Medicaid Other | Attending: Emergency Medicine | Admitting: Emergency Medicine

## 2023-04-12 DIAGNOSIS — R0789 Other chest pain: Secondary | ICD-10-CM

## 2023-04-12 DIAGNOSIS — R0602 Shortness of breath: Secondary | ICD-10-CM

## 2023-04-12 DIAGNOSIS — F418 Other specified anxiety disorders: Secondary | ICD-10-CM

## 2023-04-12 NOTE — ED Provider Notes (Signed)
[facility]    CSN: [id number] Arrival date & time: 04/12/23  1902    HISTORY   Chief Complaint  Patient presents with   Shortness of Breath   Chest Pain   HPI Brandi Collins is a pleasant, 57 y.o. female who presents to urgent care today. Patient complains of shortness of breath and chest pain that she describes as a "heaviness" which is worse with activity.  States she has been feeling poorly since April 03, 2023 when she had a colonoscopy.  Patient states she has been taking Gas-X which helps her burp which gives some relief.  Patient reports a family history of congestive heart failure and atrial fibrillation.  Reports increased stress, states she is currently trying to close on a house.   The history is provided by the patient.   Past Medical History:  Diagnosis Date   ADHD (attention deficit hyperactivity disorder)    Bipolar disorder (HCC)    Depression    Hypertension    Lateral meniscus tear    left   Patient Active Problem List   Diagnosis Date Noted   Bipolar disorder, mixed (HCC) 08/22/2022   Synovitis of knee    Acute lateral meniscus tear of left knee 04/08/2019   Pain of cervical spine 03/04/2018   Screening examination for pulmonary tuberculosis 05/05/2016   Acute bilateral low back pain without sciatica 03/27/2016   Health care maintenance 04/22/2015   Essential hypertension, benign 04/22/2015   Annual physical exam 04/28/2013   Pap smear for cervical cancer screening 04/28/2013   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    OB History   No obstetric history on file.    Home Medications    Prior to Admission medications   Medication Sig Start Date End Date Taking? Authorizing Provider  FLUoxetine (PROZAC) 20 MG capsule Take 20 mg by mouth every morning. 03/14/23  Yes [provider]  hydrochlorothiazide (HYDRODIURIL) 25 MG tablet Take 1 tablet by mouth daily. 09/07/22 09/07/23 Yes [provider]  NIFEdipine (ADALAT CC) 30 MG 24 hr tablet Take 30 mg by mouth daily.   Yes [provider]  topiramate (TOPAMAX) 50 MG tablet Take 50 mg by mouth at bedtime. 12/20/22  Yes [provider]  FLUoxetine (PROZAC) 10 MG capsule Take by mouth. 10/04/22 03/09/23  [provider]    Family History Family History  Problem Relation Age of Onset   Hypertension Mother    Heart disease Mother    Stroke Mother    Hypertension Daughter    Colon cancer Neg Hx    Colon polyps Neg Hx    Esophageal cancer Neg Hx    Rectal cancer Neg Hx    Stomach cancer Neg Hx    Social History Social History   Tobacco Use   Smoking status: Never   Smokeless tobacco: Never  Vaping Use   Vaping status: Never Used  Substance Use Topics   Alcohol use: No   Drug use: Not Currently    Types: Marijuana    Comment: I don't  use marijuana anymore   Allergies   Patient has no known allergies.  Review of Systems Review of Systems Pertinent findings revealed after performing a 14 point review of systems has been noted in the history of present illness.  Physical Exam Vital Signs BP 126/87 (BP Location: Left Arm)   Pulse 69   Temp 97.7 F (36.5 C) (Oral)   Resp 18   Ht 5\' 9"  (1.753 m)   Wt 219 lb (99.3 kg)   SpO2 95%   BMI 32.34 kg/m   No data found.  Physical Exam Vitals and nursing note reviewed.  Constitutional:      General: She is not in acute distress.    Appearance: Normal appearance. She is not ill-appearing.  HENT:     Head: Normocephalic and atraumatic.      Salivary Glands: Right salivary gland is not diffusely enlarged or tender. Left salivary gland is not diffusely enlarged or tender.     Right Ear: Tympanic membrane, ear canal and external ear normal. No drainage. No middle ear effusion. There is no impacted cerumen. Tympanic membrane is not erythematous or bulging.     Left Ear: Tympanic membrane, ear canal and external ear normal. No drainage.  No middle ear effusion. There is no impacted cerumen. Tympanic membrane is not erythematous or bulging.     Nose: Nose normal. No nasal deformity, septal deviation, mucosal edema, congestion or rhinorrhea.     Right Turbinates: Not enlarged, swollen or pale.     Left Turbinates: Not enlarged, swollen or pale.     Right Sinus: No maxillary sinus tenderness or frontal sinus tenderness.     Left Sinus: No maxillary sinus tenderness or frontal sinus tenderness.     Mouth/Throat:     Lips: Pink. No lesions.     Mouth: Mucous membranes are moist. No oral lesions.     Pharynx: Oropharynx is clear. Uvula midline. No posterior oropharyngeal erythema or uvula swelling.     Tonsils: No tonsillar exudate. 0 on the right. 0 on the left.  Eyes:     General: Lids are normal.        Right eye: No discharge.        Left eye: No discharge.     Extraocular Movements: Extraocular movements intact.     Conjunctiva/sclera: Conjunctivae normal.     Right eye: Right conjunctiva is not injected.     Left eye: Left conjunctiva is not injected.  Neck:     Trachea: Trachea and phonation normal.  Cardiovascular:     Rate and Rhythm: Normal rate and regular rhythm.     Pulses: Normal pulses.     Heart sounds: Normal heart sounds, S1 normal and S2 normal. No murmur heard.    No friction rub. No gallop.  Pulmonary:     Effort: Pulmonary effort is normal. No tachypnea, bradypnea, accessory muscle usage, prolonged expiration, respiratory distress or retractions.     Breath sounds: Normal breath sounds and air entry. No  stridor, decreased air movement or transmitted upper airway sounds. No decreased breath sounds, wheezing, rhonchi or rales.  Chest:     Chest wall: No tenderness.  Musculoskeletal:        General: Normal range of motion.     Cervical back: Normal range of motion and neck supple. Normal range of motion.  Lymphadenopathy:     Cervical: No cervical adenopathy.  Skin:    General: Skin is warm and dry.     Findings:  No erythema or rash.  Neurological:     General: No focal deficit present.     Mental Status: She is alert and oriented to person, place, and time.  Psychiatric:        Mood and Affect: Mood normal.        Behavior: Behavior normal.     Visual Acuity Right Eye Distance:   Left Eye Distance:   Bilateral Distance:    Right Eye Near:   Left Eye Near:    Bilateral Near:     UC Couse / Diagnostics / Procedures:     Radiology No results found.  Procedures Procedures (including critical care time) EKG  Pending results:  Labs Reviewed - No data to display  Medications Ordered in UC: Medications - No data to display  UC Diagnoses / Final Clinical Impressions(s)   I have reviewed the triage vital signs and the nursing notes.  Pertinent labs & imaging results that were available during my care of the patient were reviewed by me and considered in my medical decision making (see chart for details).    Final diagnoses:  Situational anxiety   Dependent read of chest x-ray reveals no concern for pneumonia, atelectasis, COPD.  EKG unchanged compared to previous 1 year ago.  Patient reassured both.  Patient reports increased anxiety at this time, states she has an appointment with her primary care provider tomorrow.  Patient advised I will contact her with the results of her chest x-ray as well should the radiologist see anything different.  Please see discharge instructions below for details of plan of care as provided to patient. ED Prescriptions   None    PDMP not  reviewed this encounter.  Pending results:  Labs Reviewed - No data to display  Discharge Instructions: Discharge Instructions   None     Disposition Upon Discharge:  Condition: stable for discharge home  Patient presented with an acute illness with associated systemic symptoms and significant discomfort requiring urgent management. In my opinion, this is a condition that a prudent lay person (someone who possesses an average knowledge of health and medicine) may potentially expect to result in complications if not addressed urgently such as respiratory distress, impairment of bodily function or dysfunction of bodily organs.   Routine symptom specific, illness specific and/or disease specific instructions were discussed with the patient and/or caregiver at length.   As such, the patient has been evaluated and assessed, work-up was performed and treatment was provided in alignment with urgent care protocols and evidence based medicine.  Patient/parent/caregiver has been advised that the patient may require follow up for further testing and treatment if the symptoms continue in spite of treatment, as clinically indicated and appropriate.  Patient/parent/caregiver has been advised to return to the [facility] or PCP if no better; to PCP or the Emergency Department if new signs and symptoms develop, or if the current signs or symptoms continue to change or worsen for further workup, evaluation and treatment as clinically indicated and appropriate  The patient will follow up with their current PCP if and as advised. If the patient does not currently have a PCP we will assist them in obtaining one.   The patient may need specialty follow up if the symptoms continue, in spite of conservative treatment and management, for further workup, evaluation, consultation and treatment as clinically indicated and appropriate.  Patient/parent/caregiver verbalized understanding and agreement of plan as discussed.  All  questions were addressed during visit.  Please see discharge  instructions below for further details of plan.  This office note has been dictated using Teaching laboratory technician.  Unfortunately, this method of dictation can sometimes lead to typographical or grammatical errors.  I apologize for your inconvenience in advance if this occurs.  Please do not hesitate to reach out to me if clarification is needed.      Theadora Rama Scales, [place] 04/12/23 2024

## 2023-04-12 NOTE — ED Triage Notes (Signed)
Patient had a colonoscopy on 04/03/23 and not feeling well since. Having SOB and chest pain. Took some gas x and has had little relief with burping. Has family history of CHF and Afib.   States this is worse with activity.

## 2023-04-13 ENCOUNTER — Ambulatory Visit: Payer: Medicaid Other | Admitting: Nurse Practitioner

## 2023-04-16 ENCOUNTER — Ambulatory Visit: Payer: Medicaid Other | Admitting: Physician Assistant

## 2023-05-01 ENCOUNTER — Ambulatory Visit: Payer: Medicaid Other | Admitting: Nurse Practitioner

## 2023-05-24 ENCOUNTER — Other Ambulatory Visit: Payer: Self-pay | Admitting: Nurse Practitioner

## 2023-05-24 DIAGNOSIS — Z1231 Encounter for screening mammogram for malignant neoplasm of breast: Secondary | ICD-10-CM

## 2023-05-28 DIAGNOSIS — Z1231 Encounter for screening mammogram for malignant neoplasm of breast: Secondary | ICD-10-CM

## 2023-05-29 ENCOUNTER — Ambulatory Visit: Payer: Medicaid Other | Admitting: Skilled Nursing Facility1

## 2023-06-04 ENCOUNTER — Ambulatory Visit (INDEPENDENT_AMBULATORY_CARE_PROVIDER_SITE_OTHER): Payer: 59

## 2023-06-04 ENCOUNTER — Encounter (HOSPITAL_COMMUNITY): Payer: Self-pay

## 2023-06-04 ENCOUNTER — Ambulatory Visit (HOSPITAL_COMMUNITY)
Admission: RE | Admit: 2023-06-04 | Discharge: 2023-06-04 | Disposition: A | Discharge status: Home / Self Care | Payer: 59 | Source: Ambulatory Visit | Attending: Family Medicine | Admitting: Family Medicine

## 2023-06-04 VITALS — BP 127/80 | HR 71 | Temp 98.0°F | Resp 18

## 2023-06-04 DIAGNOSIS — S92334A Nondisplaced fracture of third metatarsal bone, right foot, initial encounter for closed fracture: Secondary | ICD-10-CM

## 2023-06-04 MED ORDER — ACETAMINOPHEN 325 MG PO TABS
650.0000 mg | ORAL_TABLET | Freq: Once | ORAL | Status: AC
  Administered 2023-06-04: 650 mg via ORAL

## 2023-06-04 MED ORDER — ACETAMINOPHEN 325 MG PO TABS
ORAL_TABLET | ORAL | Status: AC
  Filled 2023-06-04: qty 2

## 2023-06-04 NOTE — ED Notes (Signed)
 Discharge and CAM walker application pending repeat xray

## 2023-06-04 NOTE — Discharge Instructions (Addendum)
 Please continue to wear your boot at all times for the next 3 weeks.  Please follow-up in the sports medicine clinic in the next 3 to 4 weeks.  You may take Tylenol  as needed for the pain but I would avoid ibuprofen  right now given your recent kidney function testing.  If you feel the pain is not getting better or not improving at all please make an appointment to see the sports medicine clinic sooner

## 2023-06-04 NOTE — ED Provider Notes (Addendum)
 [facility]    CSN: [id number] Arrival date & time: 06/04/23  0844      History   Chief Complaint Chief Complaint  Patient presents with   Toe Injury    HPI Brandi Collins is a 58 y.o. female.   Patient is presenting after hitting her right fourth and fifth toe against a wall last night.  Patient states that she worked third shift and was walking a lot and has a history of fracturing her right pinky toe.  Patient has some swelling as well as pain.  No other concerns at this time.  Patient did have a boot but states that she is not sure where it is anymore.     Past Medical History:  Diagnosis Date   ADHD (attention deficit hyperactivity disorder)    Bipolar disorder (HCC)    Depression    Hypertension    Lateral meniscus tear    left    Patient Active Problem List   Diagnosis Date Noted   Bipolar disorder, mixed (HCC) 08/22/2022   Synovitis of knee    Acute lateral meniscus tear of left knee 04/08/2019   Pain of cervical spine 03/04/2018   Screening examination for pulmonary tuberculosis 05/05/2016   Acute bilateral low back pain without sciatica 03/27/2016   Health care maintenance 04/22/2015   Essential hypertension, benign 04/22/2015   Annual physical exam 04/28/2013   Pap smear for cervical cancer screening 04/28/2013   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     OB History   No obstetric history on file.      Home Medications    Prior to Admission medications   Medication Sig Start Date End Date Taking?  Authorizing Provider  FLUoxetine (PROZAC) 10 MG capsule Take by mouth. 10/04/22 03/09/23  [provider]  FLUoxetine (PROZAC) 20 MG capsule Take 20 mg by mouth every morning. 03/14/23   [provider]  hydrochlorothiazide  (HYDRODIURIL ) 25 MG tablet Take 1 tablet by mouth daily. 09/07/22 09/07/23  [provider]  NIFEdipine  (ADALAT  CC) 30 MG 24 hr tablet Take 30 mg by mouth daily.    [provider]  topiramate (TOPAMAX) 50 MG tablet Take 50 mg by mouth at bedtime. 12/20/22   [provider]    Family History Family History  Problem Relation Age of Onset   Hypertension Mother    Heart disease Mother    Stroke Mother    Hypertension Daughter    Colon cancer Neg Hx    Colon polyps Neg Hx    Esophageal cancer Neg Hx    Rectal cancer Neg Hx    Stomach cancer Neg Hx     Social History Social History   Tobacco Use   Smoking status: Never   Smokeless tobacco: Never  Vaping Use   Vaping status: Never Used  Substance Use Topics   Alcohol use: No   Drug use: Not Currently    Types: Marijuana    Comment: I don't use marijuana anymore     Allergies   Patient has no  known allergies.   Review of Systems Review of Systems   Physical Exam Triage Vital Signs ED Triage Vitals  Encounter Vitals Group     BP 06/04/23 0923 127/80     Systolic BP Percentile --      Diastolic BP Percentile --      Pulse Rate 06/04/23 0923 71     Resp 06/04/23 0923 18     Temp 06/04/23 0923 98 F (36.7 C)     Temp Source 06/04/23 0923 Oral     SpO2 06/04/23 0923 93 %     Weight --      Height --      Head Circumference --      Peak Flow --      Pain Score 06/04/23 0924 8     Pain Loc --      Pain Education --      Exclude from Growth Chart --    No data found.  Updated Vital Signs BP 127/80 (BP Location: Left Arm)   Pulse 71   Temp 98 F (36.7 C) (Oral)   Resp 18   SpO2 93%   Visual Acuity Right Eye Distance:   Left Eye Distance:    Bilateral Distance:    Right Eye Near:   Left Eye Near:    Bilateral Near:     Physical Exam  Right foot: There is some swelling of the right fourth and fifth digit which does extend to the dorsal side of the Midfoot.  There is no tenderness at the midfoot.  There is no tenderness to palpation over the base of the fifth.  No obvious signs of any angulation.  Range of motion is appropriate though pain noted  UC Treatments / Results  Labs (all labs ordered are listed, but only abnormal results are displayed) Labs Reviewed - No data to display  EKG   Radiology DG Toe 4th Right Result Date: 06/04/2023 CLINICAL DATA:  Right fourth and fifth toe swelling and pain after hitting them on the wall yesterday. EXAM: RIGHT FOURTH TOE; RIGHT FIFTH TOE COMPARISON:  None Available. FINDINGS: No acute fracture or dislocation. Old healed fifth proximal phalanx fracture. Joint spaces are preserved. Bone mineralization is normal. Soft tissues are unremarkable. IMPRESSION: 1. No acute osseous abnormality. Electronically Signed   By: Elsie ONEIDA Shoulder M.D.   On: 06/04/2023 10:08   DG Toe 5th Right Result Date: 06/04/2023 CLINICAL DATA:  Right fourth and fifth toe swelling and pain after hitting them on the wall yesterday. EXAM: RIGHT FOURTH TOE; RIGHT FIFTH TOE COMPARISON:  None Available. FINDINGS: No acute fracture or dislocation. Old healed fifth proximal phalanx fracture. Joint spaces are preserved. Bone mineralization is normal. Soft tissues are unremarkable. IMPRESSION: 1. No acute osseous abnormality. Electronically Signed   By: Elsie ONEIDA Shoulder M.D.   On: 06/04/2023 10:08    Procedures Procedures (including critical care time)  Medications Ordered in UC Medications  acetaminophen  (TYLENOL ) tablet 650 mg (has no administration in time range)    Initial Impression / Assessment and Plan / UC Course  I have reviewed the triage vital signs and the nursing notes.  Pertinent labs & imaging results that  were available during my care of the patient were reviewed by me and considered in my medical decision making (see chart for details).     X-rays of the fourth and fifth digit show no signs of any fracture however there is a possible fracture of the third metatarsal base.  Does  not appear to be angulated or displaced.  Will go ahead and place patient in a cam boot now for the next 4 weeks and have her follow-up with sports medicine clinic in 4 weeks.  Patient does have some mild kidney issues so we will avoid any anti-inflammatories and do Tylenol  for now.  Patient is understanding agreeable plan. Final Clinical Impressions(s) / UC Diagnoses   Final diagnoses:  Closed nondisplaced fracture of third metatarsal bone of right foot, initial encounter     Discharge Instructions      Please continue to wear your boot at all times for the next 3 weeks.  Please follow-up in the sports medicine clinic in the next 3 to 4 weeks.  You may take Tylenol  as needed for the pain but I would avoid ibuprofen  right now given your recent kidney function testing.  If you feel the pain is not getting better or not improving at all please make an appointment to see the sports medicine clinic sooner     ED Prescriptions   None    PDMP not reviewed this encounter.   Vita Morrow, MD 06/04/23 1056    Vita Morrow, MD 06/04/23 1110

## 2023-06-04 NOTE — ED Triage Notes (Signed)
 Pt states hit her rt last two toes on the wall yesterday around 6pm. C/o swelling and pain. Denies taking any meds for pain.

## 2023-06-14 ENCOUNTER — Encounter: Payer: Self-pay | Admitting: Physician Assistant

## 2023-06-14 ENCOUNTER — Ambulatory Visit: Payer: 59 | Attending: Physician Assistant | Admitting: Physician Assistant

## 2023-06-14 VITALS — BP 138/90 | HR 88 | Wt 208.6 lb

## 2023-06-14 DIAGNOSIS — R7989 Other specified abnormal findings of blood chemistry: Secondary | ICD-10-CM | POA: Diagnosis not present

## 2023-06-14 DIAGNOSIS — R7303 Prediabetes: Secondary | ICD-10-CM

## 2023-06-14 NOTE — Patient Instructions (Signed)
Work at a goal of eliminating sugary drinks, candy, desserts, sweets, refined sugars, processed foods, and white carbohydrates.  Drink 80 ounces water daily

## 2023-06-14 NOTE — Progress Notes (Signed)
Patient ID: Brandi Collins, female   DOB: 05-03-1966, 58 y.o.   MRN: [id number]   Brandi Collins, is a 58 y.o. female  KVQ:[id number]  IEP:[id number]  DOB - 06-24-1965  Chief Complaint  Patient presents with   Medical Management of Chronic Issues       Subjective:   Brandi Collins is a 58 y.o. female here today bringing some labs that were done at her work this month 05/2023.   Total cholesterol =173 HDL=69 LDL=101 TSH normal.   Serum Cr=1.39 A1C=5.8 She wanted to review the results together.  She has been working really hard to change her diet over this year.  She is trying to drink 64 ounces water daily.   Due for 2nd shingles shot but has 12 hour shift tonight.     No problems updated.  ALLERGIES: No Known Allergies  PAST MEDICAL HISTORY: Past Medical History:  Diagnosis Date   ADHD (attention deficit hyperactivity disorder)    Bipolar disorder (HCC)    Depression    Hypertension    Lateral meniscus tear    left    MEDICATIONS AT HOME: Prior to Admission medications   Medication Sig Start Date End Date Taking? Authorizing Provider  FLUoxetine (PROZAC) 20 MG capsule Take 20 mg by mouth every morning. 03/14/23  Yes [provider]  hydrochlorothiazide (HYDRODIURIL) 25 MG tablet Take 1 tablet by mouth daily. 09/07/22 09/07/23 Yes [provider]  NIFEdipine (ADALAT CC) 30 MG 24 hr tablet Take 30 mg by mouth daily.   Yes [provider]  topiramate (TOPAMAX) 50 MG tablet Take 50 mg by mouth at bedtime. 12/20/22  Yes [provider]  FLUoxetine (PROZAC) 10 MG capsule Take by mouth. 10/04/22 03/09/23  [provider]    ROS: Neg HEENT Neg resp Neg cardiac Neg GI Neg GU Neg MS Neg psych Neg neuro  Objective:   Vitals:   06/14/23 0920  BP: (!) 138/90  Pulse: 88  SpO2: 98%  Weight: 208 lb 9.6 oz (94.6 kg)   Exam General appearance : Awake, alert, not in any distress. Speech Clear. Not toxic looking Neurology:  Awake alert, and oriented X 3, CN II-XII intact, Non focal Skin: No Rash  Data Review Lab Results  Component Value Date   HGBA1C 5.7 (H) 01/10/2023    Assessment & Plan   1. Prediabetes (Primary) Work at a goal of eliminating sugary drinks, candy, desserts, sweets, refined sugars, processed foods, and white carbohydrates.   2. Elevated serum creatinine Continue with adequate water intake.(64 to 80 ounces)     Return tomorrow for shingles #2 tomorrow  Return in about 4 months (around 10/12/2023) for PCP for chronic conditions.  The patient was given clear instructions to go to ER or return to medical center if symptoms don't improve, worsen or new problems develop. The patient verbalized understanding. The patient was told to call to get lab results if they haven't heard anything in the next week.      Georgian Co, PA-C [facility] [place], [place] [phone]   06/14/2023, 10:26 AM

## 2023-06-15 ENCOUNTER — Ambulatory Visit: Payer: Medicaid Other

## 2023-06-15 ENCOUNTER — Ambulatory Visit: Payer: 59

## 2023-06-28 ENCOUNTER — Ambulatory Visit: Payer: 59

## 2023-07-17 ENCOUNTER — Ambulatory Visit: Payer: Self-pay | Admitting: Nurse Practitioner

## 2023-07-17 ENCOUNTER — Telehealth: Payer: Self-pay | Admitting: Nurse Practitioner

## 2023-07-17 ENCOUNTER — Other Ambulatory Visit: Payer: Self-pay | Admitting: Nurse Practitioner

## 2023-07-17 NOTE — Telephone Encounter (Unsigned)
 Copied from CRM [id number]. Topic: Clinical - Medical Advice >> Jul 17, 2023 11:24 AM Turkey B wrote: Reason for CRM: pt called in, has wax build up and sore ear, can't hear well in it

## 2023-07-17 NOTE — Telephone Encounter (Signed)
 Last Fill: Unknown  Last OV: 06/14/23 Next OV: 10/12/23   Routing to provider for review/authorization.

## 2023-07-17 NOTE — Telephone Encounter (Unsigned)
 Copied from CRM [id number]. Topic: Clinical - Medication Refill >> Jul 17, 2023 11:14 AM Benetta Spar B wrote: Most Recent Primary Care Visit:  Provider: Anders Simmonds  Department: [facility]  Visit Type: OFFICE VISIT  Date: 06/14/2023  Medication: NIFEdipine (ADALAT CC) 30 MG 24 hr tablet  Has the patient contacted their pharmacy? yes ((Agent: If yes, when and what did the pharmacy advise?)contact pcp  Is this the correct pharmacy for this prescription? yes This is the patient's preferred pharmacy:  [id number] [place], [place] - [address] AT [facility] OF [address] [place] [address] Phone: [phone] Fax: [phone]   Has the prescription been filled recently? no  Is the patient out of the medication? yes  Has the patient been seen for an appointment in the last year OR does the patient have an upcoming appointment? yes  Can we respond through MyChart? yes  Agent: Please be advised that Rx refills may take up to 3 business days. We ask that you follow-up with your pharmacy.

## 2023-07-17 NOTE — Telephone Encounter (Signed)
 Called and left voicemail for patient to call back. 3rd attempt.  Reason for Disposition . Third attempt to contact caller AND no contact made. Phone number verified.  Protocols used: No Contact or Duplicate Contact Call-A-AH

## 2023-07-17 NOTE — Telephone Encounter (Signed)
 Patient on nurse schedule provider is aware.

## 2023-07-18 ENCOUNTER — Ambulatory Visit: Payer: 59 | Attending: Family Medicine

## 2023-07-19 ENCOUNTER — Telehealth: Payer: Self-pay

## 2023-07-19 ENCOUNTER — Ambulatory Visit: Payer: 59

## 2023-07-19 NOTE — Telephone Encounter (Signed)
 Patient called in with E2C2 wanting refills on NIFEdipine (ADALAT CC) 30 MG 24 hr tablet

## 2023-07-20 ENCOUNTER — Other Ambulatory Visit: Payer: Self-pay | Admitting: Nurse Practitioner

## 2023-07-20 DIAGNOSIS — I10 Essential (primary) hypertension: Secondary | ICD-10-CM

## 2023-07-20 MED ORDER — NIFEDIPINE ER 30 MG PO TB24: 30.0000 mg | ORAL_TABLET | Freq: Every day | ORAL | 0 refills | Status: DC

## 2023-07-20 NOTE — Telephone Encounter (Signed)
 Medication sent.

## 2023-07-20 NOTE — Telephone Encounter (Signed)
 Patient identified by name and date of birth.  Patient aware of medication being sent to pharmacy.

## 2023-07-24 ENCOUNTER — Other Ambulatory Visit: Payer: Self-pay | Admitting: Nurse Practitioner

## 2023-07-24 DIAGNOSIS — I10 Essential (primary) hypertension: Secondary | ICD-10-CM

## 2023-07-24 NOTE — Telephone Encounter (Signed)
 Copied from CRM [id number]. Topic: Clinical - Medication Refill >> Jul 24, 2023  8:30 AM Everette Rank wrote: Most Recent Primary Care Visit:  Provider: [facility] N[facility]  Department: [facility]  Visit Type: NURSE VISIT  Date: 07/18/2023  Medication: NIFEdipine (ADALAT CC)  Has the patient contacted their pharmacy? Yes (Agent: If no, request that the patient contact the pharmacy for the refill. If patient does not wish to contact the pharmacy document the reason why and proceed with request.) (Agent: If yes, when and what did the pharmacy advise?)  Is this the correct pharmacy for this prescription? Yes If no, delete pharmacy and type the correct one.  This is the patient's preferred pharmacy:  [id number] [place], [place] - [address] AT [facility] [address] [place] [address] Phone: [phone] Fax: [phone]   Has the prescription been filled recently? No  Is the patient out of the medication? Yes  Has the patient been seen for an appointment in the last year OR does the patient have an upcoming appointment? Yes  Can we respond through MyChart? Yes  Agent: Please be advised that Rx refills may take up to 3 business days. We ask that you follow-up with your pharmacy.

## 2023-08-02 ENCOUNTER — Encounter: Payer: Self-pay | Admitting: Physician Assistant

## 2023-08-02 ENCOUNTER — Ambulatory Visit: Payer: 59 | Attending: Physician Assistant | Admitting: Physician Assistant

## 2023-08-02 VITALS — BP 128/83 | HR 65 | Temp 98.1°F | Ht 69.0 in | Wt 208.4 lb

## 2023-08-02 DIAGNOSIS — E669 Obesity, unspecified: Secondary | ICD-10-CM | POA: Diagnosis not present

## 2023-08-02 DIAGNOSIS — G4726 Circadian rhythm sleep disorder, shift work type: Secondary | ICD-10-CM

## 2023-08-02 DIAGNOSIS — R7303 Prediabetes: Secondary | ICD-10-CM | POA: Diagnosis not present

## 2023-08-02 DIAGNOSIS — I10 Essential (primary) hypertension: Secondary | ICD-10-CM | POA: Diagnosis not present

## 2023-08-02 DIAGNOSIS — Z23 Encounter for immunization: Secondary | ICD-10-CM | POA: Diagnosis not present

## 2023-08-02 MED ORDER — HYDROCHLOROTHIAZIDE 25 MG PO TABS: 25.0000 mg | ORAL_TABLET | Freq: Every day | ORAL | 1 refills | Status: DC

## 2023-08-02 MED ORDER — SEMAGLUTIDE-WEIGHT MANAGEMENT 0.25 MG/0.5ML ~~LOC~~ SOAJ: 0.2500 mg | SUBCUTANEOUS | 0 refills | Status: DC

## 2023-08-02 MED ORDER — NIFEDIPINE ER 30 MG PO TB24: 30.0000 mg | ORAL_TABLET | Freq: Every day | ORAL | 0 refills | Status: DC

## 2023-08-02 NOTE — Addendum Note (Signed)
 Addended by: Elsie Lincoln F on: 08/02/2023 03:15 PM   Modules accepted: Orders

## 2023-08-02 NOTE — Telephone Encounter (Unsigned)
 Copied from CRM [id number]. Topic: Clinical - Prescription Issue >> Aug 02, 2023 10:00 AM Gildardo Pounds wrote: Reason for CRM: Prior Authorization for Semaglutide-Weight Management 0.25 MG/0.5ML SOAJ. Physician needs to call the insurance company because something was left off of the prescription. Callback number is [phone]

## 2023-08-02 NOTE — Progress Notes (Signed)
 Patient ID: Brandi Collins, female   DOB: 31-Oct-1965, 58 y.o.   MRN: [id number]   Brandi Collins, is a 58 y.o. female  WUJ:[id number]  [id number]  DOB - [birth year]  Chief Complaint  Patient presents with   Weight Loss       Subjective:   Brandi Collins is a 58 y.o. female here today for Rx for prediabetes.  She would like to start wegovey.  She [facility](not in our system-she does have the results on her phone which are as follows: January 2025 at [facility] Bun=29 Cr=1.39 Chol=170 HDL 69 Ldl=101 Tgl=65 Tsh=2.26 A1C=5.8  She has increased her water intake since then and has been working out but A1C went from 5.7 to 5.8 and she is not losing weight.  She checked with her insurance and they will cover wegovy.  She wishes to take this to help her.    No new issues or concerns.  Recently buspar was added to psych meds.  She denies SI/HI and is followed by Psych.  She feels stable and good on current regimen.    No problems updated.  ALLERGIES: No Known Allergies  PAST MEDICAL HISTORY: Past Medical History:  Diagnosis Date   ADHD (attention deficit hyperactivity disorder)    Bipolar disorder (HCC)    Depression    Hypertension    Lateral meniscus tear    left    MEDICATIONS AT HOME: Prior to Admission medications   Medication Sig Start Date End Date Taking? Authorizing Provider  Semaglutide-Weight Management 0.25 MG/0.5ML SOAJ Inject 0.25 mg into the skin once a week. 08/02/23  Yes Georgian Co M, PA-C  FLUoxetine (PROZAC) 10 MG capsule Take by mouth. 10/04/22 03/09/23  [provider]  FLUoxetine (PROZAC) 20 MG capsule Take 20 mg by mouth every morning. 03/14/23   [provider]  hydrochlorothiazide (HYDRODIURIL) 25 MG tablet Take 1 tablet (25 mg total) by mouth daily. 08/02/23 08/01/24  Anders Simmonds, PA-C  NIFEdipine (ADALAT CC) 30 MG 24 hr tablet Take 1 tablet (30 mg total) by mouth daily. 08/02/23   Anders Simmonds,  PA-C  topiramate (TOPAMAX) 50 MG tablet Take 50 mg by mouth at bedtime. 12/20/22   [provider]    ROS: Neg HEENT Neg resp Neg cardiac Neg GI Neg GU Neg MS Neg psych Neg neuro  Objective:   Vitals:   08/02/23 0844  BP: 128/83  Pulse: 65  Temp: 98.1 F (36.7 C)  TempSrc: Oral  SpO2: 96%  Weight: 208 lb 6.4 oz (94.5 kg)  Height: 5\' 9"  (1.753 m)   Exam General appearance : Awake, alert, not in any distress. Speech Clear. Not toxic looking HEENT: Atraumatic and Normocephalic Neck: Supple, no JVD. No cervical lymphadenopathy.  Chest: Good air entry bilaterally, CTAB.  No rales/rhonchi/wheezing CVS: S1 S2 regular, no murmurs.  Extremities: B/L Lower Ext shows no edema, both legs are warm to touch Neurology: Awake alert, and oriented X 3, CN II-XII intact, Non focal Skin: No Rash  Data Review Lab Results  Component Value Date   HGBA1C 5.7 (H) 01/10/2023    Assessment & Plan   1. Prediabetes A1C=5.8 6 weeks ago(not in Epic) Work at a goal of eliminating sugary drinks, candy, desserts, sweets, refined sugars, processed foods, and white carbohydrates.   - Semaglutide-Weight Management 0.25 MG/0.5ML SOAJ; Inject 0.25 mg into the skin once a week.  Dispense: 2 mL; Refill: 0  2. Immunization due (Primary) She is scheduling  this for next Friday 07/2019 - Varicella-zoster vaccine IM  3. Shift work sleep disorder Likely a component of weight gain and mental health  4. Primary hypertension Continue current regimen - hydrochlorothiazide (HYDRODIURIL) 25 MG tablet; Take 1 tablet (25 mg total) by mouth daily.  Dispense: 90 tablet; Refill: 1 - NIFEdipine (ADALAT CC) 30 MG 24 hr tablet; Take 1 tablet (30 mg total) by mouth daily.  Dispense: 90 tablet; Refill: 0 -check BMP at visit with Luke(BMP ordered)  5. Obesity (BMI 30-39.9) - Semaglutide-Weight Management 0.25 MG/0.5ML SOAJ; Inject 0.25 mg into the skin once a week.  Dispense: 2 mL; Refill: 0    Return in  about 1 month (around 09/02/2023) for Luke-titrate wegovy if patient doing well; 3 months PCP for a1C.  The patient was given clear instructions to go to ER or return to medical center if symptoms don't improve, worsen or new problems develop. The patient verbalized understanding. The patient was told to call to get lab results if they haven't heard anything in the next week.      Georgian Co, PA-C [facility] [address] [place], [place] [phone]   08/02/2023, 9:44 AM

## 2023-08-02 NOTE — Progress Notes (Deleted)
 Marland Kitchen

## 2023-08-06 ENCOUNTER — Ambulatory Visit (HOSPITAL_COMMUNITY): Payer: Self-pay

## 2023-08-10 ENCOUNTER — Encounter

## 2023-08-10 ENCOUNTER — Ambulatory Visit

## 2023-08-17 ENCOUNTER — Other Ambulatory Visit: Payer: Self-pay

## 2023-08-17 ENCOUNTER — Telehealth: Payer: Self-pay

## 2023-08-17 ENCOUNTER — Telehealth: Payer: Self-pay | Admitting: Nurse Practitioner

## 2023-08-17 ENCOUNTER — Ambulatory Visit
Admission: RE | Admit: 2023-08-17 | Discharge: 2023-08-17 | Disposition: A | Discharge status: Home / Self Care | Source: Ambulatory Visit | Attending: Nurse Practitioner | Admitting: Nurse Practitioner

## 2023-08-17 ENCOUNTER — Ambulatory Visit

## 2023-08-17 DIAGNOSIS — Z1231 Encounter for screening mammogram for malignant neoplasm of breast: Secondary | ICD-10-CM

## 2023-08-17 NOTE — Telephone Encounter (Signed)
 Pharmacy Patient Advocate Encounter   Received notification from Physician's Office that prior authorization for Brandi Collins, Brandi Collins is required/requested.   Insurance verification completed.   The patient is insured through [facility] .   Per test claim: PA required; PA submitted to above mentioned insurance via CoverMyMeds Key/confirmation #/EOC [id number] Status is pending

## 2023-08-17 NOTE — Telephone Encounter (Signed)
 Thank you :)

## 2023-08-17 NOTE — Telephone Encounter (Signed)
 Understood. Thank you.

## 2023-08-17 NOTE — Telephone Encounter (Addendum)
 The patient came into the office today regarding her prescription for Semaglutide [facility]) for weight management. She stated that she she contacted her pharmacy, and they informed her that a prior authorization (PA) is required from her provider. The patient will like a call back regarding the status of the prior authorization.   Patient call back number: [phone]

## 2023-08-22 ENCOUNTER — Encounter: Payer: Self-pay | Admitting: Nurse Practitioner

## 2023-08-31 ENCOUNTER — Other Ambulatory Visit: Payer: Self-pay | Admitting: Nurse Practitioner

## 2023-08-31 DIAGNOSIS — I10 Essential (primary) hypertension: Secondary | ICD-10-CM

## 2023-09-06 ENCOUNTER — Ambulatory Visit: Admitting: Pharmacist

## 2023-09-08 ENCOUNTER — Encounter (HOSPITAL_COMMUNITY): Payer: Self-pay

## 2023-09-08 ENCOUNTER — Ambulatory Visit (HOSPITAL_COMMUNITY)
Admission: EM | Admit: 2023-09-08 | Discharge: 2023-09-08 | Disposition: A | Discharge status: Home / Self Care | Attending: Emergency Medicine | Admitting: Emergency Medicine

## 2023-09-08 DIAGNOSIS — Z9889 Other specified postprocedural states: Secondary | ICD-10-CM | POA: Diagnosis not present

## 2023-09-08 DIAGNOSIS — R220 Localized swelling, mass and lump, head: Secondary | ICD-10-CM

## 2023-09-08 MED ORDER — METHYLPREDNISOLONE SODIUM SUCC 125 MG IJ SOLR
60.0000 mg | Freq: Once | INTRAMUSCULAR | Status: AC
  Administered 2023-09-08: 60 mg via INTRAMUSCULAR

## 2023-09-08 MED ORDER — METHYLPREDNISOLONE SODIUM SUCC 125 MG IJ SOLR
INTRAMUSCULAR | Status: AC
  Filled 2023-09-08: qty 2

## 2023-09-08 NOTE — Discharge Instructions (Signed)
 The steroid injection should hopefully bring down inflammation -- it can start to work in about 30 minutes. Continue alternating ibuprofen  and Tylenol  for pain and swelling Please continue and finish the antibiotic  I recommend to call your dentist and let them know of your symptoms.  If you are feeling better, you can wait until your Monday follow up visit.

## 2023-09-08 NOTE — ED Triage Notes (Signed)
 Pt states got 15 teeth pulled on 4/17. C/o mouth swelling and painful. States started amoxicillin  on 4/17.

## 2023-09-08 NOTE — ED Provider Notes (Signed)
 [facility]    CSN: [id number] Arrival date & time: 09/08/23  1535      History   Chief Complaint Chief Complaint  Patient presents with   Oral Swelling    HPI Brandi Collins is a 58 y.o. female.  Patient presents with 2 day history of mouth pain and swelling She reports having 15 teeth pulled on 4/17. Since then having symptoms. Today feels more facial swelling. Pain is fairly controlled with ibuprofen  and tylenol . She was started on amoxicillin . Has taken 3 doses so far. Denies swelling of lips or tongue. No shortness of breath, wheezing, or trouble swallowing.  No fevers  Past Medical History:  Diagnosis Date   ADHD (attention deficit hyperactivity disorder)    Bipolar disorder (HCC)    Depression    Hypertension    Lateral meniscus tear    left    Patient Active Problem List   Diagnosis Date Noted   Bipolar disorder, mixed (HCC) 08/22/2022   Synovitis of knee    Acute lateral meniscus tear of left knee 04/08/2019   Pain of cervical spine 03/04/2018   Screening examination for pulmonary tuberculosis 05/05/2016   Acute bilateral low back pain without sciatica 03/27/2016   Health care maintenance 04/22/2015   Essential hypertension, benign 04/22/2015   Annual physical exam 04/28/2013   Pap smear for cervical cancer screening 04/28/2013   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     OB History   No obstetric history on file.      Home Medications    Prior to Admission medications   Medication Sig Start Date End Date  Taking? Authorizing Provider  FLUoxetine (PROZAC) 10 MG capsule Take by mouth. 10/04/22 03/09/23  [provider]  FLUoxetine (PROZAC) 20 MG capsule Take 20 mg by mouth every morning. 03/14/23   [provider]  hydrochlorothiazide  (HYDRODIURIL ) 25 MG tablet Take 1 tablet (25 mg total) by mouth daily. 08/02/23 08/01/24  McClung, Angela M, PA-C  NIFEdipine  (ADALAT  CC) 30 MG 24 hr tablet TAKE 1 TABLET BY MOUTH EVERY DAY 08/31/23   Newlin, Enobong, MD  Semaglutide -Weight Management 0.25 MG/0.5ML SOAJ Inject 0.25 mg into the skin once a week. 08/02/23   Hassie Lint, PA-C  topiramate (TOPAMAX) 50 MG tablet Take 50 mg by mouth at bedtime. 12/20/22   [provider]    Family History Family History  Problem Relation Age of Onset   Hypertension Mother    Heart disease Mother    Stroke Mother    Hypertension Daughter    Colon cancer Neg Hx    Colon polyps Neg Hx    Esophageal cancer Neg Hx    Rectal cancer Neg Hx    Stomach cancer Neg Hx     Social History Social History   Tobacco Use   Smoking status: Never   Smokeless tobacco: Never  Vaping Use   Vaping status: Never Used  Substance Use Topics   Alcohol use: No   Drug use: Not Currently    Types: Marijuana    Comment:  I don't use marijuana anymore     Allergies   Patient has no known allergies.   Review of Systems Review of Systems Per HPI  Physical Exam Triage Vital Signs ED Triage Vitals  Encounter Vitals Group     BP 09/08/23 1625 124/80     Systolic BP Percentile --      Diastolic BP Percentile --      Pulse Rate 09/08/23 1625 75     Resp 09/08/23 1625 18     Temp 09/08/23 1625 98.2 F (36.8 C)     Temp Source 09/08/23 1625 Oral     SpO2 09/08/23 1625 91 %     Weight --      Height --      Head Circumference --      Peak Flow --      Pain Score 09/08/23 1626 8     Pain Loc --      Pain Education --      Exclude from Growth Chart --    No data found.  Updated Vital  Signs BP 124/80 (BP Location: Left Arm)   Pulse 75   Temp 98.2 F (36.8 C) (Oral)   Resp 18   SpO2 91%   Physical Exam Vitals and nursing note reviewed.  Constitutional:      Appearance: She is not ill-appearing.  HENT:     Head:     Comments: Localized mild swelling around upper jaw    Mouth/Throat:     Mouth: Mucous membranes are moist.     Dentition: Abnormal dentition. Gingival swelling present.     Pharynx: Oropharynx is clear. No posterior oropharyngeal erythema.     Comments: Multiple teeth missing. Sutures in gums. Mild swelling of upper gums and tenderness Eyes:     Conjunctiva/sclera: Conjunctivae normal.  Cardiovascular:     Rate and Rhythm: Normal rate and regular rhythm.     Pulses: Normal pulses.     Heart sounds: Normal heart sounds.  Pulmonary:     Effort: Pulmonary effort is normal.     Breath sounds: Normal breath sounds.  Musculoskeletal:     Cervical back: Normal range of motion.  Lymphadenopathy:     Cervical: No cervical adenopathy.  Skin:    General: Skin is warm and dry.  Neurological:     Mental Status: She is alert and oriented to person, place, and time.     UC Treatments / Results  Labs (all labs ordered are listed, but only abnormal results are displayed) Labs Reviewed - No data to display  EKG  Radiology No results found.  Procedures Procedures   Medications Ordered in UC Medications  methylPREDNISolone  sodium succinate (SOLU-MEDROL ) 125 mg/2 mL injection 60 mg (has no administration in time range)    Initial Impression / Assessment and Plan / UC Course  I have reviewed the triage vital signs and the nursing notes.  Pertinent labs & imaging results that were available during my care of the patient were reviewed by me and considered in my medical decision making (see chart for details).  Afebrile, stable vitals  Post dental extractions. Swelling increased today, pain fairly controlled. Offered IM steroid. Recommend continue  alternating ibuprofen  and tylenol . Advised can take 3 full days of antibiotic to note improvement. I have recommended she call the on call dentist to discuss symptoms. Strict ED precautions. She has follow up appointment on Monday otherwise. Patient agrees to plan, no questions at this time. A work note  is provided    Final Clinical Impressions(s) / UC Diagnoses   Final diagnoses:  Facial swelling  History of recent dental procedure     Discharge Instructions      The steroid injection should hopefully bring down inflammation -- it can start to work in about 30 minutes. Continue alternating ibuprofen  and Tylenol  for pain and swelling Please continue and finish the antibiotic  I recommend to call your dentist and let them know of your symptoms.  If you are feeling better, you can wait until your Monday follow up visit.      ED Prescriptions   None    I have reviewed the PDMP during this encounter.   Creighton Doffing, [place] 09/08/23 1717

## 2023-09-09 ENCOUNTER — Ambulatory Visit (HOSPITAL_COMMUNITY): Payer: Self-pay

## 2023-10-12 ENCOUNTER — Ambulatory Visit: Payer: 59 | Admitting: Nurse Practitioner

## 2023-10-29 ENCOUNTER — Ambulatory Visit: Attending: Nurse Practitioner | Admitting: Nurse Practitioner

## 2023-10-29 ENCOUNTER — Encounter: Payer: Self-pay | Admitting: Nurse Practitioner

## 2023-10-29 VITALS — BP 106/71 | HR 75 | Resp 18 | Ht 69.0 in | Wt 201.4 lb

## 2023-10-29 DIAGNOSIS — E559 Vitamin D deficiency, unspecified: Secondary | ICD-10-CM

## 2023-10-29 DIAGNOSIS — I10 Essential (primary) hypertension: Secondary | ICD-10-CM

## 2023-10-29 DIAGNOSIS — E7800 Pure hypercholesterolemia, unspecified: Secondary | ICD-10-CM

## 2023-10-29 DIAGNOSIS — R7989 Other specified abnormal findings of blood chemistry: Secondary | ICD-10-CM | POA: Diagnosis not present

## 2023-10-29 DIAGNOSIS — R7303 Prediabetes: Secondary | ICD-10-CM | POA: Diagnosis not present

## 2023-10-29 MED ORDER — NIFEDIPINE ER 30 MG PO TB24: 30.0000 mg | ORAL_TABLET | Freq: Every day | ORAL | 1 refills | Status: DC

## 2023-10-29 NOTE — Progress Notes (Signed)
 Assessment & Plan:   Brandi Collins was seen today for hypertension.  Diagnoses and all orders for this visit:  Primary hypertension -     CMP14+EGFR -     NIFEdipine  (ADALAT  CC) 30 MG 24 hr tablet; Take 1 tablet (30 mg total) by mouth daily. Continue all antihypertensives as prescribed.  Reminded to bring in blood pressure log for follow  up appointment.  RECOMMENDATIONS: DASH/Mediterranean Diets are healthier choices for HTN.     Hypercholesterolemia -     Lipid panel INSTRUCTIONS: Work on a low fat, heart healthy diet and participate in regular aerobic exercise program by working out at least 150 minutes per week; 5 days a week-30 minutes per day. Avoid red meat/beef/steak,  fried foods. junk foods, sodas, sugary drinks, unhealthy snacking, alcohol and smoking.  Drink at least 80 oz of water per day and monitor your carbohydrate intake daily.    Abnormal CBC -     CBC with Differential  Prediabetes -     CMP14+EGFR -     Hemoglobin A1c  Vitamin D  deficiency disease -     VITAMIN D  25 Hydroxy (Vit-D Deficiency, Fractures)    Patient has been counseled on age-appropriate routine health concerns for screening and prevention. These are reviewed and up-to-date. Referrals have been placed accordingly. Immunizations are up-to-date or declined.    Subjective:   Chief Complaint  Patient presents with   Hypertension    Brandi Collins 58 y.o. female presents to office today for follow up to HTN  She has a past medical history of ADHD, Bipolar disorder, Depression, Hypertension, and Lateral meniscus tear.    HTN Blood pressure is well controlled. She is taking hydrochlorothiazide  and nifedipine  30 mg daily as prescribed BP Readings from Last 3 Encounters:  10/29/23 106/71  09/08/23 124/80  08/02/23 128/83     Patient has been counseled on age-appropriate routine health concerns for screening and prevention. These are reviewed and up-to-date. Referrals have been placed  accordingly. Immunizations are up-to-date or declined.     MAMMOGRAM: UTD COLONOSCOPY: UTD PAP SMEAR: UTD  Review of Systems  Constitutional:  Negative for fever, malaise/fatigue and weight loss.  HENT: Negative.  Negative for nosebleeds.   Eyes: Negative.  Negative for blurred vision, double vision and photophobia.  Respiratory: Negative.  Negative for cough and shortness of breath.   Cardiovascular: Negative.  Negative for chest pain, palpitations and leg swelling.  Gastrointestinal: Negative.  Negative for heartburn, nausea and vomiting.  Musculoskeletal: Negative.  Negative for myalgias.  Neurological: Negative.  Negative for dizziness, focal weakness, seizures and headaches.  Psychiatric/Behavioral: Negative.  Negative for suicidal ideas.     Past Medical History:  Diagnosis Date   ADHD (attention deficit hyperactivity disorder)    Bipolar disorder (HCC)    Depression    Hypertension    Lateral meniscus tear    left    Past Surgical History:  Procedure Laterality Date   KNEE ARTHROSCOPY WITH LATERAL MENISECTOMY Left 04/11/2019   Procedure: LEFT KNEE ARTHROSCOPY WITH PARTIAL LATERAL MENISECTOMY;  Surgeon: Wes Hamman, MD;  Location: [facility];  Service: Orthopedics;  Laterality: Left;   THROAT SURGERY     patient was 58 years old.     Family History  Problem Relation Age of Onset   Hypertension Mother    Heart disease Mother    Stroke Mother    Hypertension Daughter    Colon cancer Neg Hx    Colon polyps Neg  Hx    Esophageal cancer Neg Hx    Rectal cancer Neg Hx    Stomach cancer Neg Hx     Social History Reviewed with no changes to be made today.   Outpatient Medications Prior to Visit  Medication Sig Dispense Refill   FLUoxetine (PROZAC) 20 MG capsule Take 20 mg by mouth every morning.     hydrochlorothiazide  (HYDRODIURIL ) 25 MG tablet Take 1 tablet (25 mg total) by mouth daily. 90 tablet 1   topiramate (TOPAMAX) 50 MG tablet Take 50 mg by  mouth at bedtime.     NIFEdipine  (ADALAT  CC) 30 MG 24 hr tablet TAKE 1 TABLET BY MOUTH EVERY DAY 90 tablet 0   FLUoxetine (PROZAC) 10 MG capsule Take by mouth.     Semaglutide -Weight Management 0.25 MG/0.5ML SOAJ Inject 0.25 mg into the skin once a week. (Patient not taking: Reported on 10/29/2023) 2 mL 0   No facility-administered medications prior to visit.    No Known Allergies     Objective:    BP 106/71 (BP Location: Left Arm, Patient Position: Sitting, Cuff Size: Normal)   Pulse 75   Resp 18   Ht 5\' 9"  (1.753 m)   Wt 201 lb 6.4 oz (91.4 kg)   SpO2 94%   BMI 29.74 kg/m  Wt Readings from Last 3 Encounters:  10/29/23 201 lb 6.4 oz (91.4 kg)  08/02/23 208 lb 6.4 oz (94.5 kg)  06/14/23 208 lb 9.6 oz (94.6 kg)    Physical Exam Vitals and nursing note reviewed.  Constitutional:      Appearance: She is well-developed.  HENT:     Head: Normocephalic and atraumatic.  Cardiovascular:     Rate and Rhythm: Normal rate and regular rhythm.     Heart sounds: Normal heart sounds. No murmur heard.    No friction rub. No gallop.  Pulmonary:     Effort: Pulmonary effort is normal. No tachypnea or respiratory distress.     Breath sounds: Normal breath sounds. No decreased breath sounds, wheezing, rhonchi or rales.  Chest:     Chest wall: No tenderness.  Abdominal:     General: Bowel sounds are normal.     Palpations: Abdomen is soft.  Musculoskeletal:        General: Normal range of motion.     Cervical back: Normal range of motion.  Skin:    General: Skin is warm and dry.  Neurological:     Mental Status: She is alert and oriented to person, place, and time.     Coordination: Coordination normal.  Psychiatric:        Behavior: Behavior normal. Behavior is cooperative.        Thought Content: Thought content normal.        Judgment: Judgment normal.          Patient has been counseled extensively about nutrition and exercise as well as the importance of adherence with  medications and regular follow-up. The patient was given clear instructions to go to ER or return to medical center if symptoms don't improve, worsen or new problems develop. The patient verbalized understanding.   Follow-up: Return in about 14 weeks (around 02/04/2024).   Collins Dean, FNP-BC [facility] [place], [place] [phone]   10/29/2023, 9:58 AM

## 2023-10-29 NOTE — Progress Notes (Signed)
 Wants to get a screening to see if she has CHF

## 2023-10-30 LAB — CMP14+EGFR
ALT: 20 IU/L (ref 0–32)
AST: 21 IU/L (ref 0–40)
Albumin: 4.3 g/dL (ref 3.8–4.9)
Alkaline Phosphatase: 63 IU/L (ref 44–121)
BUN/Creatinine Ratio: 17 (ref 9–23)
BUN: 29 mg/dL — ABNORMAL HIGH (ref 6–24)
Bilirubin Total: 0.3 mg/dL (ref 0.0–1.2)
CO2: 23 mmol/L (ref 20–29)
Calcium: 9.5 mg/dL (ref 8.7–10.2)
Chloride: 103 mmol/L (ref 96–106)
Creatinine, Ser: 1.66 mg/dL — ABNORMAL HIGH (ref 0.57–1.00)
Globulin, Total: 2.7 g/dL (ref 1.5–4.5)
Glucose: 73 mg/dL (ref 70–99)
Potassium: 4.1 mmol/L (ref 3.5–5.2)
Sodium: 143 mmol/L (ref 134–144)
Total Protein: 7 g/dL (ref 6.0–8.5)
eGFR: 35 mL/min/{1.73_m2} — ABNORMAL LOW (ref >59)

## 2023-10-30 LAB — CBC WITH DIFFERENTIAL/PLATELET
Basophils Absolute: 0.1 10*3/uL (ref 0.0–0.2)
Basos: 1 %
EOS (ABSOLUTE): 0.1 10*3/uL (ref 0.0–0.4)
Eos: 1 %
Hematocrit: 38.9 % (ref 34.0–46.6)
Hemoglobin: 12.9 g/dL (ref 11.1–15.9)
Immature Grans (Abs): 0 10*3/uL (ref 0.0–0.1)
Immature Granulocytes: 0 %
Lymphocytes Absolute: 3 10*3/uL (ref 0.7–3.1)
Lymphs: 41 %
MCH: 29.3 pg (ref 26.6–33.0)
MCHC: 33.2 g/dL (ref 31.5–35.7)
MCV: 88 fL (ref 79–97)
Monocytes Absolute: 0.8 10*3/uL (ref 0.1–0.9)
Monocytes: 11 %
Neutrophils Absolute: 3.4 10*3/uL (ref 1.4–7.0)
Neutrophils: 46 %
Platelets: 203 10*3/uL (ref 150–450)
RBC: 4.41 x10E6/uL (ref 3.77–5.28)
RDW: 14.7 % (ref 11.7–15.4)
WBC: 7.4 10*3/uL (ref 3.4–10.8)

## 2023-10-30 LAB — LIPID PANEL
Chol/HDL Ratio: 2.5 ratio (ref 0.0–4.4)
Cholesterol, Total: 154 mg/dL (ref 100–199)
HDL: 61 mg/dL
LDL Chol Calc (NIH): 82 mg/dL (ref 0–99)
Triglycerides: 50 mg/dL (ref 0–149)
VLDL Cholesterol Cal: 11 mg/dL (ref 5–40)

## 2023-10-30 LAB — HEMOGLOBIN A1C
Est. average glucose Bld gHb Est-mCnc: 108 mg/dL
Hgb A1c MFr Bld: 5.4 % (ref 4.8–5.6)

## 2023-10-30 LAB — VITAMIN D 25 HYDROXY (VIT D DEFICIENCY, FRACTURES): Vit D, 25-Hydroxy: 35.6 ng/mL (ref 30.0–100.0)

## 2023-10-31 ENCOUNTER — Ambulatory Visit: Admitting: Nurse Practitioner

## 2023-11-05 ENCOUNTER — Ambulatory Visit: Payer: Self-pay | Admitting: Nurse Practitioner

## 2023-11-07 ENCOUNTER — Ambulatory Visit: Payer: Self-pay

## 2023-11-07 DIAGNOSIS — N1832 Chronic kidney disease, stage 3b: Secondary | ICD-10-CM

## 2023-11-07 NOTE — Telephone Encounter (Signed)
 Called & spoke to the patient. Verified name & DOB. Informed of recommendations. Informed of nephrology referral placed. Patient expressed verbal understanding of all discussed.

## 2023-11-07 NOTE — Telephone Encounter (Signed)
  FYI Only or Action Required?: FYI only for provider  Patient was last seen in primary care on 10/29/2023 by Collins Dean, NP. Called Nurse Triage reporting lab follow up call. Symptoms began n/a. Interventions attempted: Rest, hydration, or home remedies. Symptoms are: stable.  Triage Disposition: Information or Advice Only Call  Patient/caregiver understands and will follow disposition?: Yes  Copied from CRM [id number]. Topic: Clinical - Medical Advice >> Nov 07, 2023 11:00 AM Oddis Bench wrote: Reason for CRM: Patient would like to get some help with what she can do besides drinking more water to help with her kidneys. Please call patient to advise Reason for Disposition  Health Information question, no triage required and triager able to answer question  Answer Assessment - Initial Assessment Questions 1. REASON FOR CALL or QUESTION: What is your reason for calling today? or How can I best help you? or What question do you have that I can help answer?     Follow up on lab results, specifically kidney function.  Is there anything else that she should do besides drink water to improve her kidney function. Explained that this is the only recommendation at this time.  She verbalizes understanding and will follow recommedation  Protocols used: Information Only Call - No Triage-A-AH

## 2023-11-07 NOTE — Telephone Encounter (Signed)
 Let patient know that I am covering for Winda Hastings who is on vacation. In regards to her kidneys, besides staying hydrated, she should avoid over-the-counter pain medications like ibuprofen , Aleve , Advil , Naprosyn  and Motrin  as these can make kidney function worse.  I will also refer her to a kidney specialist for further evaluation and management.

## 2024-02-04 ENCOUNTER — Ambulatory Visit: Payer: Self-pay | Attending: Nurse Practitioner | Admitting: Nurse Practitioner

## 2024-02-04 ENCOUNTER — Encounter: Payer: Self-pay | Admitting: Nurse Practitioner

## 2024-02-04 VITALS — BP 137/89 | HR 61 | Resp 19 | Ht 69.0 in | Wt 211.2 lb

## 2024-02-04 DIAGNOSIS — M6740 Ganglion, unspecified site: Secondary | ICD-10-CM

## 2024-02-04 DIAGNOSIS — I10 Essential (primary) hypertension: Secondary | ICD-10-CM

## 2024-02-04 DIAGNOSIS — E669 Obesity, unspecified: Secondary | ICD-10-CM | POA: Diagnosis not present

## 2024-02-04 DIAGNOSIS — Z23 Encounter for immunization: Secondary | ICD-10-CM

## 2024-02-04 DIAGNOSIS — R7989 Other specified abnormal findings of blood chemistry: Secondary | ICD-10-CM

## 2024-02-04 DIAGNOSIS — Z79899 Other long term (current) drug therapy: Secondary | ICD-10-CM

## 2024-02-04 DIAGNOSIS — R0683 Snoring: Secondary | ICD-10-CM

## 2024-02-04 DIAGNOSIS — Z6831 Body mass index (BMI) 31.0-31.9, adult: Secondary | ICD-10-CM

## 2024-02-04 NOTE — Progress Notes (Signed)
 Assessment & Plan:  [name] was seen today for hypertension.  Diagnoses and all orders for this visit:  Primary hypertension -     CMP14+EGFR  Stage 1 hypertension with increased diastolic pressure. Previously controlled on nifedipine , discontinued in June. Current medication: hydrochlorothiazide . - Repeat kidney function tests today. - Consider restarting nifedipine  if kidney function remains a concern. - Monitor blood pressure and adjust medications as needed.   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 - Refer to dermatology for evaluation and management. Patient was urged to apply for the financial assistance program.  They were instructed to inquire at the front desk about the application process for the [employer] discount, orange card or other financial assistance.       Declining creatinine Decline in kidney function noted. Hydrochlorothiazide  may contribute to impairment. - Repeat kidney function tests today. - Consider switching from hydrochlorothiazide  to another antihypertensive if kidney function remains impaired.  Obesity Obesity with recent weight gain due to lifestyle changes and stress. Previous Wegovy  use denied by insurance. - Encourage regular physical activity, 30 minutes on treadmill at 3.0 speed, three to four times weekly. - Discuss lifestyle modifications including healthy eating and stress management.  Snoring Chronic snoring without apnea or excessive daytime sleepiness. Possible weight-related cause. - Refer to ENT for evaluation and potential surgical intervention.  Patient has been counseled on age-appropriate routine health concerns for screening and prevention. These are reviewed and up-to-date. Referrals have been placed accordingly. Immunizations are up-to-date or  declined.    Subjective:   Chief Complaint  Patient presents with   Hypertension    History of Present Illness Brandi Collins is a 58 year old female who presents with concerns about a ganglion cyst and snoring.  She has had a ganglion cyst on her left forearm for over a year. The cyst has increased in size but remains painless. She covers it with a Band-Aid for cosmetic reasons.  She is concerned about snoring, which has been a long-standing issue, even when she did not weigh over 200lbs.   No apnea episodes or excessive daytime sleepiness. She sleeps on her sides and back. Her grandson's snoring improved after adenoid removal, but she has not undergone any similar procedures.  She is attempting to lose weight, attributing previous weight gain to stress and unhealthy habits from a past relationship. Requesting an appetite suppressant. Her insurance previously denied wegovy . She is currently uninsured. She is now eating healthily, drinking water, and plans to start exercising regularly as her place of residence has exercise equipment.     Previous blood work indicated a decline in kidney function.   HTN Blood pressure is elevated today. She stopped taking nifedipine  in June when blood pressure was previously normal and is currently only taking hydrochlorothiazide  BP Readings from Last 3 Encounters:  02/04/24 137/89  10/29/23 106/71  09/08/23 124/80     Review of Systems  Constitutional:  Negative for fever, malaise/fatigue and weight loss.  HENT: Negative.  Negative for nosebleeds.   Eyes: Negative.  Negative for blurred vision, double vision and photophobia.  Respiratory: Negative.  Negative for cough and shortness of breath.   Cardiovascular: Negative.  Negative for chest pain, palpitations and leg swelling.  Gastrointestinal: Negative.  Negative for heartburn, nausea and vomiting.  Musculoskeletal: Negative.  Negative for myalgias.  Skin:  See HPI  Neurological:  Negative.  Negative for dizziness, focal weakness, seizures and headaches.  Psychiatric/Behavioral: Negative.  Negative for suicidal ideas.     Past Medical History:  Diagnosis Date   ADHD (attention deficit hyperactivity disorder)    Bipolar disorder (HCC)    Depression    Hypertension    Lateral meniscus tear    left    Past Surgical History:  Procedure Laterality Date   KNEE ARTHROSCOPY WITH LATERAL MENISECTOMY Left 04/11/2019   Procedure: LEFT KNEE ARTHROSCOPY WITH PARTIAL LATERAL MENISECTOMY;  Surgeon: Jerri Kay HERO, MD;  Location: [facility];  Service: Orthopedics;  Laterality: Left;   THROAT SURGERY     patient was 58 years old.     Family History  Problem Relation Age of Onset   Hypertension Mother    Heart disease Mother    Stroke Mother    Hypertension Daughter    Colon cancer Neg Hx    Colon polyps Neg Hx    Esophageal cancer Neg Hx    Rectal cancer Neg Hx    Stomach cancer Neg Hx     Social History Reviewed with no changes to be made today.   Outpatient Medications Prior to Visit  Medication Sig Dispense Refill   busPIRone (BUSPAR) 10 MG tablet Take 10 mg by mouth daily.     FLUoxetine (PROZAC) 20 MG capsule Take 20 mg by mouth every morning.     hydrochlorothiazide  (HYDRODIURIL ) 25 MG tablet Take 1 tablet (25 mg total) by mouth daily. 90 tablet 1   NIFEdipine  (ADALAT  CC) 30 MG 24 hr tablet Take 1 tablet (30 mg total) by mouth daily. (Patient not taking: Reported on 02/04/2024) 90 tablet 1   topiramate (TOPAMAX) 50 MG tablet Take 50 mg by mouth at bedtime.     No facility-administered medications prior to visit.    No Known Allergies     Objective:    BP 137/89 (BP Location: Left Arm, Patient Position: Sitting, Cuff Size: Normal)   Pulse 61   Resp 19   Ht 5' 9 (1.753 m)   Wt 211 lb 3.2 oz (95.8 kg)   SpO2 94%   BMI 31.19 kg/m  Wt Readings from Last 3 Encounters:  02/04/24 211 lb 3.2 oz (95.8 kg)  10/29/23 201 lb 6.4 oz (91.4 kg)   08/02/23 208 lb 6.4 oz (94.5 kg)    Physical Exam Vitals and nursing note reviewed.  Constitutional:      Appearance: She is well-developed.  HENT:     Head: Normocephalic and atraumatic.  Cardiovascular:     Rate and Rhythm: Normal rate and regular rhythm.     Heart sounds: Normal heart sounds. No murmur heard.    No friction rub. No gallop.  Pulmonary:     Effort: Pulmonary effort is normal. No tachypnea or respiratory distress.     Breath sounds: Normal breath sounds. No decreased breath sounds, wheezing, rhonchi or rales.  Chest:     Chest wall: No tenderness.  Abdominal:     General: Bowel sounds are normal.     Palpations: Abdomen is soft.  Musculoskeletal:        General: Normal range of motion.     Cervical back: Normal range of motion.  Skin:    General: Skin is warm and dry.      Neurological:     Mental Status: She is alert and oriented to person, place, and time.     Coordination: Coordination  normal.  Psychiatric:        Behavior: Behavior normal. Behavior is cooperative.        Thought Content: Thought content normal.        Judgment: Judgment normal.          Patient has been counseled extensively about nutrition and exercise as well as the importance of adherence with medications and regular follow-up. The patient was given clear instructions to go to ER or return to medical center if symptoms don't improve, worsen or new problems develop. The patient verbalized understanding.   Follow-up: Return in about 3 months (around 05/05/2024) for HTN.   Haze LELON Servant, FNP-BC [facility] [place], [place] [phone]   02/04/2024, 9:28 AM

## 2024-02-05 LAB — CMP14+EGFR
ALT: 39 IU/L — ABNORMAL HIGH (ref 0–32)
AST: 48 IU/L — ABNORMAL HIGH (ref 0–40)
Albumin: 4.2 g/dL (ref 3.8–4.9)
Alkaline Phosphatase: 93 IU/L (ref 51–125)
BUN/Creatinine Ratio: 16 (ref 9–23)
BUN: 24 mg/dL (ref 6–24)
Bilirubin Total: <0.2 mg/dL (ref 0.0–1.2)
CO2: 25 mmol/L (ref 20–29)
Calcium: 9.1 mg/dL (ref 8.7–10.2)
Chloride: 100 mmol/L (ref 96–106)
Creatinine, Ser: 1.51 mg/dL — ABNORMAL HIGH (ref 0.57–1.00)
Globulin, Total: 2.8 g/dL (ref 1.5–4.5)
Glucose: 74 mg/dL (ref 70–99)
Potassium: 4.2 mmol/L (ref 3.5–5.2)
Sodium: 141 mmol/L (ref 134–144)
Total Protein: 7 g/dL (ref 6.0–8.5)
eGFR: 40 mL/min/1.73 — ABNORMAL LOW (ref >59)

## 2024-02-06 ENCOUNTER — Ambulatory Visit: Payer: Self-pay | Admitting: Nurse Practitioner

## 2024-02-06 DIAGNOSIS — N1832 Chronic kidney disease, stage 3b: Secondary | ICD-10-CM

## 2024-03-07 ENCOUNTER — Telehealth: Payer: Self-pay | Admitting: Nurse Practitioner

## 2024-03-07 NOTE — Telephone Encounter (Signed)
 Copied from CRM [id number]. Topic: Referral - Status >> Mar 07, 2024  3:30 PM Brandi Collins wrote: Reason for CRM: Pt calling regarding nephrology referral. The nephrologist she was referred to is not in her network. Pt would like referral sent to Dr. Eulas Manes at [facility]. Located at [address]  [place], [place] [address]. Pls notify pt when referral has been sent.

## 2024-03-17 ENCOUNTER — Telehealth: Payer: Self-pay | Admitting: Nurse Practitioner

## 2024-03-17 NOTE — Telephone Encounter (Signed)
 Patient identified by name and date of birth.  Per provider patient is to continue hydrochlorothiazide , with her levels being base line. Patient confirmed understanding.

## 2024-03-17 NOTE — Telephone Encounter (Signed)
 Copied from CRM [id number]. Topic: Clinical - Medication Question >> Mar 17, 2024  3:43 PM Brandi Collins wrote:  Reason for CRM: pt called requesting to change medications. Says this is regarding her blood pressure medicine.   hydrochlorothiazide  (HYDRODIURIL ) 25 MG tablet

## 2024-04-23 ENCOUNTER — Other Ambulatory Visit: Payer: Self-pay | Admitting: Nurse Practitioner

## 2024-04-23 DIAGNOSIS — I10 Essential (primary) hypertension: Secondary | ICD-10-CM

## 2024-04-23 NOTE — Telephone Encounter (Unsigned)
 Copied from CRM #[id number]. Topic: Clinical - Medication Refill >> Apr 23, 2024 11:44 AM Nathanel BROCKS wrote: Medication: hydrochlorothiazide  (HYDRODIURIL ) 25 MG tablet  Has the patient contacted their pharmacy? No   This is the patient's preferred pharmacy:   [id number] [place], [place] - [address] AT [facility] [address] [place] [address] Phone: [phone] Fax: [phone]  Is this the correct pharmacy for this prescription? Yes If no, delete pharmacy and type the correct one.   Has the prescription been filled recently? Yes  Is the patient out of the medication? Yes  Has the patient been seen for an appointment in the last year OR does the patient have an upcoming appointment? Yes  Can we respond through MyChart? Yes  Agent: Please be advised that Rx refills may take up to 3 business days. We ask that you follow-up with your pharmacy.

## 2024-04-25 NOTE — Telephone Encounter (Signed)
 Requested medications are due for refill today.  yes  Requested medications are on the active medications list.  yes  Last refill. 08/02/2023 #90 1 rf  Future visit scheduled.   yes  Notes to clinic.  Jon Moores signed rx.    Requested Prescriptions  Pending Prescriptions Disp Refills   hydrochlorothiazide  (HYDRODIURIL ) 25 MG tablet 90 tablet 1    Sig: Take 1 tablet (25 mg total) by mouth daily.     Cardiovascular: Diuretics - Thiazide Failed - 04/25/2024  5:20 PM      Failed - Cr in normal range and within 180 days    Creat  Date Value Ref Range Status  06/28/2016 1.28 (H) 0.50 - 1.05 mg/dL Final    Comment:      For patients > or = 59 years of age: The upper reference limit for Creatinine is approximately 13% higher for people identified as African-American.      Creatinine, Ser  Date Value Ref Range Status  02/04/2024 1.51 (H) 0.57 - 1.00 mg/dL Final   Creatinine,U  Date Value Ref Range Status  04/22/2015 CANCELED mg/dL     Comment:       Cutoff Values for Urine Drug Screen, Pain Mgmt          Drug Class           Cutoff (ng/mL)          Amphetamines             500          Barbiturates             200          Cocaine Metabolites      150          Benzodiazepines          200          Methadone                300          Opiates                  300          Phencyclidine             25          Propoxyphene             300          Marijuana Metabolites     50    For medical purposes only.  Result canceled by the ancillary          Passed - K in normal range and within 180 days    Potassium  Date Value Ref Range Status  02/04/2024 4.2 3.5 - 5.2 mmol/L Final         Passed - Na in normal range and within 180 days    Sodium  Date Value Ref Range Status  02/04/2024 141 134 - 144 mmol/L Final         Passed - Last BP in normal range    BP Readings from Last 1 Encounters:  02/04/24 137/89         Passed - Valid encounter within last 6  months    Recent Outpatient Visits           2 months ago Primary hypertension   [facility] [place]. [facility]  Theotis Haze ORN, NP   5 months ago Primary hypertension   [facility] [place]. [facility] Theotis Haze ORN, NP   8 months ago Immunization due   [facility] [place]. [facility], Jon M, [place]   10 months ago Prediabetes   [facility] [place]. [facility], Jon HERO, [place]   1 year ago Encounter to establish care   [facility] [place]. [facility] Theotis Haze ORN, [place]

## 2024-04-26 MED ORDER — HYDROCHLOROTHIAZIDE 25 MG PO TABS: 25.0000 mg | ORAL_TABLET | Freq: Every day | ORAL | 1 refills | Status: DC

## 2024-05-12 ENCOUNTER — Other Ambulatory Visit: Payer: Self-pay | Admitting: Medical Genetics

## 2024-05-16 ENCOUNTER — Telehealth: Payer: Self-pay | Admitting: Nurse Practitioner

## 2024-05-16 NOTE — Telephone Encounter (Addendum)
 Contacted patient; unable to reach. Left voicemail with appointment details.

## 2024-05-19 ENCOUNTER — Other Ambulatory Visit (HOSPITAL_COMMUNITY)
Admission: RE | Admit: 2024-05-19 | Discharge: 2024-05-19 | Disposition: A | Discharge status: Home / Self Care | Source: Ambulatory Visit | Attending: Nurse Practitioner | Admitting: Nurse Practitioner

## 2024-05-19 ENCOUNTER — Ambulatory Visit: Payer: Self-pay | Admitting: Nurse Practitioner

## 2024-05-19 ENCOUNTER — Ambulatory Visit: Payer: Self-pay | Attending: Nurse Practitioner | Admitting: Nurse Practitioner

## 2024-05-19 ENCOUNTER — Encounter: Payer: Self-pay | Admitting: Nurse Practitioner

## 2024-05-19 VITALS — BP 112/77 | HR 77 | Ht 69.0 in | Wt 219.4 lb

## 2024-05-19 DIAGNOSIS — Z1211 Encounter for screening for malignant neoplasm of colon: Secondary | ICD-10-CM

## 2024-05-19 DIAGNOSIS — I10 Essential (primary) hypertension: Secondary | ICD-10-CM | POA: Diagnosis not present

## 2024-05-19 DIAGNOSIS — R7303 Prediabetes: Secondary | ICD-10-CM | POA: Diagnosis not present

## 2024-05-19 DIAGNOSIS — M545 Low back pain, unspecified: Secondary | ICD-10-CM | POA: Diagnosis not present

## 2024-05-19 DIAGNOSIS — B9689 Other specified bacterial agents as the cause of diseases classified elsewhere: Secondary | ICD-10-CM

## 2024-05-19 DIAGNOSIS — N898 Other specified noninflammatory disorders of vagina: Secondary | ICD-10-CM | POA: Diagnosis not present

## 2024-05-19 DIAGNOSIS — N1832 Chronic kidney disease, stage 3b: Secondary | ICD-10-CM | POA: Diagnosis not present

## 2024-05-19 LAB — POCT URINE DIPSTICK
Bilirubin, UA: NEGATIVE
Blood, UA: NEGATIVE
Glucose, UA: NEGATIVE mg/dL
Ketones, POC UA: NEGATIVE mg/dL
Leukocytes, UA: NEGATIVE
Nitrite, UA: NEGATIVE
POC PROTEIN,UA: NEGATIVE
Spec Grav, UA: 1.02
Urobilinogen, UA: 0.2 U/dL
pH, UA: 6

## 2024-05-19 NOTE — Progress Notes (Signed)
 "  Assessment & Plan:  Brandi Collins was seen today for hypertension and back pain.  Diagnoses and all orders for this visit:  Primary hypertension Continue HCTZ as prescribed. CMP PENDING Reminded to bring in blood pressure log for follow  up appointment.  RECOMMENDATIONS: DASH/Mediterranean Diets are healthier choices for HTN.    Colon cancer screening -     Fecal occult blood, imunochemical  Acute midline low back pain without sciatica -     CMP14+EGFR -     POCT URINE DIPSTICK -     Cervicovaginal ancillary only  CKD stage 3b, GFR 30-44 ml/min (HCC) -     CMP14+EGFR F/U With nephrology in February  We may need to DC hydrochlorothiazide  and start new BP medication based on CMP  Prediabetes -     CMP14+EGFR -     Hemoglobin A1c   Vaginal Odor -     Cervicovaginal ancillary only   Patient has been counseled on age-appropriate routine health concerns for screening and prevention. These are reviewed and up-to-date. Referrals have been placed accordingly. Immunizations are up-to-date or declined.    Subjective:   Chief Complaint  Patient presents with   Hypertension   Back Pain    Lower back pain started a week ago.     Brandi Collins 58 y.o. female presents to office today for follow up to HTN and with acute low back pain.    She has a past medical history of ADHD, Bipolar disorder, Depression, Hypertension, Low back pain, and Lateral meniscus tear.   HTN Blood pressure is well controlled. She is currently taking hydrochlorothiazide  25 mg daily. No longer taking nifedipine  due to normal blood pressure readings. Creatinine has been elevated but stable over the past several months. She has an upcoming appt with nephrology in February.  BP Readings from Last 3 Encounters:  05/19/24 112/77  02/04/24 137/89  10/29/23 106/71      BACK PAIN She is experiencing acute low back pain that is different from her past reported chronic back pain. She also notes an unusual odor.  She denies dysuria, hematuria or any abnormal vaginal discharge.    She is requesting ozempic  for weight loss. She is not diabetic so she was instructed that her insurance will not cover ozempic , trulicity or victoza.     Review of Systems  Constitutional:  Negative for fever, malaise/fatigue and weight loss.  HENT: Negative.  Negative for nosebleeds.   Eyes: Negative.  Negative for blurred vision, double vision and photophobia.  Respiratory: Negative.  Negative for cough and shortness of breath.   Cardiovascular: Negative.  Negative for chest pain, palpitations and leg swelling.  Gastrointestinal: Negative.  Negative for heartburn, nausea and vomiting.  Genitourinary:        SEE HPI  Musculoskeletal:  Positive for back pain. Negative for myalgias.  Neurological: Negative.  Negative for dizziness, focal weakness, seizures and headaches.  Psychiatric/Behavioral: Negative.  Negative for suicidal ideas.     Past Medical History:  Diagnosis Date   ADHD (attention deficit hyperactivity disorder)    Bipolar disorder (HCC)    Depression    Hypertension    Lateral meniscus tear    left    Past Surgical History:  Procedure Laterality Date   KNEE ARTHROSCOPY WITH LATERAL MENISECTOMY Left 04/11/2019   Procedure: LEFT KNEE ARTHROSCOPY WITH PARTIAL LATERAL MENISECTOMY;  Surgeon: Jerri Kay HERO, MD;  Location: [facility];  Service: Orthopedics;  Laterality: Left;   THROAT SURGERY  patient was 58 years old.     Family History  Problem Relation Age of Onset   Hypertension Mother    Heart disease Mother    Stroke Mother    Hypertension Daughter    Colon cancer Neg Hx    Colon polyps Neg Hx    Esophageal cancer Neg Hx    Rectal cancer Neg Hx    Stomach cancer Neg Hx     Social History Reviewed with no changes to be made today.   Outpatient Medications Prior to Visit  Medication Sig Dispense Refill   busPIRone (BUSPAR) 10 MG tablet Take 10 mg by mouth daily.      FLUoxetine (PROZAC) 20 MG capsule Take 20 mg by mouth every morning.     hydrochlorothiazide  (HYDRODIURIL ) 25 MG tablet Take 1 tablet (25 mg total) by mouth daily. 90 tablet 1   NIFEdipine  (ADALAT  CC) 30 MG 24 hr tablet Take 1 tablet (30 mg total) by mouth daily. (Patient not taking: Reported on 02/04/2024) 90 tablet 1   No facility-administered medications prior to visit.    Allergies[1]     Objective:    BP 112/77 (BP Location: Left Arm, Patient Position: Sitting, Cuff Size: Normal)   Pulse 77   Ht 5' 9 (1.753 m)   Wt 219 lb 6.4 oz (99.5 kg)   SpO2 100%   BMI 32.40 kg/m  Wt Readings from Last 3 Encounters:  05/19/24 219 lb 6.4 oz (99.5 kg)  02/04/24 211 lb 3.2 oz (95.8 kg)  10/29/23 201 lb 6.4 oz (91.4 kg)    Physical Exam Vitals and nursing note reviewed.  Constitutional:      Appearance: She is well-developed.  HENT:     Head: Normocephalic and atraumatic.  Cardiovascular:     Rate and Rhythm: Normal rate and regular rhythm.     Heart sounds: Normal heart sounds. No murmur heard.    No friction rub. No gallop.  Pulmonary:     Effort: Pulmonary effort is normal. No tachypnea or respiratory distress.     Breath sounds: Normal breath sounds. No decreased breath sounds, wheezing, rhonchi or rales.  Chest:     Chest wall: No tenderness.  Musculoskeletal:        General: Normal range of motion.     Cervical back: Normal range of motion.  Skin:    General: Skin is warm and dry.  Neurological:     Mental Status: She is alert and oriented to person, place, and time.     Coordination: Coordination normal.  Psychiatric:        Behavior: Behavior normal. Behavior is cooperative.        Thought Content: Thought content normal.        Judgment: Judgment normal.          Patient has been counseled extensively about nutrition and exercise as well as the importance of adherence with medications and regular follow-up. The patient was given clear instructions to go to ER or  return to medical center if symptoms don't improve, worsen or new problems develop. The patient verbalized understanding.   Follow-up: Return in about 3 months (around 08/22/2024).   Haze LELON Servant, FNP-BC [facility] [place], [place] [phone]   05/19/2024, 12:52 PM     [1] No Known Allergies  "

## 2024-05-19 NOTE — Progress Notes (Deleted)
 Would like to start semigultides.

## 2024-05-19 NOTE — Patient Instructions (Signed)
 WEGOVY  ZEPBOUND RYBELSUS  MOUNJARO

## 2024-05-20 LAB — CMP14+EGFR
ALT: 16 IU/L (ref 0–32)
AST: 20 IU/L (ref 0–40)
Albumin: 4.3 g/dL (ref 3.8–4.9)
Alkaline Phosphatase: 83 IU/L (ref 49–135)
BUN/Creatinine Ratio: 14 (ref 9–23)
BUN: 23 mg/dL (ref 6–24)
Bilirubin Total: <0.2 mg/dL (ref 0.0–1.2)
CO2: 23 mmol/L (ref 20–29)
Calcium: 9.1 mg/dL (ref 8.7–10.2)
Chloride: 100 mmol/L (ref 96–106)
Creatinine, Ser: 1.64 mg/dL — ABNORMAL HIGH (ref 0.57–1.00)
Globulin, Total: 2.9 g/dL (ref 1.5–4.5)
Glucose: 78 mg/dL (ref 70–99)
Potassium: 4 mmol/L (ref 3.5–5.2)
Sodium: 142 mmol/L (ref 134–144)
Total Protein: 7.2 g/dL (ref 6.0–8.5)
eGFR: 36 mL/min/1.73 — ABNORMAL LOW

## 2024-05-20 LAB — CERVICOVAGINAL ANCILLARY ONLY
Bacterial Vaginitis (gardnerella): POSITIVE — AB
Candida Glabrata: NEGATIVE
Candida Vaginitis: NEGATIVE
Chlamydia: NEGATIVE
Comment: NEGATIVE
Comment: NEGATIVE
Comment: NEGATIVE
Comment: NEGATIVE
Comment: NEGATIVE
Comment: NORMAL
Neisseria Gonorrhea: NEGATIVE
Trichomonas: NEGATIVE

## 2024-05-20 LAB — HEMOGLOBIN A1C
Est. average glucose Bld gHb Est-mCnc: 117 mg/dL
Hgb A1c MFr Bld: 5.7 % — ABNORMAL HIGH (ref 4.8–5.6)

## 2024-05-20 MED ORDER — METRONIDAZOLE 500 MG PO TABS: 500.0000 mg | ORAL_TABLET | Freq: Two times a day (BID) | ORAL | 0 refills | Status: AC

## 2024-05-20 MED ORDER — AMLODIPINE BESYLATE 10 MG PO TABS: 10.0000 mg | ORAL_TABLET | Freq: Every day | ORAL | 1 refills | Status: AC

## 2024-06-02 ENCOUNTER — Ambulatory Visit: Payer: Self-pay | Attending: Nurse Practitioner

## 2024-06-02 DIAGNOSIS — I10 Essential (primary) hypertension: Secondary | ICD-10-CM

## 2024-06-03 ENCOUNTER — Ambulatory Visit: Payer: Self-pay | Admitting: Nurse Practitioner

## 2024-06-03 LAB — CMP14+EGFR
ALT: 22 IU/L (ref 0–32)
AST: 22 IU/L (ref 0–40)
Albumin: 4.1 g/dL (ref 3.8–4.9)
Alkaline Phosphatase: 62 IU/L (ref 49–135)
BUN/Creatinine Ratio: 15 (ref 9–23)
BUN: 22 mg/dL (ref 6–24)
Bilirubin Total: 0.3 mg/dL (ref 0.0–1.2)
CO2: 22 mmol/L (ref 20–29)
Calcium: 9.3 mg/dL (ref 8.7–10.2)
Chloride: 106 mmol/L (ref 96–106)
Creatinine, Ser: 1.43 mg/dL — ABNORMAL HIGH (ref 0.57–1.00)
Globulin, Total: 2.9 g/dL (ref 1.5–4.5)
Glucose: 74 mg/dL (ref 70–99)
Potassium: 4.8 mmol/L (ref 3.5–5.2)
Sodium: 143 mmol/L (ref 134–144)
Total Protein: 7 g/dL (ref 6.0–8.5)
eGFR: 42 mL/min/1.73 — ABNORMAL LOW

## 2024-06-09 ENCOUNTER — Telehealth: Payer: Self-pay | Admitting: Nurse Practitioner

## 2024-06-09 ENCOUNTER — Other Ambulatory Visit: Payer: Self-pay | Admitting: Nurse Practitioner

## 2024-06-09 DIAGNOSIS — B9689 Other specified bacterial agents as the cause of diseases classified elsewhere: Secondary | ICD-10-CM

## 2024-06-09 MED ORDER — METRONIDAZOLE 500 MG PO TABS: 500.0000 mg | ORAL_TABLET | Freq: Two times a day (BID) | ORAL | 0 refills | Status: AC

## 2024-06-09 NOTE — Telephone Encounter (Signed)
 Copied from CRM #[id number]. Topic: General - Other >> Jun 09, 2024 11:07 AM   Zebedee SAUNDERS wrote:  Reason for CRM: Pt states Bacterial vaginosis has not gone, pt would like medication sent to pharmacy [id number] [place], [place] - [address] [place] [address] Phone: [phone] Fax: [phone] , Please call pt at  [phone]

## 2024-06-09 NOTE — Telephone Encounter (Signed)
 Will send metronidazole . If no improvement she needs an office visit. 1110 or 130 DB

## 2024-06-10 NOTE — Telephone Encounter (Signed)
 Patient identified by name and date of birth.  Patient aware of response from provider and voiced understanding.

## 2024-08-11 ENCOUNTER — Ambulatory Visit: Payer: Self-pay | Admitting: Nurse Practitioner
